# Patient Record
Sex: Female | Born: 1935 | Race: White | Hispanic: No | Smoking: Never smoker
Health system: Southern US, Community
[De-identification: ages and names within clinical notes are randomized; demographics above are authoritative.]

## PROBLEM LIST (undated history)

## (undated) DIAGNOSIS — E785 Hyperlipidemia, unspecified: Secondary | ICD-10-CM

## (undated) DIAGNOSIS — I351 Nonrheumatic aortic (valve) insufficiency: Secondary | ICD-10-CM

## (undated) DIAGNOSIS — G3183 Dementia with Lewy bodies: Secondary | ICD-10-CM

## (undated) DIAGNOSIS — C8589 Other specified types of non-Hodgkin lymphoma, extranodal and solid organ sites: Secondary | ICD-10-CM

## (undated) DIAGNOSIS — C8339 Primary central nervous system lymphoma: Secondary | ICD-10-CM

## (undated) DIAGNOSIS — Z8719 Personal history of other diseases of the digestive system: Secondary | ICD-10-CM

## (undated) DIAGNOSIS — K219 Gastro-esophageal reflux disease without esophagitis: Secondary | ICD-10-CM

## (undated) DIAGNOSIS — R05 Cough: Secondary | ICD-10-CM

## (undated) DIAGNOSIS — Z9181 History of falling: Secondary | ICD-10-CM

## (undated) DIAGNOSIS — Z8639 Personal history of other endocrine, nutritional and metabolic disease: Secondary | ICD-10-CM

## (undated) DIAGNOSIS — E46 Unspecified protein-calorie malnutrition: Secondary | ICD-10-CM

## (undated) DIAGNOSIS — F0280 Dementia in other diseases classified elsewhere without behavioral disturbance: Secondary | ICD-10-CM

## (undated) DIAGNOSIS — C719 Malignant neoplasm of brain, unspecified: Secondary | ICD-10-CM

## (undated) DIAGNOSIS — I639 Cerebral infarction, unspecified: Secondary | ICD-10-CM

## (undated) DIAGNOSIS — I428 Other cardiomyopathies: Secondary | ICD-10-CM

## (undated) DIAGNOSIS — I10 Essential (primary) hypertension: Secondary | ICD-10-CM

## (undated) DIAGNOSIS — R059 Cough, unspecified: Secondary | ICD-10-CM

## (undated) DIAGNOSIS — N289 Disorder of kidney and ureter, unspecified: Secondary | ICD-10-CM

## (undated) DIAGNOSIS — F4321 Adjustment disorder with depressed mood: Secondary | ICD-10-CM

## (undated) DIAGNOSIS — N390 Urinary tract infection, site not specified: Secondary | ICD-10-CM

## (undated) DIAGNOSIS — R5381 Other malaise: Secondary | ICD-10-CM

## (undated) DIAGNOSIS — G459 Transient cerebral ischemic attack, unspecified: Secondary | ICD-10-CM

## (undated) HISTORY — DX: Primary central nervous system lymphoma: C83.390

## (undated) HISTORY — DX: Other specified types of non-hodgkin lymphoma, extranodal and solid organ sites: C85.89

## (undated) HISTORY — PX: OTHER SURGICAL HISTORY: SHX169

## (undated) HISTORY — DX: Disorder of kidney and ureter, unspecified: N28.9

## (undated) HISTORY — DX: Cough, unspecified: R05.9

## (undated) HISTORY — DX: Unspecified protein-calorie malnutrition: E46

## (undated) HISTORY — DX: Other cardiomyopathies: I42.8

## (undated) HISTORY — DX: Other malaise: R53.81

## (undated) HISTORY — DX: Cough: R05

## (undated) HISTORY — DX: Transient cerebral ischemic attack, unspecified: G45.9

## (undated) HISTORY — DX: Adjustment disorder with depressed mood: F43.21

## (undated) HISTORY — PX: TONSILLECTOMY: SUR1361

## (undated) HISTORY — DX: Gastro-esophageal reflux disease without esophagitis: K21.9

## (undated) HISTORY — DX: Cerebral infarction, unspecified: I63.9

## (undated) HISTORY — DX: Personal history of other diseases of the digestive system: Z87.19

## (undated) HISTORY — DX: Hyperlipidemia, unspecified: E78.5

## (undated) HISTORY — DX: Malignant neoplasm of brain, unspecified: C71.9

## (undated) HISTORY — DX: Urinary tract infection, site not specified: N39.0

## (undated) HISTORY — DX: Personal history of other endocrine, nutritional and metabolic disease: Z86.39

## (undated) HISTORY — DX: Dementia in other diseases classified elsewhere, unspecified severity, without behavioral disturbance, psychotic disturbance, mood disturbance, and anxiety: F02.80

## (undated) HISTORY — DX: Nonrheumatic aortic (valve) insufficiency: I35.1

## (undated) HISTORY — DX: Essential (primary) hypertension: I10

## (undated) HISTORY — DX: Dementia with Lewy bodies: G31.83

## (undated) HISTORY — DX: History of falling: Z91.81

---

## 1997-11-08 ENCOUNTER — Emergency Department (HOSPITAL_COMMUNITY): Admission: EM | Admit: 1997-11-08 | Discharge: 1997-11-08 | Payer: Self-pay | Admitting: Emergency Medicine

## 1998-11-26 ENCOUNTER — Emergency Department (HOSPITAL_COMMUNITY): Admission: EM | Admit: 1998-11-26 | Discharge: 1998-11-26 | Payer: Self-pay | Admitting: Emergency Medicine

## 1999-01-31 ENCOUNTER — Ambulatory Visit (HOSPITAL_COMMUNITY): Admission: RE | Admit: 1999-01-31 | Discharge: 1999-01-31 | Payer: Self-pay | Admitting: Internal Medicine

## 1999-01-31 ENCOUNTER — Encounter: Payer: Self-pay | Admitting: Internal Medicine

## 2001-07-02 ENCOUNTER — Other Ambulatory Visit: Admission: RE | Admit: 2001-07-02 | Discharge: 2001-07-02 | Payer: Self-pay | Admitting: *Deleted

## 2004-04-12 ENCOUNTER — Ambulatory Visit: Payer: Self-pay | Admitting: Internal Medicine

## 2004-10-14 ENCOUNTER — Ambulatory Visit: Payer: Self-pay | Admitting: Family Medicine

## 2004-10-21 ENCOUNTER — Ambulatory Visit: Payer: Self-pay | Admitting: Family Medicine

## 2004-10-28 ENCOUNTER — Ambulatory Visit: Payer: Self-pay | Admitting: Cardiology

## 2004-11-10 ENCOUNTER — Ambulatory Visit: Payer: Self-pay

## 2005-01-23 HISTORY — PX: AORTIC VALVE REPLACEMENT: SHX41

## 2005-02-06 ENCOUNTER — Ambulatory Visit: Payer: Self-pay | Admitting: Cardiology

## 2005-02-10 ENCOUNTER — Ambulatory Visit: Payer: Self-pay | Admitting: Cardiology

## 2005-02-17 ENCOUNTER — Ambulatory Visit: Payer: Self-pay | Admitting: Cardiology

## 2005-02-21 ENCOUNTER — Ambulatory Visit: Payer: Self-pay

## 2005-03-27 ENCOUNTER — Ambulatory Visit: Payer: Self-pay | Admitting: Internal Medicine

## 2005-03-29 ENCOUNTER — Ambulatory Visit: Payer: Self-pay | Admitting: Gastroenterology

## 2005-03-30 ENCOUNTER — Ambulatory Visit: Payer: Self-pay | Admitting: Gastroenterology

## 2005-04-03 ENCOUNTER — Ambulatory Visit: Payer: Self-pay | Admitting: Gastroenterology

## 2005-04-19 ENCOUNTER — Ambulatory Visit: Payer: Self-pay | Admitting: Gastroenterology

## 2005-04-25 ENCOUNTER — Encounter: Admission: RE | Admit: 2005-04-25 | Discharge: 2005-04-25 | Payer: Self-pay | Admitting: Internal Medicine

## 2005-05-05 ENCOUNTER — Ambulatory Visit: Payer: Self-pay | Admitting: Internal Medicine

## 2005-05-10 ENCOUNTER — Ambulatory Visit: Payer: Self-pay | Admitting: *Deleted

## 2005-05-15 ENCOUNTER — Ambulatory Visit: Payer: Self-pay | Admitting: Internal Medicine

## 2005-05-20 ENCOUNTER — Encounter: Admission: RE | Admit: 2005-05-20 | Discharge: 2005-05-20 | Payer: Self-pay | Admitting: Internal Medicine

## 2005-05-22 ENCOUNTER — Ambulatory Visit: Payer: Self-pay | Admitting: Internal Medicine

## 2005-05-29 ENCOUNTER — Ambulatory Visit: Payer: Self-pay | Admitting: Internal Medicine

## 2005-06-23 ENCOUNTER — Ambulatory Visit: Payer: Self-pay | Admitting: Internal Medicine

## 2005-06-26 ENCOUNTER — Ambulatory Visit: Payer: Self-pay

## 2005-06-26 ENCOUNTER — Encounter: Payer: Self-pay | Admitting: Internal Medicine

## 2005-07-18 ENCOUNTER — Ambulatory Visit: Payer: Self-pay | Admitting: Internal Medicine

## 2005-08-02 ENCOUNTER — Ambulatory Visit: Payer: Self-pay | Admitting: *Deleted

## 2005-08-07 ENCOUNTER — Ambulatory Visit (HOSPITAL_COMMUNITY): Admission: RE | Admit: 2005-08-07 | Discharge: 2005-08-07 | Payer: Self-pay | Admitting: *Deleted

## 2005-08-07 ENCOUNTER — Ambulatory Visit: Payer: Self-pay | Admitting: Internal Medicine

## 2005-08-28 ENCOUNTER — Ambulatory Visit: Payer: Self-pay | Admitting: *Deleted

## 2005-08-29 ENCOUNTER — Ambulatory Visit: Payer: Self-pay | Admitting: Gastroenterology

## 2005-09-11 ENCOUNTER — Ambulatory Visit: Payer: Self-pay | Admitting: *Deleted

## 2005-09-18 ENCOUNTER — Ambulatory Visit: Payer: Self-pay | Admitting: Gastroenterology

## 2005-09-19 ENCOUNTER — Ambulatory Visit: Payer: Self-pay | Admitting: Gastroenterology

## 2005-09-21 ENCOUNTER — Ambulatory Visit (HOSPITAL_COMMUNITY): Admission: RE | Admit: 2005-09-21 | Discharge: 2005-09-21 | Payer: Self-pay | Admitting: Gastroenterology

## 2005-09-29 ENCOUNTER — Ambulatory Visit: Payer: Self-pay | Admitting: Gastroenterology

## 2005-10-12 ENCOUNTER — Inpatient Hospital Stay (HOSPITAL_COMMUNITY): Admission: EM | Admit: 2005-10-12 | Discharge: 2005-10-14 | Payer: Self-pay | Admitting: Emergency Medicine

## 2005-10-12 ENCOUNTER — Ambulatory Visit: Payer: Self-pay | Admitting: *Deleted

## 2005-10-13 ENCOUNTER — Encounter: Payer: Self-pay | Admitting: Cardiology

## 2005-10-14 ENCOUNTER — Ambulatory Visit: Payer: Self-pay | Admitting: Internal Medicine

## 2005-10-23 ENCOUNTER — Ambulatory Visit: Payer: Self-pay | Admitting: *Deleted

## 2005-10-23 DIAGNOSIS — I351 Nonrheumatic aortic (valve) insufficiency: Secondary | ICD-10-CM

## 2005-10-23 HISTORY — DX: Nonrheumatic aortic (valve) insufficiency: I35.1

## 2005-10-30 ENCOUNTER — Ambulatory Visit: Payer: Self-pay | Admitting: Cardiovascular Disease

## 2005-10-31 ENCOUNTER — Ambulatory Visit: Payer: Self-pay | Admitting: Internal Medicine

## 2005-11-01 ENCOUNTER — Ambulatory Visit: Payer: Self-pay | Admitting: Internal Medicine

## 2005-11-01 ENCOUNTER — Ambulatory Visit (HOSPITAL_COMMUNITY): Admission: RE | Admit: 2005-11-01 | Discharge: 2005-11-01 | Payer: Self-pay | Admitting: Internal Medicine

## 2005-11-01 ENCOUNTER — Encounter: Payer: Self-pay | Admitting: Internal Medicine

## 2005-11-02 ENCOUNTER — Ambulatory Visit (HOSPITAL_COMMUNITY): Admission: RE | Admit: 2005-11-02 | Discharge: 2005-11-02 | Payer: Self-pay | Admitting: Gastroenterology

## 2005-11-07 ENCOUNTER — Ambulatory Visit: Payer: Self-pay | Admitting: Internal Medicine

## 2005-11-07 ENCOUNTER — Inpatient Hospital Stay (HOSPITAL_COMMUNITY): Admission: AD | Admit: 2005-11-07 | Discharge: 2005-11-14 | Payer: Self-pay | Admitting: Internal Medicine

## 2005-11-07 ENCOUNTER — Inpatient Hospital Stay (HOSPITAL_BASED_OUTPATIENT_CLINIC_OR_DEPARTMENT_OTHER): Admission: RE | Admit: 2005-11-07 | Discharge: 2005-11-07 | Payer: Self-pay | Admitting: Internal Medicine

## 2005-11-08 ENCOUNTER — Encounter: Payer: Self-pay | Admitting: Cardiothoracic Surgery

## 2005-11-22 ENCOUNTER — Ambulatory Visit: Payer: Self-pay | Admitting: *Deleted

## 2005-11-29 ENCOUNTER — Ambulatory Visit: Payer: Self-pay | Admitting: *Deleted

## 2005-12-01 ENCOUNTER — Encounter: Admission: RE | Admit: 2005-12-01 | Discharge: 2005-12-01 | Payer: Self-pay | Admitting: Cardiothoracic Surgery

## 2005-12-07 ENCOUNTER — Ambulatory Visit: Payer: Self-pay

## 2005-12-07 ENCOUNTER — Ambulatory Visit: Payer: Self-pay | Admitting: *Deleted

## 2005-12-07 ENCOUNTER — Encounter: Payer: Self-pay | Admitting: Cardiology

## 2005-12-12 ENCOUNTER — Encounter (HOSPITAL_COMMUNITY): Admission: RE | Admit: 2005-12-12 | Discharge: 2006-03-12 | Payer: Self-pay | Admitting: *Deleted

## 2005-12-21 ENCOUNTER — Ambulatory Visit: Payer: Self-pay | Admitting: *Deleted

## 2005-12-21 ENCOUNTER — Encounter: Payer: Self-pay | Admitting: Cardiology

## 2005-12-21 ENCOUNTER — Ambulatory Visit: Payer: Self-pay

## 2006-01-01 ENCOUNTER — Ambulatory Visit: Payer: Self-pay

## 2006-01-01 ENCOUNTER — Encounter: Payer: Self-pay | Admitting: Cardiology

## 2006-01-01 ENCOUNTER — Ambulatory Visit: Payer: Self-pay | Admitting: Cardiology

## 2006-01-09 ENCOUNTER — Ambulatory Visit: Payer: Self-pay | Admitting: *Deleted

## 2006-01-10 ENCOUNTER — Ambulatory Visit: Payer: Self-pay | Admitting: Gastroenterology

## 2006-02-01 ENCOUNTER — Encounter: Admission: RE | Admit: 2006-02-01 | Discharge: 2006-02-01 | Payer: Self-pay | Admitting: Cardiothoracic Surgery

## 2006-02-13 ENCOUNTER — Ambulatory Visit: Payer: Self-pay | Admitting: *Deleted

## 2006-02-20 ENCOUNTER — Ambulatory Visit: Payer: Self-pay | Admitting: Gastroenterology

## 2006-03-12 ENCOUNTER — Ambulatory Visit: Payer: Self-pay | Admitting: Internal Medicine

## 2006-03-12 ENCOUNTER — Inpatient Hospital Stay (HOSPITAL_COMMUNITY): Admission: EM | Admit: 2006-03-12 | Discharge: 2006-03-17 | Payer: Self-pay | Admitting: Emergency Medicine

## 2006-03-12 ENCOUNTER — Ambulatory Visit: Payer: Self-pay | Admitting: Cardiology

## 2006-03-13 ENCOUNTER — Encounter: Payer: Self-pay | Admitting: Internal Medicine

## 2006-03-13 ENCOUNTER — Encounter: Payer: Self-pay | Admitting: Cardiology

## 2006-03-13 ENCOUNTER — Encounter (INDEPENDENT_AMBULATORY_CARE_PROVIDER_SITE_OTHER): Payer: Self-pay | Admitting: *Deleted

## 2006-03-13 ENCOUNTER — Ambulatory Visit: Payer: Self-pay | Admitting: Cardiothoracic Surgery

## 2006-03-13 LAB — CONVERTED CEMR LAB
Hemoglobin, Urine: NEGATIVE
Nitrite: NEGATIVE
Urobilinogen, UA: 0.2 (ref 0.0–1.0)

## 2006-03-14 ENCOUNTER — Encounter: Payer: Self-pay | Admitting: Internal Medicine

## 2006-03-14 ENCOUNTER — Encounter (INDEPENDENT_AMBULATORY_CARE_PROVIDER_SITE_OTHER): Payer: Self-pay | Admitting: Specialist

## 2006-03-29 ENCOUNTER — Ambulatory Visit: Payer: Self-pay

## 2006-03-29 ENCOUNTER — Ambulatory Visit: Payer: Self-pay | Admitting: *Deleted

## 2006-03-29 ENCOUNTER — Encounter: Payer: Self-pay | Admitting: Cardiology

## 2006-04-02 ENCOUNTER — Ambulatory Visit: Payer: Self-pay | Admitting: *Deleted

## 2006-04-02 ENCOUNTER — Ambulatory Visit: Payer: Self-pay | Admitting: Gastroenterology

## 2006-04-02 LAB — CONVERTED CEMR LAB
CO2: 34 meq/L — ABNORMAL HIGH (ref 19–32)
Chloride: 96 meq/L (ref 96–112)
Glucose, Bld: 169 mg/dL — ABNORMAL HIGH (ref 70–99)

## 2006-04-03 ENCOUNTER — Ambulatory Visit: Payer: Self-pay | Admitting: Internal Medicine

## 2006-04-04 ENCOUNTER — Ambulatory Visit: Payer: Self-pay | Admitting: Cardiothoracic Surgery

## 2006-04-06 ENCOUNTER — Encounter: Admission: RE | Admit: 2006-04-06 | Discharge: 2006-04-06 | Payer: Self-pay | Admitting: Cardiothoracic Surgery

## 2006-04-09 ENCOUNTER — Ambulatory Visit: Payer: Self-pay | Admitting: Cardiology

## 2006-04-09 ENCOUNTER — Inpatient Hospital Stay (HOSPITAL_COMMUNITY): Admission: RE | Admit: 2006-04-09 | Discharge: 2006-04-13 | Payer: Self-pay | Admitting: Cardiothoracic Surgery

## 2006-04-25 ENCOUNTER — Ambulatory Visit: Payer: Self-pay | Admitting: *Deleted

## 2006-04-25 LAB — CONVERTED CEMR LAB
BUN: 24 mg/dL — ABNORMAL HIGH (ref 6–23)
CO2: 31 meq/L (ref 19–32)
Calcium: 9.1 mg/dL (ref 8.4–10.5)
Chloride: 100 meq/L (ref 96–112)
Glucose, Bld: 110 mg/dL — ABNORMAL HIGH (ref 70–99)

## 2006-05-04 DIAGNOSIS — I10 Essential (primary) hypertension: Secondary | ICD-10-CM

## 2006-05-22 ENCOUNTER — Ambulatory Visit: Payer: Self-pay | Admitting: *Deleted

## 2006-05-22 ENCOUNTER — Encounter: Payer: Self-pay | Admitting: Cardiology

## 2006-05-22 ENCOUNTER — Ambulatory Visit: Payer: Self-pay

## 2006-05-24 ENCOUNTER — Ambulatory Visit: Payer: Self-pay | Admitting: *Deleted

## 2006-05-24 LAB — CONVERTED CEMR LAB
BUN: 19 mg/dL (ref 6–23)
CO2: 32 meq/L (ref 19–32)
Calcium: 9.2 mg/dL (ref 8.4–10.5)
Chloride: 101 meq/L (ref 96–112)
GFR calc Af Amer: 61 mL/min
GFR calc non Af Amer: 50 mL/min
Glucose, Bld: 83 mg/dL (ref 70–99)
Hemoglobin: 12.2 g/dL (ref 12.0–15.0)
Pro B Natriuretic peptide (BNP): 603 pg/mL — ABNORMAL HIGH (ref 0.0–100.0)

## 2006-05-31 ENCOUNTER — Ambulatory Visit: Payer: Self-pay | Admitting: Internal Medicine

## 2006-06-07 ENCOUNTER — Ambulatory Visit: Payer: Self-pay | Admitting: Cardiothoracic Surgery

## 2006-06-27 ENCOUNTER — Ambulatory Visit: Payer: Self-pay | Admitting: *Deleted

## 2006-06-27 LAB — CONVERTED CEMR LAB
BUN: 32 mg/dL — ABNORMAL HIGH (ref 6–23)
Chloride: 102 meq/L (ref 96–112)
Creatinine, Ser: 1.3 mg/dL — ABNORMAL HIGH (ref 0.4–1.2)
GFR calc non Af Amer: 41 mL/min
HCT: 34.4 % — ABNORMAL LOW (ref 36.0–46.0)
Hemoglobin: 11.8 g/dL — ABNORMAL LOW (ref 12.0–15.0)
Potassium: 4.6 meq/L (ref 3.5–5.1)
Sodium: 136 meq/L (ref 135–145)

## 2006-06-29 ENCOUNTER — Ambulatory Visit: Payer: Self-pay | Admitting: Internal Medicine

## 2006-06-29 DIAGNOSIS — D518 Other vitamin B12 deficiency anemias: Secondary | ICD-10-CM

## 2006-07-24 ENCOUNTER — Ambulatory Visit: Payer: Self-pay | Admitting: *Deleted

## 2006-07-24 LAB — CONVERTED CEMR LAB
Chloride: 110 meq/L (ref 96–112)
GFR calc Af Amer: 55 mL/min
GFR calc non Af Amer: 45 mL/min
Glucose, Bld: 98 mg/dL (ref 70–99)
Sodium: 139 meq/L (ref 135–145)

## 2006-07-25 ENCOUNTER — Encounter: Payer: Self-pay | Admitting: Cardiology

## 2006-07-25 ENCOUNTER — Ambulatory Visit: Payer: Self-pay | Admitting: Internal Medicine

## 2006-07-25 ENCOUNTER — Ambulatory Visit: Payer: Self-pay

## 2006-08-07 ENCOUNTER — Ambulatory Visit: Payer: Self-pay | Admitting: Internal Medicine

## 2006-08-30 ENCOUNTER — Ambulatory Visit: Payer: Self-pay | Admitting: Cardiothoracic Surgery

## 2006-09-07 ENCOUNTER — Ambulatory Visit: Payer: Self-pay | Admitting: Internal Medicine

## 2006-09-25 ENCOUNTER — Ambulatory Visit: Payer: Self-pay | Admitting: Internal Medicine

## 2006-09-25 DIAGNOSIS — I359 Nonrheumatic aortic valve disorder, unspecified: Secondary | ICD-10-CM | POA: Insufficient documentation

## 2006-09-25 DIAGNOSIS — K649 Unspecified hemorrhoids: Secondary | ICD-10-CM | POA: Insufficient documentation

## 2006-09-28 LAB — CONVERTED CEMR LAB
Basophils Relative: 0.7 % (ref 0.0–1.0)
Eosinophils Relative: 6.6 % — ABNORMAL HIGH (ref 0.0–5.0)
Monocytes Relative: 5.8 % (ref 3.0–11.0)
Neutro Abs: 3.9 10*3/uL (ref 1.4–7.7)
Platelets: 249 10*3/uL (ref 150–400)
RBC: 3.6 M/uL — ABNORMAL LOW (ref 3.87–5.11)
RDW: 13.4 % (ref 11.5–14.6)
Vitamin B-12: 473 pg/mL (ref 211–911)
WBC: 5.1 10*3/uL (ref 4.5–10.5)

## 2006-10-05 ENCOUNTER — Ambulatory Visit: Payer: Self-pay | Admitting: Internal Medicine

## 2006-10-22 ENCOUNTER — Telehealth: Payer: Self-pay | Admitting: Internal Medicine

## 2006-10-23 ENCOUNTER — Ambulatory Visit: Payer: Self-pay | Admitting: Internal Medicine

## 2006-10-24 ENCOUNTER — Telehealth: Payer: Self-pay | Admitting: Internal Medicine

## 2006-10-24 ENCOUNTER — Encounter: Payer: Self-pay | Admitting: Internal Medicine

## 2006-10-24 LAB — CONVERTED CEMR LAB
ALT: 15 units/L (ref 0–35)
AST: 20 units/L (ref 0–37)
Amylase: 87 units/L (ref 27–131)
Basophils Relative: 0 % (ref 0.0–1.0)
Bilirubin, Direct: 0.2 mg/dL (ref 0.0–0.3)
CO2: 33 meq/L — ABNORMAL HIGH (ref 19–32)
Calcium: 10.2 mg/dL (ref 8.4–10.5)
Eosinophils Relative: 5.4 % — ABNORMAL HIGH (ref 0.0–5.0)
GFR calc Af Amer: 61 mL/min
Glucose, Bld: 111 mg/dL — ABNORMAL HIGH (ref 70–99)
HCT: 37.9 % (ref 36.0–46.0)
Hemoglobin: 12.8 g/dL (ref 12.0–15.0)
Lymphocytes Relative: 17.2 % (ref 12.0–46.0)
MCV: 97.4 fL (ref 78.0–100.0)
Neutro Abs: 3.7 10*3/uL (ref 1.4–7.7)
Neutrophils Relative %: 65.4 % (ref 43.0–77.0)
Platelets: 225 10*3/uL (ref 150–400)
Sodium: 141 meq/L (ref 135–145)
Total Protein: 7.5 g/dL (ref 6.0–8.3)
WBC: 5.7 10*3/uL (ref 4.5–10.5)

## 2006-10-26 ENCOUNTER — Encounter: Admission: RE | Admit: 2006-10-26 | Discharge: 2006-10-26 | Payer: Self-pay | Admitting: Internal Medicine

## 2006-11-08 ENCOUNTER — Ambulatory Visit: Payer: Self-pay | Admitting: Pulmonary Disease

## 2006-11-12 ENCOUNTER — Encounter: Admission: RE | Admit: 2006-11-12 | Discharge: 2006-11-12 | Payer: Self-pay | Admitting: Internal Medicine

## 2006-11-13 ENCOUNTER — Ambulatory Visit: Payer: Self-pay | Admitting: Gastroenterology

## 2006-11-19 ENCOUNTER — Telehealth (INDEPENDENT_AMBULATORY_CARE_PROVIDER_SITE_OTHER): Payer: Self-pay | Admitting: *Deleted

## 2006-11-29 ENCOUNTER — Ambulatory Visit (HOSPITAL_COMMUNITY): Admission: RE | Admit: 2006-11-29 | Discharge: 2006-11-29 | Payer: Self-pay | Admitting: Gastroenterology

## 2006-11-29 ENCOUNTER — Encounter: Payer: Self-pay | Admitting: Gastroenterology

## 2006-11-29 ENCOUNTER — Encounter: Payer: Self-pay | Admitting: Internal Medicine

## 2006-12-04 ENCOUNTER — Ambulatory Visit: Payer: Self-pay | Admitting: Gastroenterology

## 2006-12-06 ENCOUNTER — Ambulatory Visit: Payer: Self-pay | Admitting: Gastroenterology

## 2006-12-06 LAB — CONVERTED CEMR LAB: BUN: 28 mg/dL — ABNORMAL HIGH (ref 6–23)

## 2006-12-09 ENCOUNTER — Encounter: Admission: RE | Admit: 2006-12-09 | Discharge: 2006-12-09 | Payer: Self-pay | Admitting: Gastroenterology

## 2007-01-18 ENCOUNTER — Encounter: Payer: Self-pay | Admitting: Internal Medicine

## 2007-01-21 ENCOUNTER — Encounter: Payer: Self-pay | Admitting: Internal Medicine

## 2007-01-28 ENCOUNTER — Encounter: Payer: Self-pay | Admitting: Internal Medicine

## 2007-02-27 ENCOUNTER — Ambulatory Visit: Payer: Self-pay | Admitting: Internal Medicine

## 2007-02-27 LAB — CONVERTED CEMR LAB
Chloride: 100 meq/L (ref 96–112)
Creatinine, Ser: 1.3 mg/dL — ABNORMAL HIGH (ref 0.4–1.2)
Glucose, Bld: 98 mg/dL (ref 70–99)
Potassium: 4.5 meq/L (ref 3.5–5.1)
Sodium: 138 meq/L (ref 135–145)

## 2007-03-12 ENCOUNTER — Ambulatory Visit: Payer: Self-pay | Admitting: Internal Medicine

## 2007-03-13 ENCOUNTER — Ambulatory Visit: Payer: Self-pay

## 2007-03-13 ENCOUNTER — Encounter: Payer: Self-pay | Admitting: Internal Medicine

## 2007-03-15 ENCOUNTER — Telehealth (INDEPENDENT_AMBULATORY_CARE_PROVIDER_SITE_OTHER): Payer: Self-pay | Admitting: *Deleted

## 2007-03-18 DIAGNOSIS — F411 Generalized anxiety disorder: Secondary | ICD-10-CM | POA: Insufficient documentation

## 2007-03-18 DIAGNOSIS — D510 Vitamin B12 deficiency anemia due to intrinsic factor deficiency: Secondary | ICD-10-CM

## 2007-03-18 DIAGNOSIS — K589 Irritable bowel syndrome without diarrhea: Secondary | ICD-10-CM

## 2007-03-18 DIAGNOSIS — R0602 Shortness of breath: Secondary | ICD-10-CM

## 2007-03-18 DIAGNOSIS — F329 Major depressive disorder, single episode, unspecified: Secondary | ICD-10-CM

## 2007-03-18 DIAGNOSIS — E785 Hyperlipidemia, unspecified: Secondary | ICD-10-CM

## 2007-03-28 ENCOUNTER — Encounter: Payer: Self-pay | Admitting: Internal Medicine

## 2007-04-01 ENCOUNTER — Encounter: Payer: Self-pay | Admitting: Internal Medicine

## 2007-04-09 ENCOUNTER — Encounter: Payer: Self-pay | Admitting: Internal Medicine

## 2007-04-09 ENCOUNTER — Encounter: Admission: RE | Admit: 2007-04-09 | Discharge: 2007-07-08 | Payer: Self-pay | Admitting: Internal Medicine

## 2007-04-23 ENCOUNTER — Ambulatory Visit: Payer: Self-pay | Admitting: Critical Care Medicine

## 2007-05-17 ENCOUNTER — Ambulatory Visit: Payer: Self-pay | Admitting: Internal Medicine

## 2007-05-17 ENCOUNTER — Telehealth (INDEPENDENT_AMBULATORY_CARE_PROVIDER_SITE_OTHER): Payer: Self-pay | Admitting: *Deleted

## 2007-06-14 ENCOUNTER — Ambulatory Visit: Payer: Self-pay | Admitting: Internal Medicine

## 2007-08-15 ENCOUNTER — Ambulatory Visit: Payer: Self-pay | Admitting: Internal Medicine

## 2007-09-16 ENCOUNTER — Ambulatory Visit: Payer: Self-pay | Admitting: Internal Medicine

## 2007-09-17 ENCOUNTER — Ambulatory Visit: Payer: Self-pay | Admitting: Internal Medicine

## 2007-09-17 LAB — CONVERTED CEMR LAB
Albumin: 3.6 g/dL (ref 3.5–5.2)
Alkaline Phosphatase: 73 units/L (ref 39–117)
Bilirubin, Direct: 0.1 mg/dL (ref 0.0–0.3)
GFR calc Af Amer: 61 mL/min
GFR calc non Af Amer: 50 mL/min
LDL Cholesterol: 92 mg/dL (ref 0–99)
Potassium: 4.5 meq/L (ref 3.5–5.1)
Sodium: 139 meq/L (ref 135–145)
Total Bilirubin: 1.2 mg/dL (ref 0.3–1.2)
VLDL: 24 mg/dL (ref 0–40)

## 2007-09-19 ENCOUNTER — Ambulatory Visit: Payer: Self-pay | Admitting: Internal Medicine

## 2007-10-31 ENCOUNTER — Ambulatory Visit: Payer: Self-pay | Admitting: Internal Medicine

## 2008-02-13 ENCOUNTER — Ambulatory Visit: Payer: Self-pay | Admitting: Internal Medicine

## 2008-03-06 ENCOUNTER — Ambulatory Visit: Payer: Self-pay | Admitting: Internal Medicine

## 2008-03-23 ENCOUNTER — Ambulatory Visit: Payer: Self-pay | Admitting: Internal Medicine

## 2008-03-23 LAB — CONVERTED CEMR LAB
HCT: 38.1 % (ref 36.0–46.0)
Hemoglobin: 13 g/dL (ref 12.0–15.0)
MCHC: 34.2 g/dL (ref 30.0–36.0)
MCV: 97.8 fL (ref 78.0–100.0)
Monocytes Absolute: 0.6 10*3/uL (ref 0.1–1.0)
Monocytes Relative: 10.3 % (ref 3.0–12.0)
Neutro Abs: 4.1 10*3/uL (ref 1.4–7.7)
RDW: 12.5 % (ref 11.5–14.6)
Vitamin B-12: >1500 pg/mL — ABNORMAL HIGH (ref 211–911)

## 2008-04-11 DIAGNOSIS — J310 Chronic rhinitis: Secondary | ICD-10-CM | POA: Insufficient documentation

## 2008-05-25 ENCOUNTER — Ambulatory Visit: Payer: Self-pay | Admitting: Internal Medicine

## 2008-05-26 ENCOUNTER — Ambulatory Visit: Payer: Self-pay | Admitting: Internal Medicine

## 2008-06-10 ENCOUNTER — Ambulatory Visit: Payer: Self-pay | Admitting: Internal Medicine

## 2008-06-16 ENCOUNTER — Encounter: Payer: Self-pay | Admitting: Internal Medicine

## 2008-06-24 ENCOUNTER — Telehealth: Payer: Self-pay | Admitting: Internal Medicine

## 2008-06-29 ENCOUNTER — Ambulatory Visit: Payer: Self-pay

## 2008-06-29 ENCOUNTER — Encounter: Payer: Self-pay | Admitting: Internal Medicine

## 2008-06-29 ENCOUNTER — Ambulatory Visit: Payer: Self-pay | Admitting: Internal Medicine

## 2008-06-30 ENCOUNTER — Telehealth: Payer: Self-pay | Admitting: Internal Medicine

## 2008-06-30 ENCOUNTER — Ambulatory Visit: Payer: Self-pay | Admitting: Internal Medicine

## 2008-06-30 ENCOUNTER — Ambulatory Visit: Payer: Self-pay | Admitting: Pulmonary Disease

## 2008-06-30 LAB — CONVERTED CEMR LAB
Basophils Absolute: 0 10*3/uL (ref 0.0–0.1)
CO2: 28 meq/L (ref 19–32)
Calcium: 9 mg/dL (ref 8.4–10.5)
Creatinine, Ser: 1.5 mg/dL — ABNORMAL HIGH (ref 0.4–1.2)
Eosinophils Absolute: 0.2 10*3/uL (ref 0.0–0.7)
Eosinophils Relative: 2.9 % (ref 0.0–5.0)
MCHC: 34.8 g/dL (ref 30.0–36.0)
MCV: 98.6 fL (ref 78.0–100.0)
Monocytes Absolute: 0.6 10*3/uL (ref 0.1–1.0)
Neutrophils Relative %: 71 % (ref 43.0–77.0)
Platelets: 192 10*3/uL (ref 150.0–400.0)
WBC: 5.4 10*3/uL (ref 4.5–10.5)

## 2008-07-17 ENCOUNTER — Encounter: Payer: Self-pay | Admitting: Internal Medicine

## 2008-07-20 ENCOUNTER — Ambulatory Visit: Payer: Self-pay | Admitting: Internal Medicine

## 2008-07-20 LAB — CONVERTED CEMR LAB
BUN: 34 mg/dL — ABNORMAL HIGH (ref 6–23)
Chloride: 104 meq/L (ref 96–112)
Creatinine, Ser: 1.2 mg/dL (ref 0.4–1.2)
Glucose, Bld: 97 mg/dL (ref 70–99)

## 2008-07-21 ENCOUNTER — Encounter: Payer: Self-pay | Admitting: Internal Medicine

## 2008-07-21 ENCOUNTER — Encounter: Admission: RE | Admit: 2008-07-21 | Discharge: 2008-07-21 | Payer: Self-pay | Admitting: Interventional Radiology

## 2008-08-17 ENCOUNTER — Encounter: Payer: Self-pay | Admitting: Internal Medicine

## 2008-09-09 ENCOUNTER — Encounter: Payer: Self-pay | Admitting: Internal Medicine

## 2008-09-30 ENCOUNTER — Telehealth: Payer: Self-pay | Admitting: Internal Medicine

## 2008-10-02 ENCOUNTER — Ambulatory Visit: Payer: Self-pay | Admitting: Internal Medicine

## 2008-10-07 ENCOUNTER — Telehealth: Payer: Self-pay | Admitting: Internal Medicine

## 2008-10-13 LAB — CONVERTED CEMR LAB
Alkaline Phosphatase: 67 units/L (ref 39–117)
Bilirubin, Direct: 0.1 mg/dL (ref 0.0–0.3)
Cholesterol: 140 mg/dL (ref 0–200)
Folate: >20 ng/mL
LDL Cholesterol: 85 mg/dL (ref 0–99)
Total Bilirubin: 1.3 mg/dL — ABNORMAL HIGH (ref 0.3–1.2)
Total Protein: 7.1 g/dL (ref 6.0–8.3)

## 2008-10-16 ENCOUNTER — Telehealth: Payer: Self-pay | Admitting: Internal Medicine

## 2008-10-18 ENCOUNTER — Encounter: Payer: Self-pay | Admitting: Internal Medicine

## 2008-10-19 ENCOUNTER — Encounter: Payer: Self-pay | Admitting: Internal Medicine

## 2008-10-22 ENCOUNTER — Ambulatory Visit: Payer: Self-pay | Admitting: Internal Medicine

## 2008-10-22 LAB — CONVERTED CEMR LAB
Basophils Relative: 0.4 % (ref 0.0–3.0)
CO2: 29 meq/L (ref 19–32)
Calcium: 9.2 mg/dL (ref 8.4–10.5)
Chloride: 99 meq/L (ref 96–112)
Eosinophils Absolute: 0.9 10*3/uL — ABNORMAL HIGH (ref 0.0–0.7)
Eosinophils Relative: 12.3 % — ABNORMAL HIGH (ref 0.0–5.0)
Glucose, Bld: 99 mg/dL (ref 70–99)
HCT: 40.5 % (ref 36.0–46.0)
Hemoglobin: 13.9 g/dL (ref 12.0–15.0)
Lymphs Abs: 1.2 10*3/uL (ref 0.7–4.0)
MCHC: 34.3 g/dL (ref 30.0–36.0)
MCV: 100.6 fL — ABNORMAL HIGH (ref 78.0–100.0)
Monocytes Absolute: 0.7 10*3/uL (ref 0.1–1.0)
Neutro Abs: 4.4 10*3/uL (ref 1.4–7.7)
Potassium: 4.4 meq/L (ref 3.5–5.1)
RBC: 4.03 M/uL (ref 3.87–5.11)
Sodium: 136 meq/L (ref 135–145)
WBC: 7.2 10*3/uL (ref 4.5–10.5)

## 2008-10-23 DIAGNOSIS — G459 Transient cerebral ischemic attack, unspecified: Secondary | ICD-10-CM

## 2008-10-23 HISTORY — DX: Transient cerebral ischemic attack, unspecified: G45.9

## 2008-10-30 ENCOUNTER — Ambulatory Visit: Payer: Self-pay

## 2008-10-30 ENCOUNTER — Ambulatory Visit (HOSPITAL_COMMUNITY): Admission: RE | Admit: 2008-10-30 | Discharge: 2008-10-30 | Payer: Self-pay | Admitting: Internal Medicine

## 2008-10-30 ENCOUNTER — Encounter: Payer: Self-pay | Admitting: Internal Medicine

## 2008-10-30 ENCOUNTER — Ambulatory Visit: Payer: Self-pay | Admitting: Cardiology

## 2008-10-30 ENCOUNTER — Ambulatory Visit: Payer: Self-pay | Admitting: Internal Medicine

## 2008-11-02 ENCOUNTER — Encounter: Payer: Self-pay | Admitting: Internal Medicine

## 2008-11-09 ENCOUNTER — Ambulatory Visit: Payer: Self-pay | Admitting: Internal Medicine

## 2008-11-10 ENCOUNTER — Telehealth (INDEPENDENT_AMBULATORY_CARE_PROVIDER_SITE_OTHER): Payer: Self-pay | Admitting: *Deleted

## 2008-11-10 ENCOUNTER — Telehealth: Payer: Self-pay | Admitting: Internal Medicine

## 2008-11-11 ENCOUNTER — Inpatient Hospital Stay (HOSPITAL_COMMUNITY): Admission: EM | Admit: 2008-11-11 | Discharge: 2008-11-14 | Payer: Self-pay | Admitting: Emergency Medicine

## 2008-11-12 ENCOUNTER — Encounter (INDEPENDENT_AMBULATORY_CARE_PROVIDER_SITE_OTHER): Payer: Self-pay | Admitting: Family Medicine

## 2008-12-04 ENCOUNTER — Encounter: Admission: RE | Admit: 2008-12-04 | Discharge: 2009-01-20 | Payer: Self-pay | Admitting: Family Medicine

## 2008-12-19 ENCOUNTER — Encounter: Payer: Self-pay | Admitting: Internal Medicine

## 2009-01-19 ENCOUNTER — Encounter: Payer: Self-pay | Admitting: Internal Medicine

## 2009-02-19 ENCOUNTER — Encounter: Payer: Self-pay | Admitting: Internal Medicine

## 2009-03-02 ENCOUNTER — Ambulatory Visit: Payer: Self-pay | Admitting: Internal Medicine

## 2009-03-22 ENCOUNTER — Encounter: Payer: Self-pay | Admitting: Internal Medicine

## 2009-03-23 ENCOUNTER — Encounter: Payer: Self-pay | Admitting: Internal Medicine

## 2009-03-23 LAB — CONVERTED CEMR LAB
Albumin: 4.2 g/dL (ref 3.5–5.2)
Alkaline Phosphatase: 66 units/L (ref 39–117)
Basophils Absolute: 0 10*3/uL (ref 0.0–0.1)
Basophils Relative: 0.3 % (ref 0.0–3.0)
Bilirubin, Direct: 0.2 mg/dL (ref 0.0–0.3)
CO2: 29 meq/L (ref 19–32)
Calcium: 9.6 mg/dL (ref 8.4–10.5)
Creatinine, Ser: 1.3 mg/dL — ABNORMAL HIGH (ref 0.4–1.2)
Eosinophils Absolute: 0.3 10*3/uL (ref 0.0–0.7)
Free T4: 0.6 ng/dL (ref 0.6–1.6)
GFR calc non Af Amer: 41.1 mL/min (ref >60)
Glucose, Bld: 95 mg/dL (ref 70–99)
Lymphocytes Relative: 14.1 % (ref 12.0–46.0)
MCHC: 34 g/dL (ref 30.0–36.0)
MCV: 100.6 fL — ABNORMAL HIGH (ref 78.0–100.0)
Monocytes Absolute: 0.8 10*3/uL (ref 0.1–1.0)
Neutrophils Relative %: 67.4 % (ref 43.0–77.0)
RBC: 3.92 M/uL (ref 3.87–5.11)
RDW: 11.6 % (ref 11.5–14.6)
Sodium: 139 meq/L (ref 135–145)
Total Protein: 7.6 g/dL (ref 6.0–8.3)

## 2009-04-22 ENCOUNTER — Encounter: Payer: Self-pay | Admitting: Internal Medicine

## 2009-05-23 ENCOUNTER — Encounter: Payer: Self-pay | Admitting: Internal Medicine

## 2009-06-23 ENCOUNTER — Encounter: Payer: Self-pay | Admitting: Internal Medicine

## 2009-07-24 ENCOUNTER — Encounter: Payer: Self-pay | Admitting: Internal Medicine

## 2009-08-24 ENCOUNTER — Encounter: Payer: Self-pay | Admitting: Internal Medicine

## 2009-09-01 ENCOUNTER — Telehealth: Payer: Self-pay | Admitting: Internal Medicine

## 2009-09-07 ENCOUNTER — Encounter: Payer: Self-pay | Admitting: Internal Medicine

## 2009-09-24 ENCOUNTER — Encounter: Payer: Self-pay | Admitting: Internal Medicine

## 2009-10-08 ENCOUNTER — Ambulatory Visit: Payer: Self-pay | Admitting: Internal Medicine

## 2009-10-10 LAB — CONVERTED CEMR LAB
ALT: 10 units/L (ref 0–35)
AST: 18 units/L (ref 0–37)
Alkaline Phosphatase: 68 units/L (ref 39–117)
Basophils Absolute: 0 10*3/uL (ref 0.0–0.1)
Basophils Relative: 0.3 % (ref 0.0–3.0)
Bilirubin, Direct: 0.2 mg/dL (ref 0.0–0.3)
CO2: 28 meq/L (ref 19–32)
Chloride: 101 meq/L (ref 96–112)
Cholesterol: 154 mg/dL (ref 0–200)
Eosinophils Absolute: 0.3 10*3/uL (ref 0.0–0.7)
Folate: >20 ng/mL
LDL Cholesterol: 98 mg/dL (ref 0–99)
Lymphocytes Relative: 17.3 % (ref 12.0–46.0)
MCHC: 33.7 g/dL (ref 30.0–36.0)
MCV: 101.9 fL — ABNORMAL HIGH (ref 78.0–100.0)
Monocytes Absolute: 0.7 10*3/uL (ref 0.1–1.0)
Neutrophils Relative %: 66.4 % (ref 43.0–77.0)
Platelets: 197 10*3/uL (ref 150.0–400.0)
Potassium: 4.6 meq/L (ref 3.5–5.1)
Pro B Natriuretic peptide (BNP): 261.3 pg/mL — ABNORMAL HIGH (ref 0.0–100.0)
RDW: 13 % (ref 11.5–14.6)
Total Bilirubin: 1.1 mg/dL (ref 0.3–1.2)
Total Protein: 7.4 g/dL (ref 6.0–8.3)
Vitamin B-12: >1500 pg/mL — ABNORMAL HIGH (ref 211–911)

## 2009-10-13 ENCOUNTER — Encounter: Payer: Self-pay | Admitting: Internal Medicine

## 2009-10-24 ENCOUNTER — Encounter: Payer: Self-pay | Admitting: Internal Medicine

## 2009-11-15 ENCOUNTER — Telehealth: Payer: Self-pay | Admitting: Internal Medicine

## 2009-11-22 ENCOUNTER — Telehealth: Payer: Self-pay | Admitting: Internal Medicine

## 2009-11-22 ENCOUNTER — Encounter: Payer: Self-pay | Admitting: Internal Medicine

## 2009-11-23 ENCOUNTER — Ambulatory Visit: Payer: Self-pay | Admitting: Physician Assistant

## 2009-11-23 ENCOUNTER — Ambulatory Visit: Payer: Self-pay | Admitting: Internal Medicine

## 2009-11-23 DIAGNOSIS — R269 Unspecified abnormalities of gait and mobility: Secondary | ICD-10-CM

## 2009-11-23 DIAGNOSIS — I5032 Chronic diastolic (congestive) heart failure: Secondary | ICD-10-CM

## 2009-11-24 ENCOUNTER — Encounter: Payer: Self-pay | Admitting: Internal Medicine

## 2009-12-03 ENCOUNTER — Ambulatory Visit: Payer: Self-pay | Admitting: Internal Medicine

## 2009-12-03 LAB — CONVERTED CEMR LAB
Chloride: 100 meq/L (ref 96–112)
Creatinine, Ser: 1.3 mg/dL — ABNORMAL HIGH (ref 0.4–1.2)
Sodium: 136 meq/L (ref 135–145)

## 2009-12-13 ENCOUNTER — Ambulatory Visit: Payer: Self-pay | Admitting: Internal Medicine

## 2009-12-18 ENCOUNTER — Encounter: Payer: Self-pay | Admitting: Internal Medicine

## 2009-12-22 LAB — CONVERTED CEMR LAB
Chloride: 101 meq/L (ref 96–112)
GFR calc non Af Amer: 30.31 mL/min (ref >60)
Glucose, Bld: 114 mg/dL — ABNORMAL HIGH (ref 70–99)
Potassium: 4.2 meq/L (ref 3.5–5.1)
Pro B Natriuretic peptide (BNP): 118 pg/mL — ABNORMAL HIGH (ref 0.0–100.0)
Sodium: 138 meq/L (ref 135–145)

## 2009-12-25 ENCOUNTER — Encounter: Payer: Self-pay | Admitting: Internal Medicine

## 2010-01-24 ENCOUNTER — Encounter: Payer: Self-pay | Admitting: Internal Medicine

## 2010-02-12 ENCOUNTER — Encounter: Payer: Self-pay | Admitting: Gastroenterology

## 2010-02-13 ENCOUNTER — Encounter: Payer: Self-pay | Admitting: Cardiothoracic Surgery

## 2010-02-13 ENCOUNTER — Encounter: Payer: Self-pay | Admitting: Gastroenterology

## 2010-02-17 ENCOUNTER — Telehealth: Payer: Self-pay | Admitting: Internal Medicine

## 2010-02-21 ENCOUNTER — Telehealth: Payer: Self-pay | Admitting: Internal Medicine

## 2010-02-23 ENCOUNTER — Other Ambulatory Visit: Payer: Self-pay

## 2010-02-24 ENCOUNTER — Other Ambulatory Visit: Payer: Self-pay | Admitting: Internal Medicine

## 2010-02-24 ENCOUNTER — Other Ambulatory Visit (INDEPENDENT_AMBULATORY_CARE_PROVIDER_SITE_OTHER): Payer: Medicare Other

## 2010-02-24 ENCOUNTER — Encounter: Payer: Self-pay | Admitting: Internal Medicine

## 2010-02-24 DIAGNOSIS — R5383 Other fatigue: Secondary | ICD-10-CM

## 2010-02-24 DIAGNOSIS — R5381 Other malaise: Secondary | ICD-10-CM

## 2010-02-24 LAB — CBC WITH DIFFERENTIAL/PLATELET
Eosinophils Relative: 5.1 % — ABNORMAL HIGH (ref 0.0–5.0)
HCT: 37.3 % (ref 36.0–46.0)
Hemoglobin: 12.9 g/dL (ref 12.0–15.0)
Lymphs Abs: 0.9 10*3/uL (ref 0.7–4.0)
Monocytes Relative: 12.6 % — ABNORMAL HIGH (ref 3.0–12.0)
Neutro Abs: 3.5 10*3/uL (ref 1.4–7.7)
WBC: 5.4 10*3/uL (ref 4.5–10.5)

## 2010-02-24 LAB — BASIC METABOLIC PANEL
Chloride: 97 mEq/L (ref 96–112)
GFR: 31.59 mL/min — ABNORMAL LOW (ref >60.00)
Glucose, Bld: 97 mg/dL (ref 70–99)
Potassium: 4.4 mEq/L (ref 3.5–5.1)
Sodium: 132 mEq/L — ABNORMAL LOW (ref 135–145)

## 2010-02-24 NOTE — Miscellaneous (Signed)
Summary: [facility]   [facility]   Imported By: Roderic Ovens 09/20/2009 12:47:23  _____________________________________________________________________  External Attachment:    Type:   Image     Comment:   External Document

## 2010-02-24 NOTE — Cardiovascular Report (Signed)
Summary: Health Management Program Summary Report   Health Management Program Summary Report   Imported By: Roderic Ovens 02/15/2010 15:19:40  _____________________________________________________________________  External Attachment:    Type:   Image     Comment:   External Document

## 2010-02-24 NOTE — Cardiovascular Report (Signed)
Summary: Health Management Program Status Report   Health Management Program Status Report   Imported By: Roderic Ovens 01/11/2010 13:55:14  _____________________________________________________________________  External Attachment:    Type:   Image     Comment:   External Document

## 2010-02-24 NOTE — Cardiovascular Report (Signed)
Summary: Health Management Program Summary Report   Health Management Program Summary Report   Imported By: Roderic Ovens 02/15/2010 15:16:43  _____________________________________________________________________  External Attachment:    Type:   Image     Comment:   External Document

## 2010-02-24 NOTE — Cardiovascular Report (Signed)
Summary: Health Management Program Summary Report   Health Management Program Summary Report   Imported By: Roderic Ovens 12/31/2009 13:05:43  _____________________________________________________________________  External Attachment:    Type:   Image     Comment:   External Document

## 2010-02-24 NOTE — Progress Notes (Signed)
Summary: set up lab work   Phone Note Call from Patient Call back at [employer] [phone]   Caller: Patient Reason for Call: Talk to Nurse, Lab or Test Results Details for Reason: pt would like to be set up for lab work.  Initial call taken by: Lorne Skeens,  September 01, 2009 12:08 PM  Follow-up for Phone Call        spoke w/pt sch 29m f/u for 9/16 at 9:15 she will come fasting for labs that same day Meredith Staggers, RN  September 01, 2009 4:46 PM

## 2010-02-24 NOTE — Cardiovascular Report (Signed)
Summary: Health Management Program Summary Report  Health Management Program Summary Report   Imported By: Roderic Ovens 04/22/2009 12:37:56  _____________________________________________________________________  External Attachment:    Type:   Image     Comment:   External Document

## 2010-02-24 NOTE — Assessment & Plan Note (Signed)
Summary: ROV/ GD   Primary Provider:  Drue Novel  CC:  f/u -- allergies are acting up.  History of Present Illness: [name] is a very pleasant 77 year old women with a history of severe aortic regurgitation and nonischemic cardiomyopathy with an EF of 25-30%.  She underwent bioprosthetic aortic valve replacement and replacement of her ascending aorta by Dr. Tyrone Sage October 2007, this was complicated by pericardial and pleural effusion. She had a posterior pericardial window placed by a VATS procedure. Subsequently her ejection fraction is normalized to 55%.  She has been plagued by mild chronic dyspnea. She presents today for routine f/u.  Had TIA in October with some double vision. Had echo in October 2010 (which I reviewed) EF 55-60% +diastolic dyfunction (grade 1). Mild MR. Mild PH.  Feels about the same. Allergies are acting up. Continues to have dyspnea (unchanged) on most activities including cleaning the house. Still able to go bowling in a league. Feels much better when she is bowling. Very independent does everything for herself. Continues to have problem with her balance. No falls recently. Wants to go back to cardiac rehab at [facility]. Feels tired all day. No CP or palpitations. No orthopnea or PND. Weight stable. No bloating.   Current Medications (verified): 1)  Toprol Xl 50 Mg  Tb24 (Metoprolol Succinate) .Marland Kitchen.. 1 By Mouth Once Daily 2)  Folic Acid 1 Mg Tabs (Folic Acid) .... Take 1 Tablet By Mouth Once A Day 3)  Ramipril 5 Mg  Caps (Ramipril) .Marland Kitchen.. 1 By Mouth Once Daily 4)  Furosemide 20 Mg  Tabs (Furosemide) .Marland Kitchen.. 1 By Mouth Once Daily 5)  Vitamin B-12 1000 Mcg Subl (Cyanocobalamin) .... On Hold Wants To Talk To Dr. 6)  Simvastatin 40 Mg Tabs (Simvastatin) .... Take 1 Tablet By Mouth At Bedtime 7)  Vitamin D 1000 Unit  Tabs (Cholecalciferol) .... Once Daily 8)  Aloe Vera 470 Mg Caps (Aloe Vera) .... Once Daily 9)  Cetirizine Hcl 10 Mg Tabs (Cetirizine Hcl) .... Once Daily 10)   Aspirin 81 Mg Tbec (Aspirin) .... Take One Tablet By Mouth Daily  Allergies (verified): No Known Drug Allergies  Past History:  Past Medical History: 1. Nonischemic CM          --minial non-obs CAD cath 2007          --EF previously 25%          --most recent EF 55-65% (Feb 09)  2. Severe aortic insufficiency with ascending thoracic aneurysm          -- s/p pericardial tissue AVR and Hemasheild graft in October 2007.          --c/b pericardial effusion requiring window  3. Hyperlipidemia.  4. Hypertension.  5. GERD  6. History of cholelithiasis/gallbladder dysfunction  7. History of B12 deficiency.  8. Allergic rhinitis  9. TIA in October 2010  Review of Systems       As per HPI and past medical history; otherwise all systems negative.   Vital Signs:  Patient profile:   77 year old female Height:      65 inches Weight:      137 pounds Pulse rate:   83 / minute BP sitting:   110 / 70  (left arm) Cuff size:   regular  Vitals Entered By: Hardin Negus, RMA (March 02, 2009 10:45 AM)  Physical Exam  General:  Gen: well appearing. looks younger than stated age. ambulates briskly. no resp difficulty I walked with her in  clinic very stable. good pace. sats 07% HEENT: normal Neck: supple. no JVD. Carotids 2+ bilat; no bruits. No lymphadenopathy or thryomegaly appreciated. Cor: PMI nondisplaced. Regular rate & rhythm. No rubs,murmur. +s4 Lungs: clear Abdomen: soft, nontender, nondistended. No hepatosplenomegaly. No bruits or masses. Good bowel sounds. Extremities: no cyanosis, clubbing, rash, edema Neuro: alert & orientedx3, cranial nerves grossly intact. moves all 4 extremities w/o difficulty. affect pleasant    Impression & Recommendations:  Problem # 1:  DYSPNEA (ICD-786.05) Likely multifactorial. But I suspect her dyspnea is more a factor of her high expecations than actual pathology. I reassured her on this will refer back to cardiac rehab.  Problem # 2:   FATIGUE (ICD-780.79) Chronic. Recheck CBC and thryoid panel  Problem # 3:  AORTIC REGURGITATION (ICD-424.1) Stable s/p AoV repair.  Problem # 4:  HYPERTENSION (ICD-401.9) Blood pressure well controlled. Continue current regimen.\  Other Orders: EKG w/ Interpretation (93000) TLB-CBC Platelet - w/Differential (85025-CBCD) TLB-TSH (Thyroid Stimulating Hormone) (84443-TSH) TLB-T4 (Thyrox), Free [phone]) TLB-BMP (Basic Metabolic Panel-BMET) (80048-METABOL) TLB-Hepatic/Liver Function Pnl (80076-HEPATIC)  Patient Instructions: 1)  We will send your information to cardiac rehab, they will contact you about attending maintance program. 2)  Follow up in 6 months

## 2010-02-24 NOTE — Cardiovascular Report (Signed)
Summary: Health Management Program Summary Report   Health Management Program Summary Report   Imported By: Roderic Ovens 09/21/2009 15:20:33  _____________________________________________________________________  External Attachment:    Type:   Image     Comment:   External Document

## 2010-02-24 NOTE — Progress Notes (Signed)
Summary: blood test result  Phone Note Call from Patient Call back at Home Phone [phone]   Caller: Patient Reason for Call: Talk to Nurse, Lab or Test Results Summary of Call: blood test results.  Initial call taken by: Roe Coombs,  November 15, 2009 1:32 PM  Follow-up for Phone Call        Phone Call Completed PT AWARE OF LAB RESULTS FROM MID SEPT  WILL GET REPEAT BMET ON 11/22/09 Follow-up by: Scherrie Bateman, LPN,  November 15, 2009 2:25 PM

## 2010-02-24 NOTE — Cardiovascular Report (Signed)
Summary: Summary Report  Summary Report   Imported By: Kassie Mends 02/24/2009 11:26:23  _____________________________________________________________________  External Attachment:    Type:   Image     Comment:   External Document

## 2010-02-24 NOTE — Cardiovascular Report (Signed)
Summary: Health Management Program Summary Report  Health Management Program Summary Report   Imported By: Roderic Ovens 05/25/2009 12:54:11  _____________________________________________________________________  External Attachment:    Type:   Image     Comment:   External Document

## 2010-02-24 NOTE — Letter (Signed)
Summary: Generic Letter  [facility], Main Office  [address]   [place], [place] [address]   Phone: [phone]  Fax: [phone]        March 23, 2009 MRN: [id number]    [name] [address] [place], [place]  [address]    Dear Ms. Klawitter,  Your labwork from 03/02/09 was ok.  Your blood count, kidney, thyroid, and liver were all ok.  However it did show your potassium level was slightly elevated, therefore Dr Gala Romney would like for you to come back and have it rechecked.  Please give our office a call to schedule a lab appointment.      Sincerely,  Meredith Staggers, RN Arvilla Meres, MD  This letter has been electronically signed by your physician.

## 2010-02-24 NOTE — Assessment & Plan Note (Signed)
Summary: chest pain/jml   Visit Type:  Follow-up Primary Provider:  Drue Novel   History of Present Illness: [name] is a very pleasant 77 year old woman, prior patient of Dr. Gabriel Rung, with a history of severe aortic regurgitation and nonischemic cardiomyopathy with an EF of 25-30%.  She underwent bioprosthetic aortic valve replacement and replacement of her ascending aorta by Dr. Tyrone Sage October 2007, this was complicated by pericardial and pleural effusion. She had a posterior pericardial window placed by a VATS procedure. Subsequently her ejection fraction is normalized to 55%.  She has been plagued by mild chronic dyspnea. She presents today for routine f/u.  Had TIA in October 2010 with some double vision. Had echo in October 2010 with EF 55-60% +diastolic dyfunction (grade 1). Mild MR. Mild PH.  She complained of dyspnea (unchanged) on most activities including cleaning the house at her last visit and she had an elevated BNP.  Her Lasix was increased.  But she never did this.  She notes a recent episode of chest pain.  This was left sided.  At first, she states that she had never had this before.  However, upon further interview, she notes occasional pain around her breast she thinks may be from her bra.  She did have a chest tube placement after her heart surgery on the left.  The pain is described as an ache and throbbing.  She took 2 aspirin and the pain subsided.  It started when she laid down to go to bed.  She has not had any further symptoms.  She denies exertional chest pain.  She still bowls.  She still has shortness of breath with some activities.  However, she notes that her breathing is good when she bowls.  She denies any syncope.  She denies any orthopnea or PND.  She denies any pedal edema.  She did travel to [place] recently.  She denies any hemoptysis.  Her breathing did not get any worse when she returned from her trip.  Current Medications (verified): 1)  Toprol Xl 50 Mg  Tb24  (Metoprolol Succinate) .Marland Kitchen.. 1 By Mouth Once Daily 2)  Ramipril 5 Mg  Caps (Ramipril) .Marland Kitchen.. 1 By Mouth Once Daily 3)  Furosemide 20 Mg  Tabs (Furosemide) .Marland Kitchen.. 1 By Mouth Once Daily 4)  Simvastatin 40 Mg Tabs (Simvastatin) .... Take 1 Tablet By Mouth At Bedtime 5)  Folic Acid 1 Mg Tabs (Folic Acid) .Marland Kitchen.. 1 By Mouth Daily.  Allergies (verified): No Known Drug Allergies  Past History:  Past Medical History: Last updated: 03/02/2009 1. Nonischemic CM          --minial non-obs CAD cath 2007          --EF previously 25%          --most recent EF 55-65% (Feb 09)  2. Severe aortic insufficiency with ascending thoracic aneurysm          -- s/p pericardial tissue AVR and Hemasheild graft in October 2007.          --c/b pericardial effusion requiring window  3. Hyperlipidemia.  4. Hypertension.  5. GERD  6. History of cholelithiasis/gallbladder dysfunction  7. History of B12 deficiency.  8. Allergic rhinitis  9. TIA in October 2010  Review of Systems       She notes problems with balance.  As per  the HPI.  All other systems reviewed and negative.   Vital Signs:  Patient profile:   77 year old female Height:  65 inches Weight:      136 pounds BMI:     22.71 Pulse rate:   74 / minute BP sitting:   118 / 80  (left arm)  Vitals Entered By: Laurance Flatten CMA (November 23, 2009 10:59 AM)  Physical Exam  General:  Well nourished, well developed, in no acute distress HEENT: normal Neck: no JVD or HJR Cardiac:  normal S1, S2; RRR; no murmur Lungs:  decreased breath sounds bilat; faint crackles at bases Abd: soft, nontender, no hepatomegaly Ext: no clubbing, cyanosis or edema Vascular: no carotid  bruits Skin: warm and dry    EKG  Procedure date:  11/23/2009  Findings:      Normal sinus rhythm with rate of:  74 Left axis TW inversions 1, avL and V6 PACs similar to prior tracing in 09/2009   Impression & Recommendations:  Problem # 1:  CHEST PAIN UNSPECIFIED  (ICD-786.50) Atypical. She had very minimal CAD at cath in 2007 at age 54. No exertional symptoms.  Doubt progressive CAD. ? if symptoms related to volume. She never increased her Lasix. Had recent trip but no symptoms of pul embolism and EKG does not indicate.   Her updated medication list for this problem includes:    Toprol Xl 50 Mg Tb24 (Metoprolol succinate) .Marland Kitchen... 1 by mouth once daily    Ramipril 5 Mg Caps (Ramipril) .Marland Kitchen... 1 by mouth once daily  Problem # 2:  CHRONIC DIASTOLIC HEART FAILURE (ICD-428.32) Had BNP in 400 range last visit. Never increased Lasix. Suggest she increase her lasix. Close follow up in 2 weeks.   Her updated medication list for this problem includes:    Toprol Xl 50 Mg Tb24 (Metoprolol succinate) .Marland Kitchen... 1 by mouth once daily    Ramipril 5 Mg Caps (Ramipril) .Marland Kitchen... 1 by mouth once daily    Furosemide 20 Mg Tabs (Furosemide) .Marland Kitchen... Take 2 tablets by mouth once daily  Problem # 3:  GAIT IMBALANCE (ICD-781.2) suggested she seek a free balance screen at OP rehab  Problem # 4:  AORTIC REGURGITATION (ICD-424.1) s/p tissue AVR   Her updated medication list for this problem includes:    Toprol Xl 50 Mg Tb24 (Metoprolol succinate) .Marland Kitchen... 1 by mouth once daily    Ramipril 5 Mg Caps (Ramipril) .Marland Kitchen... 1 by mouth once daily    Furosemide 20 Mg Tabs (Furosemide) .Marland Kitchen... Take 2 tablets by mouth once daily  Patient Instructions: 1)  You can call [phone] for a free balance screen. 2)  Your physician recommends that you schedule a follow-up appointment in: 2 weeks with Dr. Gala Romney on [address] when Dr. Gala Romney is in Clinic. 3)  Your physician recommends that you return for lab work in: BMET  In one week. 11/30/09 in AM. 4)  Your physician has recommended you make the following change in your medication: Take Lasix 20 mg, 2 tablet once a day. Prescriptions: FUROSEMIDE 20 MG  TABS (FUROSEMIDE) take 2 tablets by mouth once daily  #60 x 6   Entered by:   Ollen Gross, RN, BSN   Authorized by:   Tereso Newcomer PA-C   Signed by:   Ollen Gross, RN, BSN on 11/23/2009   Method used:   Electronically to        [facility]* (retail)       [address]       [place], [place]  [id number]       Ph: [phone]  Fax: [phone]   RxID:   [id number]   I have personally reviewed the prescriptions today for accuracy. Tereso Newcomer PA-C  November 23, 2009 4:51 PM

## 2010-02-24 NOTE — Assessment & Plan Note (Signed)
Summary: 2 WKS/D.MILLER   Visit Type:  Follow-up Primary Provider:  Drue Novel  CC:  cramps in legs and feet at night.  History of Present Illness: [name] is a very pleasant 77 year old woman, prior patient of Dr. Gabriel Rung, with a history of severe aortic regurgitation and nonischemic cardiomyopathy with an EF of 25-30%.  She underwent bioprosthetic aortic valve replacement and replacement of her ascending aorta by Dr. Tyrone Sage October 2007, this was complicated by pericardial and pleural effusion. She had a posterior pericardial window placed by a VATS procedure. Subsequently her ejection fraction is normalized to 55%.  She has been plagued by mild chronic dyspnea. She presents today for routine f/u.  Had TIA in October 2010 with some double vision. Had echo in October 2010 with EF 55-60% +diastolic dyfunction (grade 1). Mild MR. Mild PH.  In September we saw her and she was c/o dyspnea (unchanged) on most activities including cleaning the house. Had an elevated BNP at > 400.  We told her to increase lasix from 20 to 40.  But she never did this  Saw [facility] 2 weeks for atypical CP. He increased her lasix. She returns today for f/u. No more CP. Still dyspneic without change. Having leg cramps. No edema, orthopnea or PND.    Current Medications (verified): 1)  Toprol Xl 50 Mg  Tb24 (Metoprolol Succinate) .Marland Kitchen.. 1 By Mouth Once Daily 2)  Ramipril 5 Mg  Caps (Ramipril) .Marland Kitchen.. 1 By Mouth Once Daily 3)  Furosemide 20 Mg  Tabs (Furosemide) .... Take 2 Tablets By Mouth Once Daily 4)  Simvastatin 40 Mg Tabs (Simvastatin) .... Take 1 Tablet By Mouth At Bedtime 5)  Folic Acid 1 Mg Tabs (Folic Acid) .Marland Kitchen.. 1 By Mouth Daily. 6)  Vein Ease (Horse Chesnutt Seed / Ginkgo Biloba) .... Once Daily  Allergies (verified): No Known Drug Allergies  Past History:  Past Medical History: Last updated: 03/02/2009 1. Nonischemic CM          --minial non-obs CAD cath 2007          --EF previously 25%          --most  recent EF 55-65% (Feb 09)  2. Severe aortic insufficiency with ascending thoracic aneurysm          -- s/p pericardial tissue AVR and Hemasheild graft in October 2007.          --c/b pericardial effusion requiring window  3. Hyperlipidemia.  4. Hypertension.  5. GERD  6. History of cholelithiasis/gallbladder dysfunction  7. History of B12 deficiency.  8. Allergic rhinitis  9. TIA in October 2010  Review of Systems       As per HPI and past medical history; otherwise all systems negative.   Vital Signs:  Patient profile:   77 year old female Height:      65 inches Weight:      134 pounds BMI:     22.38 Pulse rate:   80 / minute BP sitting:   128 / 70  (left arm) Cuff size:   regular  Vitals Entered By: Hardin Negus, RMA (December 13, 2009 3:31 PM)  Physical Exam  General:  Well nourished, well developed, in no acute distress HEENT: normal Neck: no JVD or HJR Cardiac:  normal S1, S2; RRR; no murmur Lungs:  decreased breath sounds bilat; faint crackles at bases Abd: soft, nontender, no hepatomegaly Ext: no clubbing, cyanosis or edema Vascular: no carotid  bruits Skin: warm and dry    Impression &  Recommendations:  Problem # 1:  CHEST PAIN UNSPECIFIED (ICD-786.50) Resolved. Doubt ischemic. Continue current regimen.   Problem # 2:  DYSPNEA (ICD-786.05) Stable. Given cramping will recheck BMET, mag and BNP.   Problem # 3:  AORTIC REGURGITATION (ICD-424.1) Sta ble s/p tissue AVR.   Other Orders: TLB-BMP (Basic Metabolic Panel-BMET) (80048-METABOL) TLB-BNP (B-Natriuretic Peptide) (83880-BNPR) TLB-Magnesium (Mg) (83735-MG)  Patient Instructions: 1)  Labs today 2)  Your physician wants you to follow-up in:  6 months.  You will receive a reminder letter in the mail two months in advance. If you don't receive a letter, please call our office to schedule the follow-up appointment.

## 2010-02-24 NOTE — Cardiovascular Report (Signed)
Summary: Health Management Program Summary Report   Health Management Program Summary Report   Imported By: Roderic Ovens 12/31/2009 13:05:02  _____________________________________________________________________  External Attachment:    Type:   Image     Comment:   External Document

## 2010-02-24 NOTE — Cardiovascular Report (Signed)
Summary: Health Management Program Summary Report   Health Management Program Summary Report   Imported By: Roderic Ovens 09/13/2009 10:04:09  _____________________________________________________________________  External Attachment:    Type:   Image     Comment:   External Document

## 2010-02-24 NOTE — Progress Notes (Signed)
Summary: pain in heart  Phone Note Call from Patient   Caller: Patient [phone] Reason for Call: Talk to Nurse Summary of Call: pt calling re pain in heart last few days- Initial call taken by: Glynda Jaeger,  November 22, 2009 8:27 AM  Follow-up for Phone Call        [name] calls today b/c she has had a pain in her heart "a few times".  She is not having any pain at this time. She said it was like a toothache pain and it was moderate on a scale of 1-10.  Last pm she had this pain and had trouble sleeping.   She took 2 baby aspirin and went to bed.  She says the pain is in the left chest where her bra rubs and is non radiating.  At times the pain was less when she readjusted her bra. She understands to call 911 if the pain is unrelieved.  She has a 10:30 appt 11/1 with PA Mylo Red RN

## 2010-02-24 NOTE — Cardiovascular Report (Signed)
Summary: Health Management Program Summary Report  Health Management Program Summary Report   Imported By: Roderic Ovens 04/22/2009 12:39:03  _____________________________________________________________________  External Attachment:    Type:   Image     Comment:   External Document

## 2010-02-24 NOTE — Cardiovascular Report (Signed)
Summary: Health Management Program Summary Report   Health Management Program Summary Report   Imported By: Roderic Ovens 11/03/2009 11:46:05  _____________________________________________________________________  External Attachment:    Type:   Image     Comment:   External Document

## 2010-02-24 NOTE — Miscellaneous (Signed)
Summary: [facility] Progress [facility]    [facility] Progress Report   Imported By: Roderic Ovens 12/06/2009 14:42:23  _____________________________________________________________________  External Attachment:    Type:   Image     Comment:   External Document

## 2010-02-24 NOTE — Letter (Signed)
Summary: [facility]  [facility]   Imported By: Marylou Mccoy 04/05/2009 11:44:42  _____________________________________________________________________  External Attachment:    Type:   Image     Comment:   External Document

## 2010-02-24 NOTE — Progress Notes (Signed)
Summary: Pt getting sick from medication Ramipril  Phone Note Call from Patient Call back at Home Phone [phone]   Caller: Patient Summary of Call: Pt taking Ramipril and it's making her sick stomach with vomiting for the last two nights Initial call taken by: Judie Grieve,  February 17, 2010 11:07 AM  Follow-up for Phone Call        02/17/10--12noon--pt calling stating has taken ramipril (new med) 3 days and has been nauseated and vomiting the past 2 days--also c/o increase in salivation last 2 days--would like to know what to take in it's place--advised i would pass information on to dr Porschia Willbanks and either he or heather, his nurse, will call you Follow-up by: Ledon Snare, RN,  February 17, 2010 12:06 PM

## 2010-02-24 NOTE — Cardiovascular Report (Signed)
Summary: Health Management Program Summary Report   Health Management Program Summary Report   Imported By: Roderic Ovens 07/16/2009 11:15:17  _____________________________________________________________________  External Attachment:    Type:   Image     Comment:   External Document

## 2010-02-24 NOTE — Miscellaneous (Signed)
Summary: Physician Order/Treatment Plan   Physician Order/Treatment Plan   Imported By: Roderic Ovens 10/21/2009 14:57:55  _____________________________________________________________________  External Attachment:    Type:   Image     Comment:   External Document

## 2010-02-24 NOTE — Assessment & Plan Note (Signed)
Summary:  f29m   Visit Type:  Follow-up Primary Provider:  Drue Novel   History of Present Illness: [name] is a very pleasant 77 year old women with a history of severe aortic regurgitation and nonischemic cardiomyopathy with an EF of 25-30%.  She underwent bioprosthetic aortic valve replacement and replacement of her ascending aorta by Dr. Tyrone Sage October 2007, this was complicated by pericardial and pleural effusion. She had a posterior pericardial window placed by a VATS procedure. Subsequently her ejection fraction is normalized to 55%.  She has been plagued by mild chronic dyspnea. She presents today for routine f/u.  Had TIA in October with some double vision. Had echo in October 2010 (which I reviewed) EF 55-60% +diastolic dyfunction (grade 1). Mild MR. Mild PH.  Feels about the same.  Continues to have dyspnea (unchanged) on most activities including cleaning the house. She has had an extensive work-up without any clear etiology.  Still able to go bowling in a league and recently bowled a 171.  Feels much better when she is bowling. Very independent does everything for herself. Continues to have problem with her balance. No falls recently. Feels tired all day. No CP or palpitations. No orthopnea or PND. Weight stable. Wants to go back to cardiac rehab at [facility]. Taking care of a 53-month old puppy at home.  Current Medications (verified): 1)  Toprol Xl 50 Mg  Tb24 (Metoprolol Succinate) .Marland Kitchen.. 1 By Mouth Once Daily 2)  Ramipril 5 Mg  Caps (Ramipril) .Marland Kitchen.. 1 By Mouth Once Daily 3)  Furosemide 20 Mg  Tabs (Furosemide) .Marland Kitchen.. 1 By Mouth Once Daily 4)  Simvastatin 40 Mg Tabs (Simvastatin) .... Take 1 Tablet By Mouth At Bedtime 5)  Aloe Vera 470 Mg Caps (Aloe Vera) .... Once Daily  Allergies (verified): No Known Drug Allergies  Past History:  Past Medical History: Last updated: 03/02/2009 1. Nonischemic CM          --minial non-obs CAD cath 2007          --EF previously 25%          --most  recent EF 55-65% (Feb 09)  2. Severe aortic insufficiency with ascending thoracic aneurysm          -- s/p pericardial tissue AVR and Hemasheild graft in October 2007.          --c/b pericardial effusion requiring window  3. Hyperlipidemia.  4. Hypertension.  5. GERD  6. History of cholelithiasis/gallbladder dysfunction  7. History of B12 deficiency.  8. Allergic rhinitis  9. TIA in October 2010  Vital Signs:  Patient profile:   77 year old female Height:      65 inches Weight:      131 pounds BMI:     21.88 O2 Sat:      95 % Pulse rate:   76 / minute BP sitting:   142 / 80  (left arm)  Vitals Entered By: Laurance Flatten CMA (October 08, 2009 9:41 AM)  Physical Exam  General:  Gen: well appearing. looks younger than stated age. ambulates briskly. no resp difficulty HEENT: normal Neck: supple. no JVD. Carotids 2+ bilat; no bruits. No lymphadenopathy or thryomegaly appreciated. Cor: PMI nondisplaced. Regular rate & rhythm. No rubs,murmur. +s4 Lungs: clear Abdomen: soft, nontender, nondistended. No hepatosplenomegaly. No bruits or masses. Good bowel sounds. Extremities: no cyanosis, clubbing, rash, edema. + varicose veins Neuro: alert & orientedx3, cranial nerves grossly intact. moves all 4 extremities w/o difficulty. affect pleasant    Impression & Recommendations:  Problem # 1:  DYSPNEA (ICD-786.05) Chronic without signicant change or clearly identifiable cause.At last visit I walked her around the office and she walked very briskly without any desaturation. Likely has a component of diastolic dysfunction. Also I fell her expectations are probably quite high for her age. Refer to cardiac rehab maintenance program. Will check labs today and f/u with her in 6 months.   Orders: Cardiac Rehabilitation (Cardiac Rehab) TLB-BMP (Basic Metabolic Panel-BMET) (80048-METABOL) TLB-Hepatic/Liver Function Pnl (80076-HEPATIC) TLB-CBC Platelet - w/Differential (85025-CBCD) TLB-BNP  (B-Natriuretic Peptide) (83880-BNPR) TLB-Lipid Panel (80061-LIPID) TLB-TSH (Thyroid Stimulating Hormone) (84443-TSH) TLB-B12 + Folate Pnl ([id number]-U98/JXB)  Other Orders: EKG w/ Interpretation (93000)  Patient Instructions: 1)  Your physician recommends that you have lab work today: bmet/liver/cbc/bnp/lpid/tsh/B12 (786.05;424.1) 2)  Your physician wants you to follow-up in: 6 months.  You will receive a reminder letter in the mail two months in advance. If you don't receive a letter, please call our office to schedule the follow-up appointment. 3)  You will be referred to Cardiac Rehab Maintenance Program. 4)  Your physician recommends that you continue on your current medications as directed. Please refer to the Current Medication list given to you today.

## 2010-02-24 NOTE — Cardiovascular Report (Signed)
Summary: Health Management Program Summary Report  Health Management Program Summary Report   Imported By: Roderic Ovens 08/10/2009 13:16:09  _____________________________________________________________________  External Attachment:    Type:   Image     Comment:   External Document

## 2010-03-02 NOTE — Progress Notes (Signed)
Summary: MEDICATION QUESTION  Phone Note Call from Patient Call back at Home Phone [phone]   Reason for Call: Talk to Nurse, Talk to Doctor Summary of Call: PT WAS ASKED TO STOP TAKING RAMAPRIL AND SHE WAS SUPPOSE TO GET A CALL BACK ABOUT ANOTHER MEDICATION AND SHE STILL HAS NOT HEARD ANYTHING Initial call taken by: Omer Jack,  February 21, 2010 12:08 PM  Follow-up for Phone Call        She has not taken her Ramipril since last Thursday due to n/v. She is feeling okay now but is wondering which other medication she needs to take. Will forward to Dr. Gala Romney and his RN.  She has also been somewhat SOB ( but this has been going on for awhile)  & lethargic with no edema. She is taking her Lasix as prescribed.  Whitney Maeola Sarah RN  February 21, 2010 12:21 PM  Follow-up by: Whitney Maeola Sarah RN,  February 21, 2010 12:32 PM  Additional Follow-up for Phone Call Additional follow up Details #1::        she has been on ramipril for a while. suspect n/v not related to her meds. please have her come in for labs bmet and CBC tomorrow. if n/v persists would have her f/u with PCP. Dolores Patty, MD, [facility]  February 21, 2010 6:11 PM  pt aware will come tom. for labs Meredith Staggers, RN  February 22, 2010 5:51 PM

## 2010-04-01 ENCOUNTER — Encounter: Payer: Self-pay | Admitting: Internal Medicine

## 2010-04-01 ENCOUNTER — Other Ambulatory Visit: Payer: Medicare Other

## 2010-04-01 ENCOUNTER — Other Ambulatory Visit: Payer: Self-pay | Admitting: Internal Medicine

## 2010-04-01 ENCOUNTER — Ambulatory Visit (INDEPENDENT_AMBULATORY_CARE_PROVIDER_SITE_OTHER): Payer: Medicare Other | Admitting: Internal Medicine

## 2010-04-01 ENCOUNTER — Encounter (INDEPENDENT_AMBULATORY_CARE_PROVIDER_SITE_OTHER): Payer: Self-pay | Admitting: *Deleted

## 2010-04-01 DIAGNOSIS — R5381 Other malaise: Secondary | ICD-10-CM

## 2010-04-01 DIAGNOSIS — I739 Peripheral vascular disease, unspecified: Secondary | ICD-10-CM

## 2010-04-01 DIAGNOSIS — D510 Vitamin B12 deficiency anemia due to intrinsic factor deficiency: Secondary | ICD-10-CM

## 2010-04-01 DIAGNOSIS — I509 Heart failure, unspecified: Secondary | ICD-10-CM

## 2010-04-01 DIAGNOSIS — J310 Chronic rhinitis: Secondary | ICD-10-CM

## 2010-04-01 DIAGNOSIS — I5032 Chronic diastolic (congestive) heart failure: Secondary | ICD-10-CM

## 2010-04-01 DIAGNOSIS — E785 Hyperlipidemia, unspecified: Secondary | ICD-10-CM

## 2010-04-01 DIAGNOSIS — R5383 Other fatigue: Secondary | ICD-10-CM

## 2010-04-01 DIAGNOSIS — G44209 Tension-type headache, unspecified, not intractable: Secondary | ICD-10-CM

## 2010-04-01 DIAGNOSIS — R209 Unspecified disturbances of skin sensation: Secondary | ICD-10-CM

## 2010-04-01 LAB — LIPID PANEL
HDL: 33.6 mg/dL — ABNORMAL LOW (ref >39.00)
Total CHOL/HDL Ratio: 6
Triglycerides: 176 mg/dL — ABNORMAL HIGH (ref 0.0–149.0)
VLDL: 35.2 mg/dL (ref 0.0–40.0)

## 2010-04-01 LAB — CBC WITH DIFFERENTIAL/PLATELET
Basophils Absolute: 0 10*3/uL (ref 0.0–0.1)
Eosinophils Relative: 6.4 % — ABNORMAL HIGH (ref 0.0–5.0)
HCT: 38.6 % (ref 36.0–46.0)
Hemoglobin: 13.3 g/dL (ref 12.0–15.0)
Lymphs Abs: 1 10*3/uL (ref 0.7–4.0)
Monocytes Relative: 12.3 % — ABNORMAL HIGH (ref 3.0–12.0)
Neutro Abs: 3.7 10*3/uL (ref 1.4–7.7)
RDW: 12.9 % (ref 11.5–14.6)

## 2010-04-01 LAB — BASIC METABOLIC PANEL
CO2: 31 mEq/L (ref 19–32)
GFR: 37.03 mL/min — ABNORMAL LOW (ref >60.00)
Glucose, Bld: 94 mg/dL (ref 70–99)
Potassium: 5 mEq/L (ref 3.5–5.1)
Sodium: 137 mEq/L (ref 135–145)

## 2010-04-01 LAB — HEPATIC FUNCTION PANEL: Total Bilirubin: 0.8 mg/dL (ref 0.3–1.2)

## 2010-04-03 DIAGNOSIS — R209 Unspecified disturbances of skin sensation: Secondary | ICD-10-CM | POA: Insufficient documentation

## 2010-04-03 DIAGNOSIS — I739 Peripheral vascular disease, unspecified: Secondary | ICD-10-CM | POA: Insufficient documentation

## 2010-04-03 DIAGNOSIS — G44209 Tension-type headache, unspecified, not intractable: Secondary | ICD-10-CM | POA: Insufficient documentation

## 2010-04-08 ENCOUNTER — Other Ambulatory Visit (HOSPITAL_COMMUNITY): Payer: Medicare Other

## 2010-04-19 ENCOUNTER — Other Ambulatory Visit (HOSPITAL_COMMUNITY): Payer: Self-pay | Admitting: Internal Medicine

## 2010-04-19 DIAGNOSIS — I5032 Chronic diastolic (congestive) heart failure: Secondary | ICD-10-CM

## 2010-04-20 ENCOUNTER — Ambulatory Visit (HOSPITAL_COMMUNITY): Payer: Medicare Other | Attending: Internal Medicine | Admitting: Radiology

## 2010-04-20 ENCOUNTER — Other Ambulatory Visit: Payer: Self-pay | Admitting: Internal Medicine

## 2010-04-20 DIAGNOSIS — I421 Obstructive hypertrophic cardiomyopathy: Secondary | ICD-10-CM

## 2010-04-20 DIAGNOSIS — I428 Other cardiomyopathies: Secondary | ICD-10-CM | POA: Insufficient documentation

## 2010-04-20 DIAGNOSIS — I359 Nonrheumatic aortic valve disorder, unspecified: Secondary | ICD-10-CM

## 2010-04-20 DIAGNOSIS — Z952 Presence of prosthetic heart valve: Secondary | ICD-10-CM | POA: Insufficient documentation

## 2010-04-20 DIAGNOSIS — Z09 Encounter for follow-up examination after completed treatment for conditions other than malignant neoplasm: Secondary | ICD-10-CM | POA: Insufficient documentation

## 2010-04-20 DIAGNOSIS — E785 Hyperlipidemia, unspecified: Secondary | ICD-10-CM | POA: Insufficient documentation

## 2010-04-20 DIAGNOSIS — Z8673 Personal history of transient ischemic attack (TIA), and cerebral infarction without residual deficits: Secondary | ICD-10-CM | POA: Insufficient documentation

## 2010-04-20 DIAGNOSIS — I5032 Chronic diastolic (congestive) heart failure: Secondary | ICD-10-CM

## 2010-04-20 DIAGNOSIS — I77810 Thoracic aortic ectasia: Secondary | ICD-10-CM | POA: Insufficient documentation

## 2010-04-20 DIAGNOSIS — I10 Essential (primary) hypertension: Secondary | ICD-10-CM | POA: Insufficient documentation

## 2010-04-21 NOTE — Assessment & Plan Note (Signed)
Summary: NEW / [employer] MEDICARE/ [facility] Natale Milch  #   Vital Signs:  Patient profile:   77 year old female Height:      65 inches Weight:      137 pounds BMI:     22.88 O2 Sat:      96 % on Room air Temp:     97.4 degrees F oral Pulse rate:   77 / minute BP sitting:   124 / 86  (left arm) Cuff size:   regular  Vitals Entered By: Bill Salinas CMA (April 01, 2010 10:11 AM)  O2 Flow:  Room air CC: New pt here to est care with primary [name]/ ab  Vision Screening:      Vision Comments: Pt had cateract surg about 2 years ago and is due for a follow up eye exam   Primary Care Provider:  Nolon Rod. Paz [name]  CC:  New pt here to est care with primary [name]/ ab.  History of Present Illness: [name] presents to establish for on-going continuity care. She is transferring from [facility]  CC: she has problem breathing - DOE. Chronc problem but she feels this is worse. Her last 2 D echo was oct '10 with EF 55-60% Last BNP was 118  She c/o purple feet when dependent.  Numbness and tingling in hands. When holding phone, when she awakens. Seems to be very positional  Headaches - frontal and vertex scalp; also across forehead. Several times a week. Aspirin helps. More of a crushing nature than exploding. This has been going on for several years.   Preventive Screening-Counseling & Management  Alcohol-Tobacco     Alcohol drinks/day: 0     Smoking Status: never  Caffeine-Diet-Exercise     Caffeine use/day: no     Diet Comments: regular diet     Does Patient Exercise: no  Hep-HIV-STD-Contraception     Hepatitis Risk: no risk noted     HIV Risk: no risk noted     STD Risk: no risk noted     Dental Visit-last 6 months yes     SBE monthly: no     Sun Exposure-Excessive: no  Safety-Violence-Falls     Seat Belt Use: yes     Helmet Use: n/a     Firearms in the Home: no firearms in the home     Smoke Detectors: yes     Violence in the Home: no risk noted     Sexual Abuse: no     Fall Risk: slight fall  risk      Drug Use:  never.        Blood Transfusions:  no.    Current Medications (verified): 1)  Toprol Xl 50 Mg  Tb24 (Metoprolol Succinate) .[name].. 1 By Mouth Once Daily 2)  Ramipril 5 Mg  Caps (Ramipril) .[name].. 1 By Mouth Once Daily 3)  Furosemide 20 Mg  Tabs (Furosemide) .... Take 2 Tablets By Mouth Once Daily 4)  Simvastatin 40 Mg Tabs (Simvastatin) .... Take 1 Tablet By Mouth At Bedtime 5)  Folic Acid 1 Mg Tabs (Folic Acid) .[name].. 1 By Mouth Daily. 6)  Vein Ease (Horse Chesnutt Seed / Ginkgo Biloba) .... Once Daily  Allergies (verified): No Known Drug Allergies  Past History:  Past Medical History: Last updated: 03/02/2009 1. Nonischemic CM          --minial non-obs CAD cath 2007          --EF previously 25%          --  most recent EF 55-65% (Feb 09)  2. Severe aortic insufficiency with ascending thoracic aneurysm          -- s/p pericardial tissue AVR and Hemasheild graft in October 2007.          --c/b pericardial effusion requiring window  3. Hyperlipidemia.  4. Hypertension.  5. GERD  6. History of cholelithiasis/gallbladder dysfunction  7. History of B12 deficiency.  8. Allergic rhinitis  9. TIA in October 2010  Past Surgical History: Last updated: 03/23/2008 Tonsillectomy aortic valve replacement- 06-21-05 bilateral vein stripping  Family History: Family History of Arthritis Family History of CAD Female 1st degree relative <50 Family History Diabetes 1st degree relative Her father died at age 60 after a stroke, but she  states that he was getting dressed and fell back suddenly in the bed and  died there.  Her mother died at age 8 of old age with no further  details available.   One brother died in approximately Jun 22, 1983 during heart  surgery for bypass; whether he was having a valvular issue or coronary  artery disease is unclear.   She states a son died with a cerebral  aneurysm in his 53's.  Social History: [employer] Married @ 1942 - 55 yrs/widowed ('Jun 17, 2022) 3 sons -  22-Jun-2042, 06-21-44 (died of cerebral aneurysn,'55; 1 dtr  06/21/2045; 10 grands; 11 greats - no children live in [place] She lives alone in [place].   She was a housewife.  No  history of alcohol, tobacco or drug abuse. No h/o abuse End- of- life: No CPR; DNI; no prolonged tube feeding. Provided signed "Out of Facility Order" and completed and signed MOST form. These should accompany her to hospital, etc. She should shre her wishes with her family (04/01/10)   Caffeine use/day:  no Does Patient Exercise:  no 2022/06/22 Exposure-Excessive:  no Seat Belt Use:  yes Fall Risk:  slight fall risk Hepatitis Risk:  no risk noted HIV Risk:  no risk noted STD Risk:  no risk noted Dental Care w/in 6 mos.:  yes Blood Transfusions:  no Drug Use:  never  Review of Systems       The patient complains of headaches.  The patient denies anorexia, fever, weight loss, weight gain, vision loss, chest pain, syncope, dyspnea on exertion, prolonged cough, abdominal pain, severe indigestion/heartburn, muscle weakness, transient blindness, difficulty walking, depression, abnormal bleeding, and enlarged lymph nodes.    Physical Exam  General:  Vivacious elderly white woman in no disress Head:  normocephalic and atraumatic.   Eyes:  vision grossly intact, pupils equal, and pupils round.  C&S clear Ears:  External ear exam shows no significant lesions or deformities.  Otoscopic examination reveals clear canals, tympanic membranes are intact bilaterally without bulging, retraction, inflammation or discharge. Hearing is grossly normal bilaterally. Neck:  supple.   Lungs:  normal respiratory effort and normal breath sounds.   Heart:  normal rate and regular rhythm.   Abdomen:  soft and normal bowel sounds.   Msk:  no joint tenderness, no joint swelling, no joint warmth, and no joint deformities.   Pulses:  2+ radial pulses, 1+ dorsalis pedis pulse Extremities:  no pitting edema in lower extremity Neurologic:  alert & oriented X3, cranial  nerves II-XII intact, and gait normal.  Negative Tinel's and PHalen's signs Skin:  turgor normal, color normal, and no suspicious lesions.   Psych:  Oriented X3, memory intact for recent and remote, normally interactive, good eye contact, and not anxious appearing.  Impression & Recommendations:  Problem # 1:  CHRONIC DIASTOLIC HEART FAILURE (ICD-428.32) Patient with a complaint of DOE. A chronic problem that has been addressed by Dr. Gala Romney at her regular visits to him over the past 18 months. Her last echo was '10 with EF 55-60% having recovered after valve replacement from en EF 25-30%. . Her exam today  is not revealng.  Plan - follow-up 2 D echo to assess for progressive diastolic failure  Her updated medication list for this problem includes:    Toprol Xl 50 Mg Tb24 (Metoprolol succinate) .[name]... 1 by mouth once daily    Ramipril 5 Mg Caps (Ramipril) .[name]... 1 by mouth once daily    Furosemide 20 Mg Tabs (Furosemide) .[name]... Take 2 tablets by mouth once daily  Orders: Cardiology Referral (Cardiology)  Problem # 2:  RHINITIS (ICD-472.0) Persistent chronic problem  Plan  - loratadine 10mg  once a day            mucinex two times a day  Problem # 3:  PARESTHESIA, HANDS (ICD-782.0) No evidence of carpal tunnel syndrome. Suspect cervical pathology but her symptoms are minor and further evaluation is not needed at this time.  Problem # 4:  HEADACHE, TENSION (ICD-307.81) Her description and lack of findings is c/w  muscle tension headache.  Plan  - OK to use aleve two times a day for headache.            for failure to respond will send to Dr. Clarisse Gouge  Her updated medication list for this problem includes:    Toprol Xl 50 Mg Tb24 (Metoprolol succinate) .[name]... 1 by mouth once daily  Problem # 5:  UNSPECIFIED PERIPHERAL VASCULAR DISEASE (ICD-443.9) Feet turn purple when dependent. She has dimnished pulses and suspect there is poor venous return which leads to blood pooling and  discoloration of her feet when they're dependent.  Problem # 6:  ANEMIA, PERNICIOUS (ICD-281.0) History of B12 deficiency with anemia.  Plan - lab today with recommendations to follow.  Her updated medication list for this problem includes:    Folic Acid 1 Mg Tabs (Folic acid) .[name]... 1 by mouth daily.  Orders: TLB-B12 + Folate Pnl ([id number]-B12/FOL)  Addendum - B12 supr-normal; no need for exogenous B12 at this time  Problem # 7:  HYPERLIPIDEMIA (ICD-272.4) Patient reports that she has not been taking simvastatin. Her record is reviewed and there is no documentatin of significant CAD. The benefit of statin therapy in an 77 year old is most likely measured in days, not years, of extended survival.  Plan - lipid panel today.  Her updated medication list for this problem includes:    Simvastatin 40 Mg Tabs (Simvastatin) .[name]... Take 1 tablet by mouth at bedtime  Orders: TLB-Lipid Panel (80061-LIPID) TLB-Hepatic/Liver Function Pnl (80076-HEPATIC)  Addendum - LDL 130 is at normal range for moderate risk patient without evidence of CAD  Will not require her to restart "statin" therapy  Problem # 8:  HYPERTENSION (ICD-401.9)  Her updated medication list for this problem includes:    Toprol Xl 50 Mg Tb24 (Metoprolol succinate) .[name]... 1 by mouth once daily    Ramipril 5 Mg Caps (Ramipril) .[name]... 1 by mouth once daily    Furosemide 20 Mg Tabs (Furosemide) .[name]... Take 2 tablets by mouth once daily  BP today: 124/86 Prior BP: 128/70 (12/13/2009)  Labs Reviewed: K+: 4.4 (02/24/2010) Creat: : 1.6 (02/24/2010)     Excellent control. Will continue present medications.  Complete Medication List: 1)  Toprol Xl 50 Mg Tb24 (Metoprolol succinate) .[name].. 1 by mouth once daily 2)  Ramipril 5 Mg Caps (Ramipril) .[name].. 1 by mouth once daily 3)  Furosemide 20 Mg Tabs (Furosemide) .... Take 2 tablets by mouth once daily 4)  Simvastatin 40 Mg Tabs (Simvastatin) .... Take 1 tablet by mouth at  bedtime 5)  Folic Acid 1 Mg Tabs (Folic acid) .[name].. 1 by mouth daily. 6)  Vein Ease (horse Chesnutt Seed / Ginkgo Biloba)  .... Once daily  Patient: [name] Note: All result statuses are Final unless otherwise noted.  Tests: (1) B12 + Folate Panel (B12/FOL)   Vitamin B12          [H]  >1500 pg/mL                 211-911   Folate                    >24.8 ng/mL                 >5.9  Tests: (2) Lipid Panel (LIPID)   Cholesterol          [H]  205 mg/dL                   6-962     ATP III Classification            Desirable:  < 200 mg/dL                    Borderline High:  200 - 239 mg/dL               High:  > = 240 mg/dL   Triglycerides        [H]  176.0 mg/dL                 9.5-284.1     Normal:  <150 mg/dL     Borderline High:  324 - 199 mg/dL   HDL                  [L]  40.10 mg/dL                 >27.25   VLDL Cholesterol          35.2 mg/dL                  3.6-64.4  CHO/HDL Ratio:  CHD Risk                             6                    Men          Women     1/2 Average Risk     3.4          3.3     Average Risk          5.0          4.4     2X Average Risk          9.6          7.1     3X Average Risk          15.0          11.0  Tests: (3) Hepatic/Liver Function Panel (HEPATIC)   Total Bilirubin           0.8 mg/dL                   1.3-0.8   Direct Bilirubin          0.1 mg/dL                   6.5-7.8   Alkaline Phosphatase      74 U/L                      39-117   AST                       15 U/L                      0-37   ALT                       10 U/L                      0-35   Total Protein             6.9 g/dL                    4.6-9.6   Albumin                   4.3 g/dL                    2.9-5.2  Tests: (4) BMP (METABOL)   Sodium                    137 mEq/L                   135-145   Potassium                 5.0 mEq/L                   3.5-5.1   Chloride                  99 mEq/L                    96-112   Carbon  Dioxide            31 mEq/L                    19-32   Glucose                   94 mg/dL                    84-13   BUN                  [H]  40 mg/dL                    2-44   Creatinine           [H]  1.4 mg/dL                   0.1-0.2   Calcium                   9.6  mg/dL                   1.6-10.9   GFR                  [L]  37.03 mL/min                >60.00  Tests: (5) CBC Platelet w/Diff (CBCD)   White Cell Count          5.9 K/uL                    4.5-10.5   Red Cell Count       [L]  3.81 Mil/uL                 3.87-5.11   Hemoglobin                13.3 g/dL                   60.4-54.0   Hematocrit                38.6 %                      36.0-46.0   MCV                  [H]  101.3 fl                    78.0-100.0   MCHC                      34.5 g/dL                   98.1-19.1   RDW                       12.9 %                      11.5-14.6   Platelet Count            192.0 K/uL                  150.0-400.0   Neutrophil %              63.3 %                      43.0-77.0   Lymphocyte %              17.3 %                      12.0-46.0   Monocyte %           [H]  12.3 %                      3.0-12.0   Eosinophils%         [H]  6.4 %                       0.0-5.0   Basophils %               0.7 %                       0.0-3.0   Neutrophill Absolute  3.7 K/uL                    1.4-7.7   Lymphocyte Absolute       1.0 K/uL                    0.7-4.0   Monocyte Absolute         0.7 K/uL                    0.1-1.0  Eosinophils, Absolute                             0.4 K/uL                    0.0-0.7   Basophils Absolute        0.0 K/uL                    0.0-0.1  Tests: (6) Cholesterol LDL - Direct (DIRLDL)  Cholesterol LDL - Direct                             130.1 mg/dL     Optimal:  <161 mg/dL     Near or Above Optimal:  100-129 mg/dL     Borderline High:  096-045 mg/dL     High:  409-811 mg/dL     Very High:  >914 mg/dL  Patient Instructions: 1)  Shortness of  breath: a chronic problem. Plan - will recheck a 2D echo to see if there is any change from oct '10 2)  Headache - sounds like a muscle tension headache. Plan - when the headache starts take an aleve. May take aleve twice a day if needed. If this doesn't help and the headache persist on a frequent basis will refer to Dr. Clarisse Gouge, a neurologist who specializes in Headache 3)  Mucus drainage - no sign of infection. Continue mucinex two tabs AM and PM which helps the flow of mucus. Take loratadine 46m g once a day - an antihistamine to slow down the post-nasal drainage. 4)  Numbness in hands - no carpal tunnel syndrome. Suspect cervical origin from arthritis or disk disease. 5)  End of life care - keep the documents given in a handy grab bag. Talk with your children about this so that they know your wishes.    Orders Added: 1)  Cardiology Referral [Cardiology] 2)  Est. Patient Level V [99215] 3)  TLB-B12 + Folate Pnl [[id number]-B12/FOL] 4)  TLB-Lipid Panel [80061-LIPID] 5)  TLB-Hepatic/Liver Function Pnl [80076-HEPATIC] 6)  Cardiology Referral [Cardiology]

## 2010-04-26 ENCOUNTER — Telehealth: Payer: Self-pay | Admitting: Internal Medicine

## 2010-04-26 NOTE — Telephone Encounter (Signed)
She has had chronic dyspnea for a very long time despite recovery of her LV function. She has had an extensive work-up for this without any clear etiology and actually seems to remain quite functional despite her subjective complaints. She does not have a low output state.

## 2010-04-26 NOTE — Telephone Encounter (Signed)
Called patient with results of the 2 d echo. She has many questions about why she gets short of breath with walking or bowling. I tried but it is very hard to explain her complicated cardiomyopathy and low output state. She is asked to schedule an appointment when she gets back from [place] for the purpose of check pulse ox with walking to see if she will benefit from O2. I advised her to be careful with flying - to ask for oxygen if she gets short of breath at altitude. She is to see Dr. Gala Romney in May or June.

## 2010-04-28 LAB — POCT CARDIAC MARKERS: Myoglobin, poc: 86.4 ng/mL (ref 12–200)

## 2010-04-28 LAB — DIFFERENTIAL
Basophils Absolute: 0 10*3/uL (ref 0.0–0.1)
Basophils Relative: 0 % (ref 0–1)
Eosinophils Absolute: 0.4 10*3/uL (ref 0.0–0.7)
Eosinophils Relative: 8 % — ABNORMAL HIGH (ref 0–5)
Monocytes Absolute: 0.7 10*3/uL (ref 0.1–1.0)

## 2010-04-28 LAB — POCT I-STAT, CHEM 8
BUN: 30 mg/dL — ABNORMAL HIGH (ref 6–23)
Creatinine, Ser: 1.3 mg/dL — ABNORMAL HIGH (ref 0.4–1.2)
Sodium: 138 mEq/L (ref 135–145)
TCO2: 24 mmol/L (ref 0–100)

## 2010-04-28 LAB — URINE MICROSCOPIC-ADD ON

## 2010-04-28 LAB — URINE CULTURE: Colony Count: >=100000

## 2010-04-28 LAB — URINALYSIS, ROUTINE W REFLEX MICROSCOPIC
Glucose, UA: NEGATIVE mg/dL
Protein, ur: NEGATIVE mg/dL
Urobilinogen, UA: 0.2 mg/dL (ref 0.0–1.0)

## 2010-04-28 LAB — CBC
HCT: 37.3 % (ref 36.0–46.0)
Hemoglobin: 12.9 g/dL (ref 12.0–15.0)
MCHC: 34.7 g/dL (ref 30.0–36.0)
RDW: 13.4 % (ref 11.5–15.5)

## 2010-04-28 LAB — COMPREHENSIVE METABOLIC PANEL
ALT: 14 U/L (ref 0–35)
Albumin: 4.1 g/dL (ref 3.5–5.2)
Alkaline Phosphatase: 72 U/L (ref 39–117)
GFR calc Af Amer: 49 mL/min — ABNORMAL LOW (ref >60)
Potassium: 4.3 mEq/L (ref 3.5–5.1)
Sodium: 140 mEq/L (ref 135–145)
Total Protein: 7.3 g/dL (ref 6.0–8.3)

## 2010-04-28 LAB — CK TOTAL AND CKMB (NOT AT ARMC)
CK, MB: 2.6 ng/mL (ref 0.3–4.0)
Total CK: 71 U/L (ref 7–177)

## 2010-04-28 LAB — LIPID PANEL
HDL: 33 mg/dL — ABNORMAL LOW (ref >39)
LDL Cholesterol: 55 mg/dL (ref 0–99)
Total CHOL/HDL Ratio: 3.2 RATIO
Triglycerides: 93 mg/dL (ref <150)
VLDL: 19 mg/dL (ref 0–40)

## 2010-04-28 LAB — RAPID URINE DRUG SCREEN, HOSP PERFORMED: Barbiturates: NOT DETECTED

## 2010-04-28 LAB — TSH: TSH: 2.686 u[IU]/mL (ref 0.350–4.500)

## 2010-04-28 LAB — TROPONIN I: Troponin I: 0.03 ng/mL (ref 0.00–0.06)

## 2010-05-08 ENCOUNTER — Other Ambulatory Visit: Payer: Self-pay | Admitting: Internal Medicine

## 2010-06-07 NOTE — Assessment & Plan Note (Signed)
[facility]                         GASTROENTEROLOGY OFFICE NOTE   NAME:FITZNERKrysti, Hickling                    MRN:          [id number]  DATE:11/13/2006                            DOB:          29-Oct-1919    Mrs. Goodlow is a very complicated 77 year old white female who has had  mostly cardiac problems over the last several years revolving around  aortic valve regurgitation requiring aortic valve replacement, and  replacement of a dilated ascending aorta by Dr. Ofilia Neas in October  2007.  She developed several episodes of pericardial and pleural  effusions requiring several admissions over the last several months, the  last admission in March of 2008.  She is currently stable from a  cardiovascular standpoint, and is really doing well in that regard.   She is referred today because of recurrent anorexia, nausea and  vomiting, which is of a periodic nature, and an abnormal MRI scan that  was done earlier this week by Dr. Drue Novel.  This showed dilated main  pancreatic duct with ductal beading consistent with chronic  pancreatitis.  This has been noted previously a year ago when she was  under the care of Dr. Corinda Gubler, and he obtained a hepatobiliary scan,  which was markedly abnormal, but I cannot see where surgery was  recommended.  There is no mention in her chart of previous pancreatitis,  hepatitis, and she denies alcohol abuse.   Over the last couple of years, she has had rather progressive weight  loss, but it has been felt that a lot of this was from her  cardiovascular issues.  However, she relates now that she does have  periodic intermittent very sudden nausea, vomiting, which will last only  a few hours in duration.  Dr. Corinda Gubler suggested upper GI/barium swallow  in January of 2008 that was not performed.  Her last endoscopy was in  March of 2007, at which time she had diffuse gastritis with a negative  RUT test.  She was placed on Carafate  and she has been on chronic B12  replacement therapy.  She denies NSAID abuse.  She denies lower GI or  specific hepatobiliary complaints.  Recent liver function tests per Dr.  Drue Novel were unremarkable, except for a slightly elevated bilirubin of 1.4  mg percent.  The patient additionally has been seen by Dr. Corinda Gubler for  many years, and I cannot find a record of any colonoscopy report.  She  does carry a diagnosis of irritable bowel syndrome, constipation  predominant.   PAST MEDICAL HISTORY:  The patient has a history of chronic anxiety and  depression, pernicious anemia, and chronic obstructive lung disease,  hyperlipidemia, and allergic rhinitis.   MEDICATIONS:  1. Lasix 20 mg a day.  2. Sunchalor 1000 mg 2 a day.  3. Toprol XL 50 mg a day.  4. Monthly B12 injection.  5. Altace 5 mg a day.   She denies drug allergies.   FAMILY HISTORY:  Noncontributory.   SOCIAL HISTORY:  The patient is widowed and lives alone.  She does not  use alcohol or cigarettes.   REVIEW OF  SYSTEMS:  Otherwise, noncontributory, except for recent  allergic rhinitis requiring evaluation by Dr. Fannie Knee on October 05, 2006.   EXAMINATION:  She is awake and alert, in no acute distress.  I cannot  definitely appreciate stigmata of chronic liver disease.  She weighs 125 pounds and blood pressure is 110/80, and pulse was 62 and  regular.  I cannot appreciate hepatosplenomegaly, abdominal masses, or tenderness.  Bowel sounds are normal.  CHEST:  Generally clear and I cannot appreciate any definite murmurs,  gallops, or rubs.  She appears to be in a regular rhythm.  There is no peripheral edema, phlebitis, swollen joints.  Her mental status is clear.   ASSESSMENT:  Mrs. Vanderbeck most likely has chronic idiopathic  pancreatitis versus pancreatic carcinoma.  She, in the past, has had  evidence of chronic gallbladder dysfunction with a very abnormal CCK -  HIDA scan, and this may be contributing to some  of her intermittent  nausea and vomiting.  It certainly does not sound like she has ischemic  bowel syndrome.   RECOMMENDATIONS:  1. Check CA 19-9 pancreatic tumor marker.  2. Endoscopic ultrasound exam of the pancreas and biliary tract.  3. Would suggest this patient be on daily PPI therapy, which she has      refused.  4. Consider further evaluation depending on her clinical course and      workup as mentioned above.  5. Continue other medications per her multiple physicians.     Vania Rea. Jarold Motto, MD, Caleen Essex, FAGA  Electronically Signed    DRP/MedQ  DD: 11/14/2006  DT: 11/15/2006  Job #: [id number]   cc:   Willow Ora, MD  Bevelyn Buckles. Bensimhon, MD  Rachael Fee, MD  Clinton D. Maple Hudson, MD, FCCP, FACP

## 2010-06-07 NOTE — Assessment & Plan Note (Signed)
OFFICE VISIT   [name], [name]  DOB:  1919/03/25                                        August 30, 2006  CHART #:  [id number]   [name] returns to the office after being out of town visiting in  [place] for the last month. She appears to be increasing in physical  activity appropriately though she does complain of dyspnea though her  room air saturations are 98% at rest. She denies any pedal edema, denies  orthopnea, denies PND. She notes that she does have fatigue and is  concerned she will not be able to play golf this fall. She saw Dr.  Gala Romney approximately 6 weeks ago and a repeat echocardiogram was  performed that showed mild diastolic dysfunction, functioning aortic  valve, and improved LV function. Prior to her surgery, her ejection  fraction was markedly decreased. She was scheduled for pulmonary  exercise testing to help sort out her dyspnea; however, she did not keep  her appointment. In addition she comes in today with bottles of  simvastatin from more than a year ago that she notes that has not been  taking it and quit taking her aspirin.   PHYSICAL EXAMINATION:  The patient appears younger than her state age of  18 years.  VITAL SIGNS:  Blood pressure is 117/66, pulse is 59 and regular,  respiratory rate 18, O2 saturations 98%. She is ambulating in the office  without difficulty without obvious dyspnea.  LUNGS:  Clear bilaterally.  She has no murmur of aortic insufficiency. She has varicose veins  present in both lower extremities but without significant edema.   FOLLOWUP:  Chest x-ray done a the [place] office shows no pericardial  effusions. She does have an enlarged heart.   As noted, results from echo on July 2nd were reviewed.   I encouraged the patient to take her home medications as prescribed  including aspirin 81 mg a day. I also encouraged her to recontact the  [place] office for her followup appointment.  Pulmonary  exercise testing was to be arranged. Following this test, I  will discuss the case with Dr. Gala Romney who is now following her.   Sheliah Plane, MD  Electronically Signed   EG/MEDQ  D:  08/30/2006  T:  08/30/2006  Job:  [id number]   cc:   Bevelyn Buckles. Bensimhon, MD

## 2010-06-07 NOTE — Assessment & Plan Note (Signed)
[facility]                         GASTROENTEROLOGY OFFICE NOTE   NAME:[name], [name]                    MRN:          [id number]  DATE:11/13/2006                            DOB:          08/11/1919    The patient is an 77 year old white female who has known aortic  regurgitation and chronic left ventricular dysfunction and chronic  congestive heart failure, who was followed primarily by E. Graceann Congress, MD, [facility].  She additionally carried diagnosis of hypertensive  cardiovascular disease, nonischemic cardiomyopathy, irritable bowel  syndrome, hyperlipidemia, pernicious anemia, and COPD.  She is sent to  my office today by Dr. Drue Novel for follow-up of an MRI which he ordered and  was completed yesterday.  I have never seen this patient previously or  been involved in her care.  She has seen by Ulyess Mort, M.D. on  multiple occasions over the last several years because of dyspepsia,  recurrent nausea and vomiting, and has a nonspecific gastritis per  endoscopy in March of 2007.  She continues to have intermittent nausea  and vomiting and had evaluation by Dr. Corinda Gubler a year ago which included  an abnormal abdominal ultrasound showing cystic lesions of the pancreas  which led to a Hida scan which was markedly abnormal, but I cannot see  where the patient ever had surgical consultation or exactly what  transpired in terms of her care.  Last note in his office was that she  was going to undergo an upper GI series in December which was not  completed.  Apparently since that time she has been hospitalized, has  had what sounds like pericardiocentesis and has had aortic valve  replacement therapy.  Last cardiac note in her chart is from July of  2008 when she saw Bevelyn Buckles. Bensimhon, M.D. who was unsure about what  was causing her chronic shortness of breath.  She was referred to  [facility] D. Young, MD, FCCP, FACP who felt that most of her problems  were  related to chronic rhinitis.   Apparently since that time, she has had recurrent nausea and vomiting  and she apparently saw Dr. Drue Novel, although I cannot find that note in her  chart.  From what I can ascertain, she was sent for another ultrasound  of her abdomen on October 26, 2006, which led to an MRI on November 12, 2006, which showed probable chronic pancreatitis with multiple small  pseudocysts and pancreatic ductal beading.  There were no associated  lymphadenopathy or biliary ductal dilatation or liver lesions.  They  recommended follow-up examination in six months' time.   LABORATORY DATA:  Normal CBC and metabolic function test, liver function  test, amylase and lipase.   The patient has intermittent nausea and vomiting of bilious material,  but has not been sick in two weeks.  She denies reflux symptoms,  dysphagia, or any specific hepatobiliary complaints.  She also denies  change in bowel habits, melena, or hematochezia.  She has lost 30 pounds  of weight over the past few years.  She has carried a diagnosis  previously from Dr. Doreatha Martin  Lind of IBS and chronic cholecystitis.  She  denies any history of whatsoever of chronic alcohol abuse or known  hepatitis or pancreatitis.  Her appetite is poor, but she has been able  to maintain her weight over the last several months from reviewing her  chart.  Despite her cardiac surgery and recurrent pericardial effusions  and thoracenteses, she has been felt by her pulmonary and cardiac  doctors to be stable.   MEDICATIONS:  1. Lasix 20 mg a day.  2. Sunchal oral 1000 mg two a day.  3. Toprol XL 50 mg a day.  4. Altace 5 mg a day.  5. B12 injections monthly.   ALLERGIES:  No known drug allergies.   PHYSICAL EXAMINATION:  GENERAL:  She is an elderly appearing white  female in no acute distress, appearing her stated age.  I cannot  appreciate stigmata of chronic liver disease.  CHEST:  Today it is remarkably clear.  HEART:   She appears to be in a regular rhythm without significant  murmurs, rubs, or gallops.  ABDOMEN:  No organomegaly, masses, or tenderness.  Bowel sounds were  present.   ASSESSMENT:  Review of the MRI and ultrasound results, it certainly  sounds like this patient has chronic idiopathic pancreatitis with  dilatation and beading of her main pancreatic duct.  Of course with her  weight loss and age, pancreatic carcinoma is certainly a consideration  that needs to be excluded.  On reviewing her chart, it seems like this  problem has been smoldering for at least the last year and seems to be  advancing in nature.  She needs endoscopic ultrasound, but also repeat  endoscopic examination of the upper GI tract.   RECOMMENDATIONS:  1. Check CA19-9 pancreatic tumor marker.  2. Endoscopic ultrasound of the pancreas and biliary system per Dr.      Christella Hartigan.  3. Empirically place her on Nexium 40 mg a day until her workup can be      completed.  4. Continue other multiple medications per cardiologist.     Norma Rea. Jarold Motto, MD, Caleen Essex, FAGA  Electronically Signed    DRP/MedQ  DD: 11/13/2006  DT: 11/14/2006  Job #: [id number]   cc:   Willow Ora, MD  Rennis Chris. Maple Hudson, MD, FCCP, FACP  Arturo Morton. Riley Kill, MD, [facility]  Rachael Fee, MD

## 2010-06-07 NOTE — Assessment & Plan Note (Signed)
[facility]                             PULMONARY OFFICE NOTE   NAME:FITZNERSamhita, Kretsch                    MRN:          [id number]  DATE:10/05/2006                            DOB:          06-12-1919    PROBLEM:  An 77 year old woman previously followed at my old office for  allergic rhinitis and asthma, coming now to establish here.   HISTORY:  She has a longstanding history of allergic rhinitis with  lesser problems with asthma.  She was skin-test positive in the past  with a definite atopic component and she had RAST testing positive in  2005 especially for dust mite and Alternaria.  Pulmonary functions as  recently as 2002 had been normal.  She comes now complaining that in the  past year she has felt off-balance if she turns her head quickly.  Before that she could golf and bowl regularly and says that everything  changed after her bypass surgery.  She also complains of persistent  watery rhinorrhea and thinks they may be related.  She does not notice  pressure discomfort in the ears.   MEDICATIONS:  1. Lasix 20 mg.  2. Synchlor 1000 mg x2.  3. Toprol XL 50 mg.  4. B12 shots monthly.  5. Altace 5 mg.  6. Advil p.r.n.   ALLERGIES:  No known drug allergies.   REVIEW OF SYSTEMS:  Exertional dyspnea, swelling of hands and feet,  little cough or wheeze, nothing purulent or bloody, no fever or sweat.   PAST MEDICAL HISTORY:  Allergic rhinitis, aortic valve replacement,  nonischemic cardiomyopathy with ejection fraction of 25-30% in 2007.  Valve replacement also included replacement of descending aorta, no  coronary disease.  Postoperative course was difficult with recurrent  effusions and a pericardial window.  Subsequent ejection fraction 55%.   ALLERGIES:  Intolerant of LATEX.  She is not aware of problems with  contrast dye or aspirin.   SOCIAL HISTORY:  Never smoked.  She is widowed, living alone, recently  traveled to [place].   FAMILY HISTORY:  Allergies, heart disease, and cancer.   OBJECTIVE:  VITAL SIGNS:  Weight 127 pounds, blood pressure 122/72,  pulse 60, room air saturation 99%.  GENERAL:  Bright and alert.  HEENT:  Cerumen in both external canals obscures the drums.  No  nystagmus.  I hear no cervical bruits.  Nasal airway is wet but no  obstructed.  CHEST:  Clear.  HEART:  Rhythm is regular with a few extra beats.  Trace systolic murmur  at the aortic space.  No peripheral edema.   IMPRESSION:  1. Rhinitis.  2. Cerumen impaction.  3. Vertigo or unsteadiness, nonspecific.   PLAN:  1. She asked permission to get her monthly B12 shot here because it is      physically much easier to get here than it is to go routinely to      Dr. Drue Novel office.  She does not feel confident with driving on      [address].  We could provide her B12 injections here if that is okay  with Dr. Drue Novel, but she will continue following with him for her      primary care.  2. Patanase sample one spray each nostril b.i.d.  3. Cerumen lavage by nurse practitioner, Rubye Oaks, NP  4. Schedule return in 12 months, earlier p.r.n.     Clinton D. Maple Hudson, MD, Tonny Bollman, FACP  Electronically Signed    CDY/MedQ  DD: 10/07/2006  DT: 10/08/2006  Job #: [id number]   cc:   Willow Ora, MD

## 2010-06-07 NOTE — Assessment & Plan Note (Signed)
[facility]                            CARDIOLOGY OFFICE NOTE   NAME:[name], Norma GUSMAN                    MRN:          [id number]  DATE:03/06/2008                            DOB:          21-Apr-1919    INTERVAL HISTORY:  [name] is a very pleasant 77 year old women with a  history of severe aortic regurgitation and nonischemic cardiomyopathy  with an EF of 25-30%.  She underwent bioprosthetic aortic valve  replacement and replacement of her ascending aorta by Dr. Tyrone Sage  October 2007, this was complicated by pericardial and pleural effusion.  She had a posterior pericardial window placed by a VATS procedure.  Subsequently her ejection fraction is normalized to 55%.  She has been  plagued by mild chronic dyspnea.   She returns today for followup.  Overall, she says she is doing fairly  well.  She does note that whenever she feels sick, her legs just get  totally weak, but other times she does just fine.  She is enough energy  to go bowling and do other things she likes to do.  She continues to  complain of chronic dyspnea, which is just intermittent.  No orthopnea.  No PND.  No lower extremity edema.   CURRENT MEDICATIONS:  1. Ramipril 5 mg a day.  2. Folic acid 1 mg a day.  3. Lasix 20 a day.  4. Toprol-XL 50 a day.  5. Simvastatin 40 a day.  6. Vitamin B12.  7. Aspirin 162 a day.   PHYSICAL EXAMINATION:  GENERAL:  She looks younger than her stated age,  ambulatory around the clinic without any respiratory distress.  Blood  pressure is 122/70, heart rate 77, and weight is 130.  HEENT:  Normal.  NECK:  Supple.  JVP is about 6-7 cm of water.  Carotids are 2+  bilaterally with bilateral radiated bruits.  There is no lymphadenopathy  or thyromegaly.  CARDIAC:  PMI is not palpable.  She has a regular rate and rhythm with  2/6 systolic ejection murmur at the left sternal border.  S2 is crisp.  No diastolic murmur.  LUNGS:  Clear.  ABDOMEN:   Soft, nontender, and nondistended.  No hepatosplenomegaly, no  bruits, and no masses.  Good bowel sounds.  EXTREMITIES:  Warm with no  cyanosis, clubbing, or edema.  There is significant varicosities.  No  rash.  NEUROLOGIC:  Alert and oriented x3.  Cranial nerves II through XII are  intact.  Moves all 4 extremities without difficulty.  Affect is  pleasant.   EKG shows sinus rhythm with nonspecific T-wave inversions in V6 and lead  I.  These are chronic.   ASSESSMENT AND PLAN:  1. Dyspnea.  This is chronic for her.  She actually looks great.  I      would continue current therapy.  I  did suggest she re-enroll in an      exercise program.  2. Aortic valve replacement.  She is doing well.  The valve sounds      good.  She will get an echocardiogram at her next visit.  3. Hyperlipidemia.  She is followed by her primary care physician.      Goal LDL is less than 100.   DISPOSITION:  Overall I think, [name] is doing wonderfully.  We will  see her back in 9 months for routine followup.     Bevelyn Buckles. Bensimhon, MD  Electronically Signed    DRB/MedQ  DD: 03/06/2008  DT: 03/07/2008  Job #: [id number]

## 2010-06-07 NOTE — Assessment & Plan Note (Signed)
[facility]                            CARDIOLOGY OFFICE NOTE   NAME:[name], [name]                    MRN:          [id number]  DATE:06/27/2006                            DOB:          09/12/1919    ADDENDUM:  Her blood work came back today and shows that her creatinine  has gone from 1.1 to 1.3, BUN from 24 to 32, hemoglobin 11.8, hematocrit  34.   I have reviewed the patient with Dr. Gala Romney.  The elevation of neck  veins is somewhat unusual because I do not think she is significantly  volume overloaded.   He will see her back and consider pericardial constriction.   Yesterday, we had increased her Lasix to 40 a day.  We will decrease her  back to alternating 40 and 20 as of July 01, 2006, repeat the BNP in 2  weeks.     Cecil Cranker, MD, [facility]  Electronically Signed    EJL/MedQ  DD: 06/28/2006  DT: 06/28/2006  Job #: [id number]

## 2010-06-07 NOTE — Assessment & Plan Note (Signed)
[facility]                            CARDIOLOGY OFFICE NOTE   NAME:FITZNEREvelean, Bigler                      MRN:          [id number]  DATE:06/27/2006                            DOB:          September 21, 1919    Ms. Saddler is a very pleasant 77 year old white female, 7 months postop  aortic valve replacement and replacement of ascending aorta by Dr. Ofilia Neas because of severe aortic regurgitation with congestive heart  failure and dilated ascending aorta.  Has had slow recovery and  pericardial effusion, bilateral pleural effusions not responding to  diuretics.  She subsequently had pericardial tamponade and had a  pericardial window but developed recurrent pericardial effusions and  pleural effusions, and subsequently had a posterior pericardial window  by a left VATS.   Patient seen most recently on May 22, 2006.  She states she noticed  some improvement after the VATS, however has again become dyspneic with  exertion.  The echocardiogram on April 29 revealed EF of 55% which  improved from 35%.  There was small pericardial effusion posterior to  the heart, peak gradient across the valve was 14-mm, there was mild  tricuspid regurgitation.   BNP at that time was 543, BUN 24, and creatinine 1.1.   Patient has no orthopnea or PND.   She has no history of pulmonary disease, was never a smoker.   MEDICATIONS:  1. Lasix alternating 20 to 40 every other day.  2. Aspirin 81.  3. Folic acid.  4. Kay-Ciel 20.   Blood pressure 101/60, pulse 82, normal sinus rhythm.  GENERAL APPEARANCE:  Normal, JVPs are elevated, in the right position.  LUNGS:  Clear.  CARDIO:  Exam reveals normal bowel sounds with no significant murmur.  ABDOMINAL:  Liver, spleen, and kidney not palpable; there is no HJ  reflux.  EXTREMITIES:  Reveal no edema.   EKG reveals normal sinus rhythm, left axis deviation, nonspecific ST-T  changes.   IMPRESSION:  Diagnoses as above.   Seven months post aortic valve  replacement and replacement of ascending aorta.   The patient remains dyspneic but otherwise stable.   Reviewed the patient with Dr. Gala Romney, we plan to increase the Lasix  to 40 daily.  I will have her see Dr. Gala Romney back in 3-4 weeks.   She is to have a BNP and H&H today.  A chest x-ray reveals persistent  cardiomegaly however effusions have resolved.   I am not certain of the etiology of the dyspnea, possibly could be a  pulmonary component.  Also concerned that her jugular venous pressures  remain elevated despite other evidence of volume overload.   ADDENDUM:  Her blood work came back today and shows that her creatinine  has gone from 1.1 to 1.3, BUN from 24 to 32, hemoglobin 11.8, hematocrit  34.   I have reviewed the patient with Dr. Gala Romney.  The elevation of neck  veins is somewhat unusual because I do not think she is significantly  volume overloaded.   He will see her back and consider pericardial constriction.   Yesterday, we had increased  her Lasix to 40 a day.  We will decrease her  back to alternating 40 and 20 as of July 01, 2006, repeat the BMP in 2  weeks.     Cecil Cranker, MD, [facility]  Electronically Signed    EJL/MedQ  DD: 06/27/2006  DT: 06/27/2006  Job #: [id number]

## 2010-06-07 NOTE — Assessment & Plan Note (Signed)
[facility]                            CARDIOLOGY OFFICE NOTE   NAME:[name], [name]                    MRN:          [id number]  DATE:07/25/2006                            DOB:          1919-05-21    INTERVAL HISTORY:  [name] is a delightful 77 year old woman  previously followed by Dr. Glennon Hamilton who presents today for routine  follow up. She is here with her granddaughter.   She has a history of severe aortic valve regurgitation with an  associated non-ischemic cardiomyopathy with an EF of approximately 25%  or 30% in October 2007. She underwent bioprosthetic aortic valve  replacement with a replacement of her descending aorta. There was no  significant coronary artery disease on her preoperative catheterization.   She had quite a complicated postoperative course with recurrent  pericardial effusions requiring thoracentesis. She also had a large  pericardial effusion which required a pericardial window, but this  recurred, and she subsequently had a posterior pericardial window placed  by a left VATS procedure. Her most recent ejection fraction has  normalized at 55%. She has had persistent mild elevations in her BNP.   She says that she continues to suffer from dyspnea. Her granddaughter  says that over the weekend she saw her just walk a few steps and she was  very short of breath and modeled. She did not have any chest pain. She  denies any orthopnea or PND. She has absolutely no symptoms at rest. She  has not had any palpitations. She did have recent pulmonary function  tests which were normal. There was no DLCO measured. She does have some  mild lower extremity edema, but this is mostly dependent edema and  resolves with her feet up.   CURRENT MEDICATIONS:  1. Toprol 50 mg daily.  2. B12 monthly.  3. Aspirin 81 mg.  4. Folic acid.  5. Altace 5 mg daily.  6. Potassium 20 mEq daily.  7. Lasix 20 mg daily alternating with 40 mg  daily.   PHYSICAL EXAMINATION:  GENERAL:  She is actually quite vigorous  appearing. She looks younger than her stated age. She ambulated around  the clinic for about 50-100 yards and her oxygen saturation stayed at  97%.  VITAL SIGNS:  Blood pressure 130/78, heart rate 68, weight 122.  HEENT:  Normal.  NECK:  Supple. There is no JVD. There is no Kussmaul's sign. Carotids  are 2+ bilaterally without bruits. There is no lymphadenopathy or  thyromegaly.  CARDIAC:  She had a regular rate and rhythm with a soft systolic  ejection murmur over the right sternal border. There is no diastolic  murmur. No rub or gallop.  LUNGS:  Clear with decreased breath sounds throughout. There are no  wheezes or rales.  ABDOMEN:  Soft and nontender. There is no hepatosplenomegaly. No bruits,  no masses.  EXTREMITIES:  Warm with no significant cyanosis, clubbing, or edema.  NEUROLOGIC:  Awake, alert, and oriented x3. Cranial nerves II-XII are  intact. She can use all 4 extremities without difficulty. Affect is  pleasant.   ASSESSMENT AND PLAN:  Persistent dyspnea. I had a long talk with Ms.  [name] and her granddaughter that the fact that I am not really sure  what is causing her symptoms. We went through the various possibilities  including coronary artery disease, diastolic heart failure, constrictive  pericarditis, recurrent effusions, and pulmonary embolus, etc. At this  point, we will proceed with a chest x-ray, repeat 2D echocardiogram, and  a cardiopulmonary exercise test. If we are unable to find the etiology  for symptoms, I also told her that we may consider just sending her down  to Dr. Paul Half group at [facility] for a second opinion. We could also  consider a right heart catheterization.   Disposition will be based upon the findings of our testing.   I spent well over 45 minutes with the patient reviewing the chart and  discussing the diagnostic possibilities.     Bevelyn Buckles. Bensimhon,  MD  Electronically Signed    DRB/MedQ  DD: 07/25/2006  DT: 07/26/2006  Job #: [id number]

## 2010-06-07 NOTE — Assessment & Plan Note (Signed)
[facility]                            CARDIOLOGY OFFICE NOTE   NAME:[name], [name]                    MRN:          [id number]  DATE:02/27/2007                            DOB:          1919/03/31    INTERVAL HISTORY:  Ms. Wojdyla is a delightful 77 year old woman with a  history of severe aortic regurgitation, aortic valvular regurgitation  and nonischemic cardiomyopathy with EF of 25-30% in October 2007.  Last  year, she underwent bio-prosthetic aortic valve replacement and  replacement of her descending aorta.  There is no significant coronary  artery disease on her preoperative catheterization.  She had a quite  complicated postoperative course with a large pericardial effusion  requiring pericardial window which recurred and subsequently had a  posterior pericardial window placed by left VATS.  Ejection fraction has  normalized to 55%.  Postoperatively she also had problems with severe  chronic dyspnea.   She returns today for followup.  She has been having some problems with  recurrent nausea.  She had extensive workup with GI which has really  revealed no significant pathology.  Fortunately this has gotten better.  Her dyspnea is also improved markedly.  She is sent back to doing most  of her previous activities.  She is even able to go bowling. She had one  episode of chest pain, but this is resolved.  Has not had anything for  months.   CURRENT MEDICATIONS:  1. __________  620 a day.  2. Sunchallor.  3. Toprol XL 50.  4. B12 injections.  5. Altace 5 mg a day.  6. Folic acid.  7. Lasix 40 a day.  8  Simvastatin.   PHYSICAL EXAM:  She is well-appearing no acute distress.  Appears  younger than her stated age. She ambulates around the clinic without any  respiratory difficulty.  Blood pressure is 126/78, heart rate is 70.  HEENT is normal.  NECK: Is supple.  There is no JVD.  Carotid 2+ bilaterally without  bruits.  There is no  lymphadenopathy or thyromegaly.  CARDIAC:  PMI is nondisplaced.  She has regular rate and rhythm with 2/6  systolic ejection murmur at the left sternal border.  There is no  diastolic murmur.  LUNGS:  Clear.  ABDOMEN:  Soft, nontender, nondistended. There is no hepatosplenomegaly,  no bruits, no mass.  EXTREMITIES:  Warm with no cyanosis, clubbing or  edema.  No rash.  NEURO:  Alert and oriented x3.  Cranial nerves II-XII are intact.  Moves  all four extremities without difficulty.  Affect is very pleasant.   EKG shows normal sinus rhythm with sinus arrhythmia, nonspecific T-wave  changes laterally.  These are chronic.   ASSESSMENT/PLAN:  1. Aortic valvular regurgitation.  She is status post valve      replacement.  She looks great.  I do not hear any aortic      regurgitation on exam.  She is due for a followup echo.  2. Dyspnea.  This is much improved.  I emphasized the need for her to      be as active  as possible.   DISPOSITION:  I will see her back in clinic in 9 months for routine  follow-up.  We will check BMET as she said she had been having problems  swallowing her potassium pills just to make sure her potassium is  stable.  If it is low, she may need liquid potassium.     Bevelyn Buckles. Bensimhon, MD  Electronically Signed    DRB/MedQ  DD: 02/27/2007  DT: 02/28/2007  Job #: [id number]

## 2010-06-07 NOTE — Assessment & Plan Note (Signed)
[facility]                         GASTROENTEROLOGY OFFICE NOTE   NAME:FITZNERGracilyn, [name]                    MRN:          [id number]  DATE:12/06/2006                            DOB:          05/05/1919    [name] is an elderly white female who recently referred to Dr.  Christella Hartigan for endoscopic ultrasound because of her current history of  idiopathic nausea and vomiting which is episodic in nature.  She had an  MRI scan which suggested cystic lesions of her pancreas.  Endoscopic  ultrasound on November 29, 2006, was unremarkable except for some benign  serous cysts.   [name] really is doing extremely well and will very occasionally may  have an episode where she has nausea and emesis of bilious material  which may last for 24 hours and then leave for several weeks at a time.  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  From what I can ascertain, she has never seen a urologist.   [name] denies abdominal pain, but does have some symptoms of early  satiety, however, at the time of endoscopic ultrasound was normal.  Her  last noted symptoms suggestive of chronic bowel obstruction, is having  normal bowel movements and denies clay-colored stools, dark urine,  icterus, fever or chills.   As part of her workup she had a C19-9 that was entirely normal and I  cannot see where she has ever had abnormal liver function tests.   She is on a variety of medications listed and reviewed in her chart as  per my previous notes.   PHYSICAL EXAMINATION:  GENERAL:  She is an attractive, healthy-  appearing, white female appearing younger than her stated age.  She is  very vivacious, alert, no acute distress.  VITAL SIGNS:  She weighs 124 pounds which is her same weight from  previous visit and blood  pressure 132/80.  Pulse was 64 and regular.  HEENT:  I could not appreciate icterus or any other stigmata of chronic  liver disease.  CHEST:  Entirely clear.  HEART:  She was in a regular rhythm without murmurs, gallops, or rubs.  ABDOMEN:  Showed no organomegaly, masses, tenderness, or distention.  Bowel sounds were normal and I could not appreciate a succussion splash.  EXTREMITIES:  There is no peripheral edema or phlebitis.  MENTAL STATUS:  Entirely clear.  There was no asterixis.  She had a  normal gait.  There was no evidence of cerebellar dysfunction on  testing.  I could appreciate no gross focal neurological deficits.   ASSESSMENT:  I am concerned that [name] has a central nervous  system source for her nausea and vomiting.  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  Depending on the results of this, we  will probably refer her for neurologic evaluation.  Her symptoms  are so  intermittent benign in nature with an otherwise negative  gastrointestinal workup and I am not sure we need to pursue any  further gastrointestinal issues at this time.  She has had a previous  abnormal CCK-HIDA scan and her symptoms to me are not consistent with  biliary tract disease.     Vania Rea. Jarold Motto, MD, [facility], FAGA  Electronically Signed    DRP/MedQ  DD: 12/06/2006  DT: 12/06/2006  Job #: [id number]   cc:   Lelon Perla, DO  Clinton D. Maple Hudson, MD, FCCP, FACP  Cecil Cranker, MD, [facility]

## 2010-06-07 NOTE — Assessment & Plan Note (Signed)
[facility]                            CARDIOLOGY OFFICE NOTE   NAME:[name], [name]                    MRN:          [id number]  DATE:09/16/2007                            DOB:          [birth year]    INTERVAL HISTORY:  Ms. Demelo is a delightful 77 year old woman with a  history of severe aortic regurgitation and nonischemic cardiomyopathy  with an EF of 25%-30%.  She underwent bioprosthetic aortic valve  replacement and replacement of her ascending aorta by Dr. Sheliah Plane in October 2007.  This was complicated by pericardial and  pleural effusions.  She had a posterior pericardial window placed by  left VATS.  Her cardiac catheterization showed no significant coronary  artery disease.  Fortunately, her ejection fraction is normalized at  55%, but she still has had some problems with mild chronic dyspnea.   She returns today for a followup.  Overall, she is doing fairly well.  Once again, she does complain of some dyspnea and fatigue.  She says she  just does not have the energy she used to.  She says her legs feel weak  and tired.  No claudication.  She has not had any orthopnea.  No PND.  No lower extremity edema.  No chest pain.   Current medications are Lasix 20 a day, Toprol-XL 50 a day, B12, Altace  5 a day, and simvastatin.   PHYSICAL EXAMINATION:  GENERAL:  She is well appearing in no acute  distress, appears younger than her stated age.  She is ambulatory in the  clinic without respiratory difficulty.  VITAL SIGNS:  Blood pressure is 112/70, heart rate 76, and weight is 128  which is stable.  HEENT:  Normal.  NECK:  Supple.  No JVD.  Carotids are 2+ bilaterally without bruits.  There is no lymphadenopathy or thyromegaly.  CARDIAC:  PMI is nondisplaced.  She has a regular rate and rhythm with  2/6 systolic ejection murmur at the left sternal border.  No diastolic  murmur.  LUNGS:  Clear.  ABDOMEN:  Soft, nontender, and  nondistended.  No hepatosplenomegaly.  No  bruits.  No masses.  EXTREMITIES:  Warm with no cyanosis, clubbing, or edema.  DP pulses are  2+ bilaterally.  She does have significant varicosities.  No rash.  NEUROLOGIC:  Alert and oriented x3.  Cranial nerves II through XII are  intact.  Moves all 4 extremities without difficulty.  Affect is very  pleasant.   EKG shows a sinus rhythm with lateral ST-T-wave changes, likely due to  repolarization.  No change from previous.   ASSESSMENT AND PLAN:  1. Dyspnea and fatigue.  I suspect this is mostly deconditioning.  I      have helped her make arrangements to attend the [facility] Exercise      Program at [facility].  2. Severe aortic regurgitation status post aortic valve replacement.      This is stable.  Last echocardiogram looked good.  3. Congestive heart failure.  Ejection fraction is now normalized.      Volume status looks good.   DISPOSITION:  We will see her back in clinic in 6 months for routine  followup.     Bevelyn Buckles. Bensimhon, MD  Electronically Signed    DRB/MedQ  DD: 09/16/2007  DT: 09/17/2007  Job #: [id number]

## 2010-06-07 NOTE — Assessment & Plan Note (Signed)
[facility]                            CARDIOLOGY OFFICE NOTE   NAME:[name], KAIRAH LEONI                    MRN:          [id number]  DATE:05/22/2006                            DOB:          [birth year]    The patient is a very pleasant 77 year old white female, six months post  aortic valve replacement and replacement of the ascending aorta by Dr.  Ofilia Neas, because of severe aortic regurgitation with congestive  heart failure and dilated ascending aorta.   The patient had a slow recovery and evidence of pericardial effusion and  bilateral pleural effusions, not responding to diuretics.   She subsequently had  tamponade and had a pericardial window, but  developed recurrent pulmonary pericardial effusions and subsequently had  posterior pericardial window by a left VATS.   The patient is somewhat improved, but continues to have dyspnea with  minimal exercise.   Repeat echo today which reveals EF of 55%,  vs. 35% in March. There is  only a small area of pericardial effusion. There is mild tricuspid  regurgitation. No pulmonary hypertension.   The patient is concerned that she still notes her neck veins are  distended.   MEDICATIONS:  1. Toprol XL 50.  2. B12.  3. Altace 5.  4. Lasix 20.  5. KCL 20.  6. Folic acid.  7. Aspirin 81.   PHYSICAL EXAMINATION:  Blood pressure 120/80, pulse 70.  GENERAL APPEARANCE: Normal. JVP is elevated to the angle of the jaw in  the erect position.  LUNGS:  Clear.  CARDIAC: Reveals no significant murmur.  ABDOMEN: Liver, spleen, kidneys not palpable.  EXTREMITIES: Reveal no edema.   IMPRESSION:  The patient is six months post aortic valve replacement  complicated by pericardial effusions post pericardial window and then  VATS procedure with improvement.   The patient still has elevated JVPs, but overall her volume does not  seem to be increased.   I have suggested increasing the Lasix to alternate 20  mg with 40 mg  daily.   We will plan to see her back in 3 to 4 weeks. She should have a repeat  chest x-ray at that time and also will plan to get a BMP, BNP and H&H  this week. I will have Dr.  Gala Romney review her chart as well.     Cecil Cranker, MD, [facility]  Electronically Signed    EJL/MedQ  DD: 05/22/2006  DT: 05/22/2006  Job #: [id number]

## 2010-06-10 NOTE — Op Note (Signed)
[name], SULTON NO.:  [id number]   MEDICAL RECORD NO.:  [id number]          PATIENT TYPE:  INP   LOCATION:  2041                         FACILITY:  [facility]   PHYSICIAN:  Sheliah Plane, MD    DATE OF BIRTH:  April 15, 1919   DATE OF PROCEDURE:  04/09/2006  DATE OF DISCHARGE:  04/13/2006                               OPERATIVE REPORT   PREOP DIAGNOSIS:  Recurrent pericardial effusion.   POSTOP DIAGNOSIS:  Recurrent pericardial effusion.   SURGICAL PROCEDURE:  Left video-assisted thoracoscopy with posterior  pericardial window via left VATS.   SURGEON:  Sheliah Plane, MD   FIRST ASSISTANT:  Jewel Baize, PA.   BRIEF HISTORY:  Patient is an 77 year old female who approximately 1  month previously had undergone a subxyphoid pericardial wound who  approximately 6 months after replacement of the aorta and aortic valve  replacement. The patient had presented approximately 1 month earlier  with tamponade from nonbloody pericardial effusion. A subxyphoid window  was created and patient initially did well but now returns without  evidence of tamponade but with posterior recollection of pericardial  fluid. To avoid possible repeat episodes of tamponade, I have  recommended to the patient to proceed with left video-assisted  thoracoscopy and pericardial drainage by left VATS. Patient agreed and  signed informed consent.   DESCRIPTION OF PROCEDURE:  With central line and arterial line monitor  in place, the patient underwent general endotracheal anesthesia with a  double-lumen endotracheal tube. She tolerated single lung ventilation  without difficulty on the left side. Three port incisions were made over  the left chest, one in the anterior axillary line, one posterior  axillary line and one mid axillary line. Using the port and the video  scope the lung was easily identified. Some left pleural effusion,  approximately 300-400 mL was removed. Pericardium was  identified. The  phrenic nerve was easily identified. Posterior to the phrenic nerve  through one port site, a skin hook was used to stabilize the pericardium  and Bovie scissors were used to make an opening in the posterior  pericardium. A portion of this was submitted for pathology using an endo-  GIA stapler. The pericardium was opened __________ inferiorly and  superiorly and stapled. The edges were hemostatic. Another approximately  300 mL of pericardial fluid, yellow in nature, was removed. Two chest  tubes, one low angled tube and one straight tube were placed through 2  of 3 port holes. The third port hole was closed with interrupted 0  Vicryl subcuticular stitch. Patient's lung was reinflated. She  tolerated the procedure without problem and was awoken and extubated in  the operating room and transferred to the recovery room for  postoperative care. Blood loss was minimal. TEE was done in conjunction  with the procedure and showed removal and resolution of the pericardial  effusion.      Sheliah Plane, MD  Electronically Signed     EG/MEDQ  D:  04/15/2006  T:  04/15/2006  Job:  [id number]   cc:   Bevelyn Buckles. Bensimhon, MD

## 2010-06-10 NOTE — Discharge Summary (Signed)
[name], KARGE NO.:  [id number]   MEDICAL RECORD NO.:  [id number]          PATIENT TYPE:  INP   LOCATION:  2041                         FACILITY:  [facility]   PHYSICIAN:  Theda Belfast, PA DATE OF BIRTH:  1919/02/04   DATE OF ADMISSION:  04/09/2006  DATE OF DISCHARGE:                               DISCHARGE SUMMARY   PRIMARY DIAGNOSIS:  Recurrent pericardial/pleural effusions.   SECONDARY DIAGNOSES:  1. Status post aortic valve, replacement the ascending aorta in      October 2007 due to aortic regurgitation, congestive heart failure.  2. The nonischemic cardiomyopathy with an EF of approximately 40 to      50%.  3. Hyperlipidemia.  4. Hypertension.  5. Gastroesophageal reflux disease.  6. History of cholelithiasis/gallbladder dysfunction.  7. History of B12 deficiency.  8. Status post bilateral vein stripping.  9. Status post tonsillectomy.   OPERATIONS AND PROCEDURES:  1. Left video-assisted thoracoscopic surgery and drainage of pleural      and pericardial effusion with pericardial window.   HISTORY AND PHYSICAL AND HOSPITAL COURSE:  [name] underwent aortic  valve replacement, replacement the ascending aorta in October 2007  because of aortic regurgitation and congestive heart failure.  She  presented late February in tamponade and a large serious pericardial  effusion which was causing her increased symptoms of heart failure.  At  the time CT scan was performed with fluid surrounding the aorta.  Two CT  scans and MRI were performed that did not demonstrate any leak at that  time a subxiphoid pericardial drainage of the effusion.  The patient was  discharged to home March 16, 2006 following pericardial window.  Since discharge the patient has done reasonably well.  However, follow-  up echocardiogram showed resolution of fluid around the right and  anteriorly but recently some posterior pericardial fluid without echo  graphic evidence  of tamponade.  The patient was seen and evaluated by  Dr. Tyrone Sage.  Dr. Tyrone Sage discussed with the patient undergoing a left  video-assisted thoracoscopic surgery with opening in the pericardium  posteriorly.  He discussed risks and benefits of procedure.  The patient  acknowledged her understanding, agreed to proceed.  For details of the  patient's past medical history and physical exam please see dictated  history and physical.   The patient was taken to the operating room April 09, 2006 where she  underwent left video-assisted thoracoscopic surgery with drainage of  pleural and pericardial effusion, posterior pericardial window.  The  patient tolerated this procedure well and transferred to intensive care  unit in stable condition.  The patient's postoperative course was pretty  much unremarkable.  Postop day #1 chest x-ray was stable and posterior  tube was DC'd.  She had minimal chest tube drainage.  The patient was  noted to be hemodynamically stable with hematocrit 30.8%.  She was out  of bed ambulating well postoperatively.  By postop day 2 minimal  drainage was noted from the remaining chest tube.  Remaining chest tube  was DC'd.  Follow-up chest x-ray showed no pneumothorax noted.  The  patient's pulmonary status remained stable and she was able to be weaned  off oxygen sating greater than 90%.  She was transferred up to 2000  postop day #2.  The patient's vital signs were followed closely.  They  seemed to be stable.  The patient was afebrile.  She remained in normal  sinus rhythm.  Pulmonary status remained stable.  Incisions clean, dry  and intact and healing well.   The patient is tentatively ready for discharge home in the a.m. postop  day #4, April 13, 2006.   FOLLOW-UP APPOINTMENTS:  A follow-up appointment will be arranged with  Dr. Tyrone Sage for 1 week.  Our office will contact the patient with this  information.  She will need to obtain PMI chest x-ray 1 hour prior  to  this appointment.  The patient need to follow up with Dr.  Bensimhon/McDowell in 2-4 weeks.  She will need to contact our office to  make these arrangements.   ACTIVITY:  Patient instructed no driving until released to do so, no  heavy lifting over 10 pounds.  She is told to ambulate 3 to 4 times per  day, progress as tolerated.  Continue her breathing exercises.   Incisional care.  The patient is told to shower washing her incisions  using soap and water.  She is to contact the office if she develops any  drainage or opening from any of her incision sites.   Diet.  The patient is educated on diet to be low-fat, low-salt.   DISCHARGE MEDICATIONS:  1. Aspirin 81 mg daily.  2. Toprol XL 50 mg daily.  3. Altace 5 mg daily.  4. Folic acid 1 mg daily.  5. Potassium chloride 20 mEq daily.  6. Lasix 20 mg daily.  7. Percocet 5/325 1-2 tablets q. 4-6 hours p.r.n. pain.      Theda Belfast, PA     KMD/MEDQ  D:  04/12/2006  T:  04/12/2006  Job:  [id number]

## 2010-06-10 NOTE — Procedures (Signed)
[facility]                            ABDOMINAL ULTRASOUND REPORT   NAME:[name], [name]                    MRN:          [id number]  DATE:09/19/2005                            DOB:          18-Dec-1919    ACCESSION NUMBER:  [id number]   ORDERING PHYSICIAN:  Ulyess Mort, MD   READING PHYSICIAN:  Ulyess Mort, MD   INDICATIONS:  Nausea, vomiting, and weight loss.   PROCEDURE:  Multiplanar abdominal ultrasound imaging was performed in the  upright, supine, right and left lateral decubitus positions.   FINDINGS:  Aorta within normal limits, measuring 2.6 mm.  The inferior vena  cava is patent.   The pancreas did reveal an 8.6 x 11.5 mm cyst in the neck, otherwise  appeared normal.   The gallbladder has mildly thickened walls measuring 4.6 mm and otherwise  appeared normal.  The common bile duct was upper limits of normal at 9.7.   The liver was normal.   The kidneys are normal.   The spleen was normal.   IMPRESSION:  A moderate cyst in the head of the pancreas, questionable  etiology.  A somewhat abnormal gallbladder with thickened walls measuring  4.6 mm and with a common bile duct measuring 9.7 mm.  Otherwise, this was a  normal abdominal ultrasound.  I think that a CT of the pancreas, and HIDA  scan of gallbladder.  Her symptoms certainly could be from the gallbladder  being chronically inflamed.  Rule out chronic cholecystitis.                                   Ulyess Mort, MD   SML/MedQ  DD:  09/19/2005  DT:  09/20/2005  Job #:  [id number]

## 2010-06-10 NOTE — Cardiovascular Report (Signed)
NAME:  [name], [name] NO.:  [id number]   MEDICAL RECORD NO.:  [id number]          PATIENT TYPE:  INP   LOCATION:  3743                         FACILITY:  [facility]   PHYSICIAN:  Bevelyn Buckles. Bensimhon, MDDATE OF BIRTH:  21-May-1919   DATE OF PROCEDURE:  11/07/2005  DATE OF DISCHARGE:                              CARDIAC CATHETERIZATION   PRIMARY CARE PHYSICIAN:  Dr. Willow Ora.   CARDIOLOGIST:  Dr. Glennon Hamilton.   PATIENT INDICATION:  Ms. Whitaker is an 77 year old woman who has a history  of aortic insufficiency.  She was recently admitted with acute heart  failure.  Echocardiogram showed depressed LV function, EF about 30%.  She  underwent that TEE, which showed severe aortic insufficiency and EF about  35%.  She was brought today for cardiac catheterization in the outpatient  lab to evaluate her coronary arteries and the degree of aortic insufficiency  prior to possible aortic valve replacement.   PROCEDURES PERFORMED:  1. Right heart catheterization.  2. Left heart catheterization.  3. Selective coronary angiography.  4. Left ventriculogram.  5. Aortic root shot.   DESCRIPTION OF THE PROCEDURE:  The risks and benefits were explained;  consent was signed and placed on the chart.  A 5-French sheath was placed in  the right femoral artery.  However, given the tortuosity of the iliac  system, we switched out for a 5-French long sheath, which was placed over a  wire without complications.  We used to JF6, JR4 and an angled pigtail.  All  catheter exchanges were made over a wire.  There were no apparent  complications.  A 7-French venous sheath was placed in the right femoral  vein and standard right heart catheterization was performed with a Swan-Ganz  catheter.   FINDINGS:   HEMODYNAMICS:  Right atrial pressure mean of 3, RV 28/1, PA 17/6 with mean  of 11.  Pulmonary capillary wedge pressure mean of 4.  Fick cardiac output  2.2 L per minute.  Cardiac index 1.5 L  per minute per meter squared.  Central aortic pressure is 167/42 with a mean of 89.  LV pressure 182/4 an  EDP of 15.  There was no significant aortic stenosis.   Left main was angiographically normal.   LAD was a long vessel.  It gave off 2 moderate-sized diagonals in the  midsection and a very large third diagonal which wrapped the lateral wall  and apex.  The distal LAD was small with 2 small diagonals.  There was  approximately a 20% to 30% tubular lesion in the proximal to mid section of  the LAD.   Left circumflex was a small system and gave off a small ramus branch and a  moderate-sized OM-1.  There was a 30% lesion in the midsection of the  circumflex.   Right coronary artery was an enormous dominant vessel.  It gave off a very  large branching PDA and extensive branching, posterolateral.  There was a  minor irregularity in the proximal portion of the right coronary.   LEFT VENTRICULOGRAM:  Done the RAO position showed an EF of  approximately  35% with significant LV dyssynchrony. In the LAO position EF appeared to be  slightly better, perhaps 40%.   Aortic root shot showed markedly dilated aortic root and proximal ascending  aorta.  There was 4+ central aortic insufficiency.   ASSESSMENT:  1. Minimal nonobstructive coronary artery disease as described above.  2. Moderate left ventricular dysfunction.  3. Severe aortic insufficiency with dilated aortic root and proximal      aorta.  4. Decreased cardiac output.   PLAN/DISCUSSION:  We will admit her for AVR and possible aortic root  replacement with Dr. Jaynie Collins tomorrow.  Given her age 77 and decreased cardiac  output in the face of severe aortic regurgitation, she would be seem to be  at higher risk for perioperative complications.  She will be managed  closely.      Bevelyn Buckles. Bensimhon, MD  Electronically Signed     DRB/MEDQ  D:  11/07/2005  T:  11/08/2005  Job:  [id number]

## 2010-06-10 NOTE — Assessment & Plan Note (Signed)
[facility]                                 ON-CALL NOTE   NAME:FITZNERGloris, Shiroma                      MRN:          [id number]  DATE:03/11/2006                            DOB:          03/21/1919    PRIMARY CARE PHYSICIAN:  Ulyess Mort, M.D.   [name] is an 77 year old white female, patient of Dr. Corinda Gubler  whose son called tonight regarding [name]'s abdominal pain which  she has had now continuously for the past two days.  The patient pain is  localized to the lower abdomen and somewhat left lower quadrant.  She  had normal bowel movements today. She denies any rectal bleeding or  fever.  She has apparently undergone some GI evaluation by Dr. Corinda Gubler  recently for the same abdominal pain, but no definite diagnosis has been  made.  There has been a suspicion for possible gallbladder problems. She  was treated for an infection which sounds like H pylori.  She has  recently recovered from heart valve surgery.  She is Dr. Gabriel Rung Esperanza's  patient and is doing well from that respect.  She vomited one time today  and she tolerated a cup of chicken noodle soup.  I have talked to the  patient personally and she does not sound in any distress.  I have  suggested that we call her Phenergan 12.5 mg to the pharmacy and Pepcid  40 mg for indigestion.  Her son gave me telephone number for two  different pharmacies but both of them are closed.  I called him back and  he is to find out which pharmacy he prefers me to call the medications  in.  When he lets me know, I will arrange for her to pick up her  medications tonight.  If she gets worse tonight, for instance if she  develops fever or more severe pain, I will advise her to go to the  emergency room.     Hedwig Morton. Juanda Chance, MD  Electronically Signed    DMB/MedQ  DD: 03/11/2006  DT: 03/12/2006  Job #: [id number]

## 2010-06-10 NOTE — H&P (Signed)
[facility] ADMISSION   NAME:[name], [name]                    MRN:          [id number]  DATE:10/12/2005                            DOB:          24-Apr-1919    HISTORY OF PRESENT ILLNESS:  [name] is a very pleasant 77 year old  white female with known aortic regurgitation probably related to bicuspid  aortic valve associated with mild LV dysfunction with an ejection fraction  of 45-55% and a negative Cardiolite with an EF of 48%.  The patient was  initially seen by Dr. Andee Lineman.  I have seen her over the past few months.  She has been seen most recently on 09/13/05.  She was stable except for some  mild orthostatic symptoms.  Blood pressure was 127/46 lying and 103/48  standing with pulse going from 70 to 87.   Over the past several days, she has been feeling quite weak.  She has  noticed some increased dyspnea, and last evening her symptoms were rather  severe with orthopnea and possible nocturnal dyspnea.  She states that she  did not sleep at all.  I should note that recently she has had a URI with  some cough productive of dark mucus.  She was seen by Dr. [place] Callas.  The  patient has had no chest pain or palpitations.  I should note that she was  seen by Dr. Victorino Dike on 8/27 with recurrent vomiting of questionable  etiology.  She had a history of cholelithiasis and chronic COPD.  Additional  history of hyperlipidemia and irritable bowel syndrome.  She also has a  history of pernicious anemia, anxiety and depression.   REVIEW OF SYSTEMS:  HEENT:  She does have a mild headache.  She has had the  cough as noted.  CARDIORESPIRATORY:  Aortic regurgitation, moderate degree.  I should also note that she did have some signs of volume overload in  January 2007 at which time her BNP was up to 700.  It subsequently returned  to 200.  GI:  History as per Dr. Victorino Dike with a history of  cholelithiasis and  vomiting.  GU:  Negative.   FAMILY HISTORY:  Father died in 102 of myocardial infarction.  Father died  at 75 of old age.  Family history of diabetes.   SOCIAL HISTORY:  No history of smoking or alcohol.  The patient has been  married for 64 years.   ALLERGIES:  None to drugs.  She does have SEASONAL ALLERGIES for which she  is seen by Dr. Maple Hudson.   STUDIES:  I should note that the most recent echocardiogram revealed LV end-  diastolic dimension of 58.3 and end-systolic of 39.3.  Mild mitral  regurgitation and moderate aortic regurgitation.   She had CPX per Dr. Carollee Herter and was felt to have only mild aortic  regurgitation.   X-ray reveals significant cardiomegaly with probably some increased  pulmonary markings.   EKG reveals normal sinus rhythm, left atrial abnormality, left ventricular  hypertrophy with ST-T abnormality that seems somewhat more significant since  the most recent EKG of August 02, 2005.   MEDICATIONS:  1.  Simvastatin 20 mg.  2.  Furosemide 20 mg q.a.m.  3.  Hydroxyzine.  4.  Diazepam 5 mg b.i.d.  5.  Toprol-XL 50 mg.  6.  Aspirin 81.  7.  AlleRx-DF.   PHYSICAL EXAMINATION:  VITAL SIGNS:  Blood pressure 130/66, pulse 80.  GENERAL APPEARANCE:  The patient is very weak and fatigued with some  dyspnea.  NECK:  JVP is elevated.  In the upright position, there is mild  hepatojugular reflux.  Carotid pulses are palpable, equal and quite brisk.  LUNGS:  Clear.  CARDIAC:  Exam reveals a 1/6 short systolic ejection murmur in the aortic  area, a 2-3/6 diastolic below the left and right sternal border.  There is a  1/6 pansystolic murmur at the left sternal border radiating toward the apex.  ABDOMEN:  The liver is down 2-3 finger breadths, slightly tender.  There is  a slight RV impulse.  EXTREMITIES:  Trace edema.   IMPRESSION:  1.  Aortic regurgitation, moderate to severe, related to a probable bicuspid      valve, now presenting with symptoms of rather  sudden onset of congestive      heart failure.  2.  Mild dilatation of the ascending aorta.  3.  Mild mitral regurgitation and tricuspid regurgitation with a left      ventricular ejection fraction of 45-55%.  4.  Irritable bowel syndrome.  5.  Hyperlipidemia.   PLAN:  Because of the abrupt onset of symptoms of congestive heart failure  and the fact that she, on exam, appears to be in some heart failure, I have  suggested admission.  She should be diuresed and have a followup 2D  echocardiogram and probably a transesophageal echocardiogram as well.  She  may be a candidate for aortic valve replacement and a mitral valve.                                   Cecil Cranker, MD, [facility]   EJL/MedQ  DD:  10/12/2005  DT:  10/12/2005  Job #:  [id number]

## 2010-06-10 NOTE — Consult Note (Signed)
[name], [name]             ACCOUNT NO.:  [id number]   MEDICAL RECORD NO.:  [id number]          PATIENT TYPE:  INP   LOCATION:  3707                         FACILITY:  [facility]   PHYSICIAN:  Mike Gip, PA-C    DATE OF BIRTH:  [birth year]   DATE OF CONSULTATION:  03/13/2006  DATE OF DISCHARGE:                                 CONSULTATION   REASON FOR CONSULTATION:  Shortness of breath and pericardial effusion.   HISTORY OF PRESENT ILLNESS:  The patient is an 77 year old female who  underwent replacement of aortic valve and ascending aorta with a supra-  coronary II graft in October 2007. She had been doing reasonably well  postoperatively. She did have a known small to moderate posterior  pericardial effusion noted on echocardiogram's in November and December  but was relatively asymptomatic from this. Over the past 3 days prior to  admission, she became unable to walk more than a few feet without  shortness of breath and began having some abdominal pain. Because of  these increasing symptoms, she presented to Dr. Diona Browner in the office  and was admitted. Because of the shortness of breath, a CT scan of the  chest was ordered to rule out pulmonary embolism. There was no pulmonary  embolism appreciated, though the patient did have a large collection of  fluid around the ascending aorta and a large pericardial effusion. The  question of possible peri-aortic leak was considered but could not  absolutely be ruled out. A MRI scan was performed, which did not  demonstrate a definite leak but was sub-optimal. A CTA of the ascending  aorta with contrast injected during the arterial phase, was carried out  this evening and again, confirms a moderate to large pericardial  effusion and fluid around the ascending aorta but without any obvious  false aneurysm. The patient has had no fever, chills, or other  constitutional symptoms that makes this suspicious for infection. She  denies any  significant pedal edema.   PAST MEDICAL HISTORY:  1. Non-ischemic cardiomyopathy with approximately 40% ejection      fraction at the time of cardiac catheterization, prior to her      aortic replacement.  2. Chronic systolic congestive heart failure with superimposed acute      heart failure, possibly secondary to tamponade physiology.  3. History of hyperlipidemia.  4. History of gastroesophageal reflux.  5. History of cholelithiasis.  6. History of abdominal pain for at least a year.  7. History of B12 deficiency.  8. Now incidentally, the patient is found to MRI scan to have a      colloid cyst involving the third ventricle and neurosurgery has      been consulted.   ALLERGIES:  She thinks she has a rash to some pain medicines but does  not remember.   CURRENT MEDICATIONS:  1. Aspirin 81 mg daily.  2. Toprol XL 50 mg daily.  3. Xopenex p.r.n.  4. B12 shots.  5. Phenergan.  6. Pepcid 40 mg daily.   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  Husband died  approximately 1 year ago. She denies history of alcohol,  tobacco, or drug use.   FAMILY HISTORY:  The patient's father died at age 61 after a stroke.  Mother died at age 1 but with details unknown. One brother died in 69  after bypass surgery. There was family history of cerebral aneurysm.   REVIEW OF SYSTEMS:  The patient denies fever, chills, night sweats. She  has had chronic abdominal pain and intermittent vomiting but none for  the past 2 days. She denies hematemesis or melena. She has had increase  in frequency of headaches, which is what lead to the MRI of the head to  be performed. She had shortness of breath with exertion, which as noted  above, has gotten worse with dry cough and some wheezing. Denies  psychiatric history. Other review of systems negative.   PHYSICAL EXAMINATION:  VITAL SIGNS:  Blood pressure is 130/80, pulse  105, respiratory rate 20.  GENERAL:  She is on room  air and breathing relatively comfortably. She  is able to talk and relate her history without difficulty.  NECK:  She has no lymphadenopathy. She does have jugular venous  distention on the right side of her neck, much less pronounced on the  left.  CARDIOVASCULAR:  Soft, early systolic murmur 2/6. No murmur of aortic  insufficiency.  ABDOMEN:  Slightly tender in the right upper quadrant without palpable  masses. The liver does not feel palpably enlarged.   LABORATORY DATA:  Findings are reviewed. Creatinine is 1.0, BUN 28.  Hematocrit is 35.5. Sodium 132, glucose 125. CK MB's and troponin's are  not elevated. Amylase is normal. Liver enzymes are normal. BNP is 457.   IMPRESSION/PLAN:  Patient with significant pericardial effusion, 6  months after aortic valve and ascending aortic replacement with  significant amount of fluid around the aortic graft, but without obvious  leak on contrasted CTA of the ascending aorta or MRI. Though, it is  still suspicious for possible periaortic leak. The patient now is  symptomatic from the pericardial effusions and they need to be drained.  I have reviewed in detail with the patient and her son's, the diagnosis  and the cause of her symptoms and I have recommended drainage. They are  aware that drainage of this fluid could involve subxyphoid window, only  to the total replacement of her ascending aorta, depending on what is  found. We will perform the procedure with pump standby and will perform  a TEE in the OR, which may help guide with extent of both fluid and  possibility of anastomotic leak. The risks of death, infection, stroke,  myocardial infarction, bleeding, and blood transfusion have all been  discussed in detail with the patient and her family. Will plan to  proceed tomorrow, March 14, 2006.      Mike Gip, PA-C    AE/MEDQ  D:  03/13/2006  T:  03/13/2006  Job:  [id number]   cc:   Cecil Cranker, MD, [facility]  Jonelle Sidle, MD

## 2010-06-10 NOTE — H&P (Signed)
NAME:  [name], CROLL NO.:  [id number]   MEDICAL RECORD NO.:  [id number]          PATIENT TYPE:  INP   LOCATION:  3707                         FACILITY:  [facility]   PHYSICIAN:  Jonelle Sidle, MD DATE OF BIRTH:  1919-03-30   DATE OF ADMISSION:  03/12/2006  DATE OF DISCHARGE:                              HISTORY & PHYSICAL   CHIEF COMPLAINTS:  Shortness of breath, intermittent abdominal pain,  head aches.   HISTORY OF PRESENT ILLNESS:  [name] is an 77 year old female with a  history of pericardial aortic valve replacement and Hemashield aortic  graft in October 2007.  She has been having problems with nausea and  abdominal pain and occasional vomiting that has been farily chronic in  nature.  Last night she called the GI physician on call and received  prescriptions for Phenergan and famotidine, which she started.  Today  she reports that over the last 3 days she has been unable to walk more  than 10 feet without stopping and sitting down to rest.  When she saw  Dr. Glennon Hamilton on February 13, 2006, her symptoms were not that severe,  although she has been slow to recover from surgery.  She states that she  has gotten gradually worse, being much worse over the last week.  She  denies any chest pain.  She has not had increasing lower extremity  edema.  She is not currently on Lasix, although she was for a while  after her surgery.  Currently, she is lying quietly in bed and states  that she is not short of breath at rest.  She also reports intermittent  head aches.   PAST MEDICAL HISTORY:  1. Status post cardiac catheterization, November 07, 2005, with no      significant disease in the left main or diagonals, LAD 20% to 30%      in the midportion, circumflex 30% and no significant disease in OM      or RCA territory.  2. Nonischemic cardiomyopathy with an EF of approximately 40-50%.  3. History of 4+ central aortic insufficiency as well as ascending  thoracic aneurysm, status post placement of pericardial tissue      valve and Hemasheild graft in October 2007.  4. Hyperlipidemia.  5. Hypertension.  6. History of gastroesophageal reflux disease symptoms.  7. History of apparent cholelithiasis/gallbladder dysfunction      diagnosed within the last year.  8. History of B12 deficiency.   SURGICAL HISTORY:  She is status post open heart surgery as outlined  above as well as bilateral vein stripping and tonsillectomy in the  remote past.   ALLERGIES:  She reports a rash and thinks it was too SOME PAIN-CONTROL  MEDICATION, but does remember which one.  She also states that she has  had ECG PADS give her a rash in the past.   CURRENT MEDICATIONS:  1. Aspirin 81 mg a day.  2. Toprol-XL 50 mg a day.  3. Xopenex MDI p.r.n.  4. B12 shots every month.  5. Phenergan 12.5 mg q.6 h. p.r.n.  6. Famotidine 40 mg  a day.   SOCIAL HISTORY:  She lives alone in [place].  She is a widow.  She  was a housewife.  Her husband died approximately a year ago.  She has no  history of alcohol, tobacco or drug abuse.   FAMILY HISTORY:  Her father died at age 25 after a stroke, but she  states that he was getting dressed and fell back suddenly in the bed and  died there.  Her mother died at age 36 of old age with no further  details available.  One brother died in approximately 1983-06-08 during heart  surgery for bypass; whether he was having a valvular issue or coronary  artery disease is unclear.  She states a son died with a cerebral  aneurysm in his 65's.   REVIEW OF SYSTEMS:  The abdominal pain is not associated with meals.  She has had nausea for an extended period of time and occasional  vomiting.  Her appetite is decreased because of the nausea and p.o.  intake is significantly decreased.  She has not had any vomiting in the  last 48 hours, but did vomit once approximately 2 days ago.  She has  occasional reflux symptoms.  She denies hematemesis or  melena.  She has  had been having headaches recently, but they are not consistent as far  as date, time or location.  She takes aspirin for them and they do  improve with this.  She has had some shortness of breath at rest, but  generally she has had dyspnea on exertion.  She denies PND, edema or  palpitations.  There is no syncope or presyncope.  She has a little bit  of a dry cough, but no wheezing.  She has not used her inhaler recently.  Review of systems is otherwise negative.   PHYSICAL EXAMINATION:  VITAL SIGNS:  Temperature is 96.8, blood pressure  134/88, pulse 105 and respiratory rate 20. O2 saturation is 96% on room  air.  GENERAL:  She is an elderly white female in no acute distress.  HEENT:  Her head is normocephalic and atraumatic with extraocular  movements intact.  Sclerae clear.  Nares without discharge.  NECK:  There is no lymphadenopathy, thyromegaly or bruit noted.  She has  some JVD but no clear Kussmauls sign.  CV:  Her heart is regular in rate and rhythm with an S1 and S2 and a  soft systolic murmur is noted at the base.  No pericardial rub.  LUNGS:  She has some crackles in the bases with decreased left basilar  breath sounds.  SKIN:  No rashes or lesions are noted.  ABDOMEN:  Soft and nontender and she has slightly decreased bowel  sounds.  EXTREMITIES:  There is no cyanosis, clubbing or edema noted.  MUSCULOSKELETAL:  There is no joint deformity or effusions and no spine  or CVA tenderness is noted.  NEUROLOGIC:  She is alert and oriented.  Cranial nerves II-XII are  grossly intact.  No focal weakness   Chest x-ray shows cardiac enlargement and a relatively small left  pleural effusion.   ECG is pending at the time of dictation.   Laboratory values are pending at the time of dictation.   IMPRESSION:  1. Shortness of breath:  She will be admitted.  Cardiac enzymes will     be cycled, but with her history of nonobstructive coronary artery      disease  by catheterization in October 2007, it is unlikely  that      symptoms are due to an ACS.  A 2-D echocardiogram will be ordered      to re-evaluate previously noted pericardial effusion and assess      left ventricular function.  She has a history of pleural and      pericardial effusions postoperatively, but did not require      mechanical intervention.  She is no longer on a diuretic.  A chest      CT will also be ordered.  2. Abdominal pain:  We will check an amylase and lipase with other      baseline labs.  In in the morning, Gastroenterology will be      contacted to come and evaluate Ms. Rahrig to see if any further      inpatient workup is required.  She has been followed as an      outpatient.  3. Recurrent headaches:  Tomorrow we will check an MRI and MRA, as her      son died of an aneurysm at age 50 and she is very concerned about      this.  Treatment will be as-needed for symptoms.  4. Ms. Brier is otherwise stable and will be continued on her home      medications with a slight change from famotidine to Protonix for      her reflux symptoms.   This Theodore Demark, [facility] dictating for Dr. Simona Huh, who saw the  patient and determined the plan of care.  See also his chart note for  discussion of issues.      Theodore Demark, PA-C      Jonelle Sidle, MD  Electronically Signed    RB/MEDQ  D:  03/12/2006  T:  03/13/2006  Job:  [id number]

## 2010-06-10 NOTE — Letter (Signed)
December 07, 2005     RE:  TUYET, BADER  MRN:  [id number]  /  DOB:  [birth year]   ADDENDUM:  I have reviewed the echo with Dr. Eden Emms.  He does not feel there  are signs of tamponade.  He suggested repeating the 2D echo in two weeks.    Sincerely,      E. Graceann Congress, MD, [facility]  Electronically Signed    EJL/MedQ  DD: 12/07/2005  DT: 12/07/2005  Job #: [id number]

## 2010-06-16 ENCOUNTER — Encounter: Payer: Self-pay | Admitting: *Deleted

## 2010-06-22 ENCOUNTER — Ambulatory Visit (INDEPENDENT_AMBULATORY_CARE_PROVIDER_SITE_OTHER): Payer: Medicare Other | Admitting: Internal Medicine

## 2010-06-22 ENCOUNTER — Encounter: Payer: Self-pay | Admitting: Internal Medicine

## 2010-06-22 VITALS — BP 92/61 | HR 94 | Resp 18 | Ht 64.0 in | Wt 132.1 lb

## 2010-06-22 DIAGNOSIS — R269 Unspecified abnormalities of gait and mobility: Secondary | ICD-10-CM

## 2010-06-22 DIAGNOSIS — R270 Ataxia, unspecified: Secondary | ICD-10-CM

## 2010-06-22 DIAGNOSIS — R2681 Unsteadiness on feet: Secondary | ICD-10-CM

## 2010-06-22 DIAGNOSIS — R42 Dizziness and giddiness: Secondary | ICD-10-CM

## 2010-06-22 DIAGNOSIS — R279 Unspecified lack of coordination: Secondary | ICD-10-CM

## 2010-06-22 DIAGNOSIS — G459 Transient cerebral ischemic attack, unspecified: Secondary | ICD-10-CM

## 2010-06-22 DIAGNOSIS — R0602 Shortness of breath: Secondary | ICD-10-CM

## 2010-06-22 LAB — BASIC METABOLIC PANEL
CO2: 28 mEq/L (ref 19–32)
Chloride: 100 mEq/L (ref 96–112)
Creatinine, Ser: 1.5 mg/dL — ABNORMAL HIGH (ref 0.4–1.2)
Potassium: 4.6 mEq/L (ref 3.5–5.1)

## 2010-06-22 MED ORDER — FUROSEMIDE 20 MG PO TABS: 20.0000 mg | ORAL_TABLET | Freq: Every day | ORAL | Status: DC

## 2010-06-22 MED ORDER — RAMIPRIL 2.5 MG PO CAPS: 2.5000 mg | ORAL_CAPSULE | Freq: Every day | ORAL | Status: DC

## 2010-06-22 NOTE — Assessment & Plan Note (Signed)
She is orthostatic on exam today and has gait instability with walking. We will decrease lasix to once a day and also cut ramipril to 2.5 qd. Check MRI/MRA brain (d/w Dr. Debby Bud who agrees). Will also refer to physical therapy to assess.

## 2010-06-22 NOTE — Assessment & Plan Note (Signed)
Echo and volume status look good. Do not think this is primarily cardiac. Work-up to proceed as outlined in gait instability section.

## 2010-06-22 NOTE — Patient Instructions (Signed)
MRI/MRA of Brain with and without contrast for gait imbalance ? TIA You have been referred to Physical Therapy for gait imbalance Your physician recommends that you schedule a follow-up appointment in: 3 months.

## 2010-06-22 NOTE — Progress Notes (Signed)
Addended by: Noralee Space on: 06/22/2010 02:31 PM   Modules accepted: Orders

## 2010-06-22 NOTE — Progress Notes (Signed)
Addended by: Noralee Space on: 06/22/2010 01:44 PM   Modules accepted: Orders

## 2010-06-22 NOTE — Progress Notes (Signed)
HPI:  [name] is a very pleasant 77 year old woman, prior patient of Dr. Gabriel Rung, with a history of severe aortic regurgitation and nonischemic cardiomyopathy with an EF of 25-30%.  She underwent bioprosthetic aortic valve replacement and replacement of her ascending aorta by Dr. Tyrone Sage October 2007, this was complicated by pericardial and pleural effusion. She had a posterior pericardial window placed by a VATS procedure. Subsequently her ejection fraction is normalized to 55%.  She has been plagued by mild chronic dyspnea.She presents today for routine f/u.  Saw Dr. Debby Bud in March c/o worsening dyspnea. Repeat echo 3/12: EF 55-60%. AVR stable. No PAH. Blood work at that time with normal Hgb.   Returns for routine f/u. Says she continues to feel worse. Says she often dizzy when she gets up. Very fatigued. Feels her SOB is getting worse and she is getting weaker. And gait more unsteady. However, she is still bowling. Takes a couple of naps during the day. Denies snoring. No edema, CP, orthopnea or PND.   ROS: All systems negative except as listed in HPI, PMH and Problem List.  Past Medical History  Diagnosis Date  . Nonischemic cardiomyopathy 2007 / Feb 2009    Minial non-obs CAD cath (2007); EF previously 25%; most recent EF 55-65% (Feb 2009)  . Aortic insufficiency October 2007    Severe with ascending thoracic aneurysm; s/p pericardial tissue AVR and Heamsheild graft in October 2007; c/b pericardial effusion requiring window  . Hyperlipidemia   . Hypertension   . GERD (gastroesophageal reflux disease)   . History of cholelithiasis     Gallbladder dysfunction  . History of non anemic vitamin B12 deficiency   . Allergic rhinitis   . TIA (transient ischemic attack) October 2010    Current Outpatient Prescriptions  Medication Sig Dispense Refill  . folic acid (FOLVITE) 1 MG tablet TAKE 1 TABLET ONCE DAILY.  30 tablet  3  . furosemide (LASIX) 20 MG tablet Take 20 mg by mouth 2 (two) times  daily.        . metoprolol (TOPROL-XL) 50 MG 24 hr tablet Take 50 mg by mouth daily.        . ramipril (ALTACE) 5 MG capsule Take 5 mg by mouth daily.        . NON FORMULARY Vein Ease (Horse Chesnutt Seed/ Ginkgo Biloba), once daily       . DISCONTD: simvastatin (ZOCOR) 40 MG tablet Take 40 mg by mouth at bedtime.           PHYSICAL EXAM: Filed Vitals:   06/22/10 1153  BP: 113/72  Pulse: 85  Resp: 18   General:  Gen: well appearing. looks younger than stated age. I walked her around hall and she had notable gait instability often leaning to L and then overcompensating to R. Sats 96%. HEENT: normal Neck: supple. no JVD. Carotids 2+ bilat; no bruits. No lymphadenopathy or thryomegaly appreciated. Cor: PMI nondisplaced. Regular rate & rhythm. No rubs,murmur. +s4 Lungs: clear Abdomen: soft, nontender, nondistended. No hepatosplenomegaly. No bruits or masses. Good bowel sounds. Extremities: no cyanosis, clubbing, rash, edema. + varicose veins Neuro: alert & orientedx3, cranial nerves grossly intact. moves all 4 extremities w/o difficulty. affect pleasant   ECG: NSR with occ PACs 78. Lateral TWI (no change)   ASSESSMENT & PLAN:

## 2010-06-23 ENCOUNTER — Ambulatory Visit
Admission: RE | Admit: 2010-06-23 | Discharge: 2010-06-23 | Disposition: A | Discharge status: Home / Self Care | Payer: Medicare Other | Source: Ambulatory Visit | Attending: Internal Medicine | Admitting: Internal Medicine

## 2010-06-23 ENCOUNTER — Other Ambulatory Visit: Payer: Self-pay | Admitting: Internal Medicine

## 2010-06-23 ENCOUNTER — Telehealth: Payer: Self-pay | Admitting: *Deleted

## 2010-06-23 DIAGNOSIS — G459 Transient cerebral ischemic attack, unspecified: Secondary | ICD-10-CM

## 2010-06-23 DIAGNOSIS — R270 Ataxia, unspecified: Secondary | ICD-10-CM

## 2010-06-23 DIAGNOSIS — R42 Dizziness and giddiness: Secondary | ICD-10-CM

## 2010-06-23 DIAGNOSIS — R2681 Unsteadiness on feet: Secondary | ICD-10-CM

## 2010-06-23 DIAGNOSIS — R634 Abnormal weight loss: Secondary | ICD-10-CM

## 2010-06-23 DIAGNOSIS — G9389 Other specified disorders of brain: Secondary | ICD-10-CM

## 2010-06-23 DIAGNOSIS — C799 Secondary malignant neoplasm of unspecified site: Secondary | ICD-10-CM

## 2010-06-23 DIAGNOSIS — R5383 Other fatigue: Secondary | ICD-10-CM

## 2010-06-24 ENCOUNTER — Ambulatory Visit (INDEPENDENT_AMBULATORY_CARE_PROVIDER_SITE_OTHER)
Admission: RE | Admit: 2010-06-24 | Discharge: 2010-06-24 | Disposition: A | Discharge status: Home / Self Care | Payer: Medicare Other | Source: Ambulatory Visit | Attending: Internal Medicine | Admitting: Internal Medicine

## 2010-06-24 DIAGNOSIS — R634 Abnormal weight loss: Secondary | ICD-10-CM

## 2010-06-24 DIAGNOSIS — G9389 Other specified disorders of brain: Secondary | ICD-10-CM

## 2010-06-24 DIAGNOSIS — G939 Disorder of brain, unspecified: Secondary | ICD-10-CM

## 2010-06-24 DIAGNOSIS — C799 Secondary malignant neoplasm of unspecified site: Secondary | ICD-10-CM

## 2010-06-24 DIAGNOSIS — C801 Malignant (primary) neoplasm, unspecified: Secondary | ICD-10-CM

## 2010-06-24 MED ORDER — IOHEXOL 300 MG/ML  SOLN
80.0000 mL | Freq: Once | INTRAMUSCULAR | Status: AC | PRN
  Administered 2010-06-24: 80 mL via INTRAVENOUS

## 2010-06-24 NOTE — Telephone Encounter (Signed)
Pt was called last pm w/results from MRI/MRA she is sch to see Dr Franky Macho at [facility] 6/1 at 8:45 and sch for CT at our office at 10:30, she will come by our office at 8:30 to get her contrast which she will drink at 8:30 and 9:30

## 2010-06-28 ENCOUNTER — Telehealth: Payer: Self-pay | Admitting: Internal Medicine

## 2010-06-28 NOTE — Telephone Encounter (Signed)
I talked with Steward Drone at the [facility]. I do not see notes documenting a referral to the [facility] from Dr Gala Romney. Steward Drone will contact Dr Debby Bud office for referral

## 2010-06-28 NOTE — Telephone Encounter (Signed)
LMTCB

## 2010-06-28 NOTE — Telephone Encounter (Signed)
Pt at a consult now for her hearing and told them dr bensimhon referred her, she states she called several weeks ago and talk to a nurse and was told she would fax it, I do not see any documentation on this, she is there now and brenda wants to know if she can get a refferal faxed just saying she needs a Hearing consult-no cover sheet needed

## 2010-07-01 ENCOUNTER — Telehealth: Payer: Self-pay | Admitting: Internal Medicine

## 2010-07-01 NOTE — Telephone Encounter (Signed)
Pt calling wanting to discuss her diagnosis--advised i would let dr hochrein know she would like to speak to him--nt

## 2010-07-01 NOTE — Telephone Encounter (Signed)
Discuss recent diagnosis

## 2010-07-04 ENCOUNTER — Telehealth: Payer: Self-pay | Admitting: Internal Medicine

## 2010-07-04 NOTE — Telephone Encounter (Signed)
Pt had MRI done and it shown a tumor on the brain and the family was wondering has she been referred to an oncolgilst and they are very concerned because pt is not sure what is going on

## 2010-07-04 NOTE — Telephone Encounter (Signed)
Per Dr Gala Romney need to speak w/Dr Franky Macho about his plans, will call them

## 2010-07-04 NOTE — Telephone Encounter (Signed)
[name] sent this to me by mistake?

## 2010-07-04 NOTE — Telephone Encounter (Signed)
We need to call Dr. Mikal Plane to see what his plans are.

## 2010-07-05 NOTE — Telephone Encounter (Signed)
This note came to me by mistake.

## 2010-07-06 NOTE — Telephone Encounter (Signed)
See phone note dated 6/11

## 2010-07-08 NOTE — Telephone Encounter (Addendum)
Dr Gala Romney has spoken w/Dr Franky Macho and they have referred pt to [facility], she is going there on 6/21, Dr Gala Romney states he has spoken w/pt's son

## 2010-08-01 ENCOUNTER — Ambulatory Visit: Payer: Medicare Other | Admitting: Physical Therapy

## 2010-08-03 ENCOUNTER — Ambulatory Visit: Payer: Medicare Other | Attending: General Practice | Admitting: Physical Therapy

## 2010-08-03 DIAGNOSIS — R269 Unspecified abnormalities of gait and mobility: Secondary | ICD-10-CM | POA: Insufficient documentation

## 2010-08-03 DIAGNOSIS — M6281 Muscle weakness (generalized): Secondary | ICD-10-CM | POA: Insufficient documentation

## 2010-08-03 DIAGNOSIS — R5381 Other malaise: Secondary | ICD-10-CM | POA: Insufficient documentation

## 2010-08-03 DIAGNOSIS — IMO0001 Reserved for inherently not codable concepts without codable children: Secondary | ICD-10-CM | POA: Insufficient documentation

## 2010-08-09 ENCOUNTER — Ambulatory Visit: Payer: Medicare Other | Admitting: Physical Therapy

## 2010-08-12 ENCOUNTER — Ambulatory Visit: Payer: Medicare Other | Admitting: Physical Therapy

## 2010-08-16 ENCOUNTER — Ambulatory Visit: Payer: Medicare Other | Admitting: Rehabilitative and Restorative Service Providers"

## 2010-08-19 ENCOUNTER — Ambulatory Visit: Payer: Medicare Other | Admitting: Physical Therapy

## 2010-08-23 ENCOUNTER — Ambulatory Visit: Payer: Medicare Other | Admitting: Rehabilitative and Restorative Service Providers"

## 2010-08-25 ENCOUNTER — Ambulatory Visit: Payer: Medicare Other | Admitting: Rehabilitative and Restorative Service Providers"

## 2010-08-29 ENCOUNTER — Encounter (HOSPITAL_BASED_OUTPATIENT_CLINIC_OR_DEPARTMENT_OTHER): Payer: Medicare Other | Admitting: Oncology

## 2010-08-29 DIAGNOSIS — C8589 Other specified types of non-Hodgkin lymphoma, extranodal and solid organ sites: Secondary | ICD-10-CM

## 2010-08-30 ENCOUNTER — Ambulatory Visit: Payer: Medicare Other | Admitting: Rehabilitative and Restorative Service Providers"

## 2010-08-30 ENCOUNTER — Other Ambulatory Visit: Payer: Self-pay | Admitting: Oncology

## 2010-08-30 ENCOUNTER — Encounter (HOSPITAL_BASED_OUTPATIENT_CLINIC_OR_DEPARTMENT_OTHER): Payer: Medicare Other | Admitting: Oncology

## 2010-08-30 DIAGNOSIS — C8589 Other specified types of non-Hodgkin lymphoma, extranodal and solid organ sites: Secondary | ICD-10-CM

## 2010-08-31 ENCOUNTER — Encounter (HOSPITAL_BASED_OUTPATIENT_CLINIC_OR_DEPARTMENT_OTHER): Payer: Medicare Other | Admitting: Oncology

## 2010-08-31 DIAGNOSIS — Z5112 Encounter for antineoplastic immunotherapy: Secondary | ICD-10-CM

## 2010-08-31 DIAGNOSIS — C8589 Other specified types of non-Hodgkin lymphoma, extranodal and solid organ sites: Secondary | ICD-10-CM

## 2010-08-31 LAB — HEPATITIS B SURFACE ANTIBODY,QUALITATIVE: Hep B S Ab: NEGATIVE

## 2010-08-31 LAB — HEPATITIS B CORE ANTIBODY, TOTAL: Hep B Core Total Ab: NEGATIVE

## 2010-09-01 ENCOUNTER — Ambulatory Visit: Payer: Medicare Other | Admitting: Rehabilitative and Restorative Service Providers"

## 2010-09-07 ENCOUNTER — Encounter (HOSPITAL_BASED_OUTPATIENT_CLINIC_OR_DEPARTMENT_OTHER): Payer: Medicare Other | Admitting: Oncology

## 2010-09-07 DIAGNOSIS — N189 Chronic kidney disease, unspecified: Secondary | ICD-10-CM

## 2010-09-07 DIAGNOSIS — I129 Hypertensive chronic kidney disease with stage 1 through stage 4 chronic kidney disease, or unspecified chronic kidney disease: Secondary | ICD-10-CM

## 2010-09-07 DIAGNOSIS — C8589 Other specified types of non-Hodgkin lymphoma, extranodal and solid organ sites: Secondary | ICD-10-CM

## 2010-09-07 DIAGNOSIS — Z5112 Encounter for antineoplastic immunotherapy: Secondary | ICD-10-CM

## 2010-09-16 ENCOUNTER — Other Ambulatory Visit: Payer: Self-pay | Admitting: Oncology

## 2010-09-16 ENCOUNTER — Encounter (HOSPITAL_BASED_OUTPATIENT_CLINIC_OR_DEPARTMENT_OTHER): Payer: Medicare Other | Admitting: Oncology

## 2010-09-16 DIAGNOSIS — C8589 Other specified types of non-Hodgkin lymphoma, extranodal and solid organ sites: Secondary | ICD-10-CM

## 2010-09-16 DIAGNOSIS — Z5112 Encounter for antineoplastic immunotherapy: Secondary | ICD-10-CM

## 2010-09-16 LAB — CBC WITH DIFFERENTIAL/PLATELET
Basophils Absolute: 0.1 10*3/uL (ref 0.0–0.1)
Eosinophils Absolute: 0.3 10*3/uL (ref 0.0–0.5)
HCT: 37.2 % (ref 34.8–46.6)
HGB: 13 g/dL (ref 11.6–15.9)
MCV: 100.4 fL (ref 79.5–101.0)
NEUT#: 3 10*3/uL (ref 1.5–6.5)
NEUT%: 62 % (ref 38.4–76.8)
RDW: 12.6 % (ref 11.2–14.5)
lymph#: 0.8 10*3/uL — ABNORMAL LOW (ref 0.9–3.3)

## 2010-09-16 LAB — COMPREHENSIVE METABOLIC PANEL
Albumin: 3.7 g/dL (ref 3.5–5.2)
BUN: 31 mg/dL — ABNORMAL HIGH (ref 6–23)
Calcium: 9.3 mg/dL (ref 8.4–10.5)
Chloride: 100 mEq/L (ref 96–112)
Glucose, Bld: 122 mg/dL — ABNORMAL HIGH (ref 70–99)
Potassium: 4.3 mEq/L (ref 3.5–5.3)

## 2010-09-23 ENCOUNTER — Other Ambulatory Visit: Payer: Self-pay | Admitting: Oncology

## 2010-09-23 ENCOUNTER — Ambulatory Visit: Payer: Medicare Other | Admitting: Internal Medicine

## 2010-09-23 ENCOUNTER — Encounter (HOSPITAL_BASED_OUTPATIENT_CLINIC_OR_DEPARTMENT_OTHER): Payer: Medicare Other | Admitting: Oncology

## 2010-09-23 DIAGNOSIS — C8589 Other specified types of non-Hodgkin lymphoma, extranodal and solid organ sites: Secondary | ICD-10-CM

## 2010-09-23 DIAGNOSIS — Z5112 Encounter for antineoplastic immunotherapy: Secondary | ICD-10-CM

## 2010-09-23 LAB — BASIC METABOLIC PANEL
Chloride: 99 mEq/L (ref 96–112)
Potassium: 4.2 mEq/L (ref 3.5–5.3)
Sodium: 136 mEq/L (ref 135–145)

## 2010-09-29 ENCOUNTER — Other Ambulatory Visit: Payer: Self-pay | Admitting: Oncology

## 2010-09-29 ENCOUNTER — Encounter (HOSPITAL_BASED_OUTPATIENT_CLINIC_OR_DEPARTMENT_OTHER): Payer: Medicare Other | Admitting: Oncology

## 2010-09-29 DIAGNOSIS — C8589 Other specified types of non-Hodgkin lymphoma, extranodal and solid organ sites: Secondary | ICD-10-CM

## 2010-09-29 DIAGNOSIS — C8590 Non-Hodgkin lymphoma, unspecified, unspecified site: Secondary | ICD-10-CM

## 2010-10-07 ENCOUNTER — Ambulatory Visit (HOSPITAL_COMMUNITY)
Admission: RE | Admit: 2010-10-07 | Discharge: 2010-10-07 | Disposition: A | Discharge status: Home / Self Care | Payer: Medicare Other | Source: Ambulatory Visit | Attending: Oncology | Admitting: Oncology

## 2010-10-07 DIAGNOSIS — G930 Cerebral cysts: Secondary | ICD-10-CM | POA: Insufficient documentation

## 2010-10-07 DIAGNOSIS — G319 Degenerative disease of nervous system, unspecified: Secondary | ICD-10-CM | POA: Insufficient documentation

## 2010-10-07 DIAGNOSIS — C8590 Non-Hodgkin lymphoma, unspecified, unspecified site: Secondary | ICD-10-CM

## 2010-10-07 DIAGNOSIS — G9389 Other specified disorders of brain: Secondary | ICD-10-CM | POA: Insufficient documentation

## 2010-10-07 MED ORDER — GADOBENATE DIMEGLUMINE 529 MG/ML IV SOLN
6.0000 mL | Freq: Once | INTRAVENOUS | Status: AC
  Administered 2010-10-07: 6 mL via INTRAVENOUS

## 2010-10-12 ENCOUNTER — Encounter (HOSPITAL_BASED_OUTPATIENT_CLINIC_OR_DEPARTMENT_OTHER): Payer: Medicare Other | Admitting: Oncology

## 2010-10-12 DIAGNOSIS — R413 Other amnesia: Secondary | ICD-10-CM

## 2010-10-12 DIAGNOSIS — R269 Unspecified abnormalities of gait and mobility: Secondary | ICD-10-CM

## 2010-10-12 DIAGNOSIS — C8581 Other specified types of non-Hodgkin lymphoma, lymph nodes of head, face, and neck: Secondary | ICD-10-CM

## 2010-10-12 DIAGNOSIS — N189 Chronic kidney disease, unspecified: Secondary | ICD-10-CM

## 2010-11-16 ENCOUNTER — Other Ambulatory Visit: Payer: Self-pay | Admitting: Oncology

## 2010-11-16 ENCOUNTER — Encounter (HOSPITAL_BASED_OUTPATIENT_CLINIC_OR_DEPARTMENT_OTHER): Payer: Medicare Other | Admitting: Oncology

## 2010-11-16 DIAGNOSIS — C8589 Other specified types of non-Hodgkin lymphoma, extranodal and solid organ sites: Secondary | ICD-10-CM

## 2010-11-16 DIAGNOSIS — N189 Chronic kidney disease, unspecified: Secondary | ICD-10-CM

## 2010-11-16 DIAGNOSIS — N289 Disorder of kidney and ureter, unspecified: Secondary | ICD-10-CM

## 2010-11-16 DIAGNOSIS — I129 Hypertensive chronic kidney disease with stage 1 through stage 4 chronic kidney disease, or unspecified chronic kidney disease: Secondary | ICD-10-CM

## 2010-11-16 DIAGNOSIS — R0601 Orthopnea: Secondary | ICD-10-CM

## 2010-11-16 DIAGNOSIS — Z5112 Encounter for antineoplastic immunotherapy: Secondary | ICD-10-CM

## 2010-11-16 LAB — CBC WITH DIFFERENTIAL/PLATELET
Basophils Absolute: 0 10*3/uL (ref 0.0–0.1)
Eosinophils Absolute: 0.4 10*3/uL (ref 0.0–0.5)
HCT: 38.8 % (ref 34.8–46.6)
HGB: 13.5 g/dL (ref 11.6–15.9)
MCV: 97 fL (ref 79.5–101.0)
MONO%: 15.8 % — ABNORMAL HIGH (ref 0.0–14.0)
NEUT#: 2.9 10*3/uL (ref 1.5–6.5)
Platelets: 203 10*3/uL (ref 145–400)
RDW: 12.5 % (ref 11.2–14.5)

## 2010-11-17 ENCOUNTER — Ambulatory Visit (INDEPENDENT_AMBULATORY_CARE_PROVIDER_SITE_OTHER): Payer: Medicare Other | Admitting: Cardiology

## 2010-11-17 ENCOUNTER — Institutional Professional Consult (permissible substitution): Payer: Medicare Other | Admitting: Cardiology

## 2010-11-17 ENCOUNTER — Encounter: Payer: Self-pay | Admitting: Cardiology

## 2010-11-17 VITALS — BP 132/96 | HR 75 | Ht 64.0 in | Wt 134.0 lb

## 2010-11-17 DIAGNOSIS — R0602 Shortness of breath: Secondary | ICD-10-CM

## 2010-11-17 DIAGNOSIS — I10 Essential (primary) hypertension: Secondary | ICD-10-CM

## 2010-11-17 DIAGNOSIS — Z954 Presence of other heart-valve replacement: Secondary | ICD-10-CM

## 2010-11-17 DIAGNOSIS — Z952 Presence of prosthetic heart valve: Secondary | ICD-10-CM

## 2010-11-17 NOTE — Assessment & Plan Note (Signed)
I am not clear of the etiology of this. I do not strongly suspect significant volume overload. She's had a preserved ejection fraction recently. I will check a BNP level. I've asked her to take her Lasix at least for the next few days to see if this improves her symptoms. Further management will be based on her lab results and symptoms.

## 2010-11-17 NOTE — Patient Instructions (Signed)
Please have blood work today  Take Lasix for 2 days The current medical regimen is effective;  continue present plan and medications.  Follow up in 2 months with Dr Antoine Poche.

## 2010-11-17 NOTE — Progress Notes (Signed)
HPI The patient presents for evaluation of shortness of breath. She has a history of an ASD repair in 2007.  She had a cardiomyopathy at that time with an EF of 25%. However, followup echoes including one done in March of this year demonstrated a well preserved ejection fraction. She has a stable bioprosthetic valve replacement. 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 She actually did quite well with this apparently and did have chemotherapy. She was apparently seen her oncologist recently and described some shortness of breath. They thought she was perhaps volume overloaded. She is supposed to be taking 20 mg of Lasix daily but she doesn't like to do this and so she hasn't been on any. She does describe some dyspnea with exertion. She does sleep on 2 pillows which is fairly new. However, she is not describing any classic PND. She's not having any chest pressure, neck or arm discomfort. She has stable weights. She does have some mild lower extremity swelling. She's not been having any cough heaters or chills. She has a chronically unsteady gait. Today she had some left hand and arm numbness while driving over here though she denies any visual disturbances loss of motor or speech. I did review an MRI done recently which demonstrated no evidence of recurrent lymphoma or other abnormalities.  No Known Allergies  Current Outpatient Prescriptions  Medication Sig Dispense Refill  . furosemide (LASIX) 20 MG tablet Take 1 tablet (20 mg total) by mouth daily.      . metoprolol (TOPROL-XL) 50 MG 24 hr tablet Take 50 mg by mouth daily.        . folic acid (FOLVITE) 1 MG tablet TAKE 1 TABLET ONCE DAILY.  30 tablet  3  . NON FORMULARY Vein Ease (Horse Chesnutt Seed/ Ginkgo Biloba), once daily       . ramipril (ALTACE) 2.5 MG capsule Take 1 capsule (2.5 mg total) by mouth daily.  30 capsule  6    Past Medical History  Diagnosis Date  . Nonischemic  cardiomyopathy 2007 / Feb 2009    Minial non-obs CAD cath (2007); EF previously 25%; most recent EF 55-65% (Feb 2009)  . Aortic insufficiency October 2007    Severe with ascending thoracic aneurysm; s/p pericardial tissue AVR and Heamsheild graft in October 2007; c/b pericardial effusion requiring window  . Hyperlipidemia   . Hypertension   . GERD (gastroesophageal reflux disease)   . History of cholelithiasis     Gallbladder dysfunction  . History of non anemic vitamin B12 deficiency   . Allergic rhinitis   . TIA (transient ischemic attack) October 2010    Past Surgical History  Procedure Date  . Tonsillectomy   . Aortic valve replacement 2007  . Bilateral vein stripping     ROS:  Headache.  Otherwise as stated in the HPI and negative for all other systems.   PHYSICAL EXAM BP 132/96  Pulse 75  Ht 5\' 4"  (1.626 m)  Wt 134 lb (60.782 kg)  BMI 23.00 kg/m2 GENERAL:  Well appearing HEENT:  Pupils equal round and reactive, fundi not visualized, oral mucosa unremarkable NECK:  No jugular venous distention, waveform within normal limits, carotid upstroke brisk and symmetric, no bruits, no thyromegaly LYMPHATICS:  No cervical, inguinal adenopathy LUNGS:  Clear to auscultation bilaterally BACK:  No CVA tenderness CHEST:  Unremarkable HEART:  PMI not displaced or sustained,S1 and S2 within normal limits, no S3, no  S4, no clicks, no rubs, no murmurs ABD:  Flat, positive bowel sounds normal in frequency in pitch, no bruits, no rebound, no guarding, no midline pulsatile mass, no hepatomegaly, no splenomegaly EXT:  2 plus pulses throughout, no edema, no cyanosis no clubbing SKIN:  No rashes no nodules NEURO:  Cranial nerves II through XII grossly intact, motor grossly intact throughout PSYCH:  Cognitively intact, oriented to person place and time  EKG:  Sinus rhythm, rate 75, axis within normal limits, intervals within normal limits, lateral T-wave inversions unchanged from  previous  ASSESSMENT AND PLAN

## 2010-11-17 NOTE — Assessment & Plan Note (Signed)
I did review her recent echo as above and she had a stable valve repair. Her exam is unchanged. At this point I am not planning repeat echocardiography pending the above results.

## 2010-11-17 NOTE — Assessment & Plan Note (Signed)
Her blood pressure is controlled. She will continue meds as listed.

## 2010-11-18 LAB — BRAIN NATRIURETIC PEPTIDE: Pro B Natriuretic peptide (BNP): 246 pg/mL — ABNORMAL HIGH (ref 0.0–100.0)

## 2010-11-24 ENCOUNTER — Other Ambulatory Visit: Payer: Self-pay | Admitting: *Deleted

## 2010-11-24 DIAGNOSIS — I5022 Chronic systolic (congestive) heart failure: Secondary | ICD-10-CM

## 2010-11-24 DIAGNOSIS — Z79899 Other long term (current) drug therapy: Secondary | ICD-10-CM

## 2010-11-24 DIAGNOSIS — R0602 Shortness of breath: Secondary | ICD-10-CM

## 2010-11-30 ENCOUNTER — Telehealth: Payer: Self-pay | Admitting: Cardiology

## 2010-11-30 NOTE — Telephone Encounter (Signed)
Patient would like to know what to take for  Headache. She has a history of headaches since having surgery of brain CA. Patient can take 2 tylenol by mouth one dose today. Patient  needs to call her PCP for  Recommendations for medication for headache. Patient verbalized understanding.

## 2010-11-30 NOTE — Telephone Encounter (Signed)
Pt calling wanting to know what OTC medications pt can take to relieve head ache. Please return pt call to discuss further.

## 2010-12-01 ENCOUNTER — Other Ambulatory Visit: Payer: Medicare Other | Admitting: *Deleted

## 2010-12-14 ENCOUNTER — Other Ambulatory Visit: Payer: Medicare Other | Admitting: Lab

## 2010-12-14 ENCOUNTER — Ambulatory Visit: Payer: Medicare Other | Admitting: Oncology

## 2010-12-14 ENCOUNTER — Telehealth: Payer: Self-pay | Admitting: *Deleted

## 2010-12-14 NOTE — Telephone Encounter (Signed)
LEFT PATIENT VOICE MESSAGE INFORMING THE PATIENT OF THE NEW DATE AND TIME.

## 2010-12-15 ENCOUNTER — Other Ambulatory Visit: Payer: Self-pay | Admitting: Oncology

## 2010-12-16 ENCOUNTER — Other Ambulatory Visit: Payer: Self-pay | Admitting: *Deleted

## 2010-12-16 ENCOUNTER — Other Ambulatory Visit: Payer: Self-pay | Admitting: Internal Medicine

## 2010-12-16 ENCOUNTER — Other Ambulatory Visit: Payer: Self-pay | Admitting: Physician Assistant

## 2010-12-17 ENCOUNTER — Telehealth: Payer: Self-pay | Admitting: Oncology

## 2010-12-17 NOTE — Telephone Encounter (Signed)
S/w the pt's dtr and she is aware of the appts on 01/11/2011@10 :30am

## 2010-12-19 ENCOUNTER — Telehealth: Payer: Self-pay | Admitting: *Deleted

## 2010-12-19 NOTE — Telephone Encounter (Signed)
Son left VM asking nurse to call about December appointment. He has questions.

## 2010-12-19 NOTE — Telephone Encounter (Signed)
Left message on voice mail with date/time of appointment of 12/19. Call scheduling dept. If he has more questions.

## 2010-12-21 ENCOUNTER — Telehealth: Payer: Self-pay | Admitting: *Deleted

## 2010-12-21 ENCOUNTER — Ambulatory Visit (INDEPENDENT_AMBULATORY_CARE_PROVIDER_SITE_OTHER): Payer: Medicare Other | Admitting: Internal Medicine

## 2010-12-21 DIAGNOSIS — C8599 Non-Hodgkin lymphoma, unspecified, extranodal and solid organ sites: Secondary | ICD-10-CM

## 2010-12-21 DIAGNOSIS — I509 Heart failure, unspecified: Secondary | ICD-10-CM

## 2010-12-21 DIAGNOSIS — C8589 Other specified types of non-Hodgkin lymphoma, extranodal and solid organ sites: Secondary | ICD-10-CM

## 2010-12-21 DIAGNOSIS — I5032 Chronic diastolic (congestive) heart failure: Secondary | ICD-10-CM

## 2010-12-21 NOTE — Telephone Encounter (Signed)
Pt's daughter in law called and states that they were out shopping today she started feeling weak and clammy. Pt took her blood pressure at pharm and read 142/84. Pt states she doesn't feel well and would like to be seen today. What do you advise

## 2010-12-24 DIAGNOSIS — C8599 Non-Hodgkin lymphoma, unspecified, extranodal and solid organ sites: Secondary | ICD-10-CM | POA: Insufficient documentation

## 2010-12-24 NOTE — Assessment & Plan Note (Signed)
Patient presents for evaluation of dizziness and weakness episode earlier today that was transient. Her exam is non-focal with excellent movement.  Plan - continue observation - no indication for imaging on consultation at this time. For persistent symptoms will need re-imaging brain and possible neuro consult.

## 2010-12-24 NOTE — Progress Notes (Signed)
Subjective:    Patient ID: [name], female    DOB: [birth year], 77 y.o.   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 In the interval she was noted by Dr. Gala Romney to have ataxia. Further evaluation lead to a diagnosis of B-cell lymphoma with brain tumor. She is s/o left frontal temporal lob resection and retuximibe therapy. She was noted to have post-op ataxia that has been mild. She had a restaging MRI brain Sept '14th, '12 which reveals surgical changes but no evidence of recurrence, although there was some peripheral enhancement. She has been w/o neurologic symptoms. She is seen acutely due to an episode in this afternoon of sudden on-set dizziness and worsened ataxia. She also has had a recent episode of diplopia. She has no c/o headache, focal paresthesia or weakness, persistent visual change, cognitive change or impairment.  She has a h/o cardiomyopathy with an initial EF 25%. She has recently seend Dr. Antoine Poche, Oct 25th, for SOB on referral from oncology. Her exam was normal. Her last 2D echo in March '12 - with an EF 60%  Past Medical History  Diagnosis Date  . Nonischemic cardiomyopathy 2007 / Feb 2009    Minial non-obs CAD cath (2007); EF previously 25%; most recent EF 55-65% (Feb 2009)  . Aortic insufficiency October 2007    Severe with ascending thoracic aneurysm; s/p pericardial tissue AVR and Heamsheild graft in October 2007; c/b pericardial effusion requiring window  . Hyperlipidemia   . Hypertension   . GERD (gastroesophageal reflux disease)   . History of cholelithiasis     Gallbladder dysfunction  . History of non anemic vitamin B12 deficiency   . Allergic rhinitis   . TIA (transient ischemic attack) October 2010  . Brain cancer   . CNS lymphoma    Past Surgical History  Procedure Date  . Tonsillectomy   . Aortic valve replacement 2007  . Bilateral vein stripping   . Left temporal tumor resected    Family History    Problem Relation Age of Onset  . Stroke Father   . Arthritis Other   . Coronary artery disease Other     Female 1st degree relative <50  . Diabetes Other   . Cerebral aneurysm Son    History   Social History  . Marital Status: Widowed    Spouse Name: N/A    Number of Children: N/A  . Years of Education: N/A   Occupational History  . Not on file.   Social History Main Topics  . Smoking status: Never Smoker   . Smokeless tobacco: Never Used  . Alcohol Use: No  . Drug Use: No  . Sexually Active: Not on file   Other Topics Concern  . Not on file   Social History Narrative   HSGMarried @ [phone], 60 years/ widowed 2007[place] was a housewifeNo h/o abuseEnd-of-Life: No CPR; DNI; no prolonged tube feeding.  Provided signed "Out of Facility Order" and completed and signed MOST form.  These should accompany her to hospital, etc.  She should share her wished with the family (04/01/10)      Review of Systems  Constitutional: Negative.   HENT: Negative.   Eyes: Positive for visual disturbance.       Transient diplopia  Cardiovascular: Negative for chest pain, palpitations and leg swelling.  Gastrointestinal: Negative.   Musculoskeletal: Negative.   Neurological: Positive for dizziness and light-headedness. Negative for seizures, syncope, facial asymmetry,  speech difficulty, weakness, numbness and headaches.  Hematological: Negative.   Psychiatric/Behavioral: Negative.        Objective:   Physical Exam Vitals - normal Gen'l - WNWD white woman who looks younger that her chronologic age of 89 HEENT - C&S clear Cor- 2+ radial, RRR Pulm - normal respirations. Neuro- A&O x 3, speech is clear, cognition is normal (memory not tested), CN - nl facial symmetry and movement, PERRLA, EOMI, no deviation or fasiculation of the tongue, nl shrug. MS 5/5 and equal. Cerebellar - no tremor, no pronator drift, nl gait, mild ataxia with tandem gait, nl finger-to-nose, nl rapid  alternating finger movement, no dysdiadochokinesia.       Assessment & Plan:

## 2010-12-24 NOTE — Assessment & Plan Note (Addendum)
No symptoms or signs of decompensation  Plan - continue present medications.

## 2010-12-29 ENCOUNTER — Telehealth: Payer: Self-pay | Admitting: *Deleted

## 2010-12-29 NOTE — Telephone Encounter (Signed)
Called pt's son. Asking when will pt be scheduled for another MRI. Per Dr. Truett Perna: 6mos from last MRI. Pt's son reports she had a headache over Thanksgiving with diplopia, this resolved without intervention. Instructed him to take her to ED for headache that interferes with vision or gait that is not resolved with medication. He verbalized understanding. Next appt confirmed.

## 2010-12-30 NOTE — Telephone Encounter (Signed)
Pt was seen by Dr Debby Bud same day

## 2011-01-06 ENCOUNTER — Other Ambulatory Visit (INDEPENDENT_AMBULATORY_CARE_PROVIDER_SITE_OTHER): Payer: Medicare Other | Admitting: *Deleted

## 2011-01-06 DIAGNOSIS — I5022 Chronic systolic (congestive) heart failure: Secondary | ICD-10-CM

## 2011-01-06 DIAGNOSIS — R0602 Shortness of breath: Secondary | ICD-10-CM

## 2011-01-06 DIAGNOSIS — Z79899 Other long term (current) drug therapy: Secondary | ICD-10-CM

## 2011-01-06 LAB — BASIC METABOLIC PANEL
CO2: 28 mEq/L (ref 19–32)
Chloride: 104 mEq/L (ref 96–112)
Creatinine, Ser: 1.6 mg/dL — ABNORMAL HIGH (ref 0.4–1.2)

## 2011-01-09 ENCOUNTER — Ambulatory Visit (INDEPENDENT_AMBULATORY_CARE_PROVIDER_SITE_OTHER): Payer: Medicare Other | Admitting: Cardiology

## 2011-01-09 ENCOUNTER — Encounter: Payer: Self-pay | Admitting: Cardiology

## 2011-01-09 DIAGNOSIS — I5030 Unspecified diastolic (congestive) heart failure: Secondary | ICD-10-CM

## 2011-01-09 DIAGNOSIS — I5032 Chronic diastolic (congestive) heart failure: Secondary | ICD-10-CM

## 2011-01-09 DIAGNOSIS — R269 Unspecified abnormalities of gait and mobility: Secondary | ICD-10-CM

## 2011-01-09 DIAGNOSIS — I10 Essential (primary) hypertension: Secondary | ICD-10-CM

## 2011-01-09 DIAGNOSIS — I509 Heart failure, unspecified: Secondary | ICD-10-CM

## 2011-01-09 DIAGNOSIS — I359 Nonrheumatic aortic valve disorder, unspecified: Secondary | ICD-10-CM

## 2011-01-09 NOTE — Assessment & Plan Note (Signed)
We discussed her ongoing balance issues and precautions against falling.

## 2011-01-09 NOTE — Assessment & Plan Note (Signed)
I reviewed her previous echo. She has a well preserved ejection fraction. She has some diastolic dysfunction. She doesn't want to take the diuretic every day because she's out of the house. I encouraged her to take it particularly if she staying at home or if she has increased swelling or dyspnea. We talked at length about salt and fluid restriction. I will keep a close eye on her creatinine. She will get a basic metabolic profile in one month.

## 2011-01-09 NOTE — Patient Instructions (Signed)
Please return for blood work in one month.  The current medical regimen is effective;  continue present plan and medications.  Follow up with Dr Antoine Poche in 3 months.

## 2011-01-09 NOTE — Assessment & Plan Note (Signed)
I have no reason to suspect any new problem with her heart valve and we will follow this clinically.

## 2011-01-09 NOTE — Progress Notes (Signed)
HPI The patient presents for evaluation of shortness of breath. She has a history of an ASD repair in 2007.  She had a cardiomyopathy at that time with an EF of 25%. However, followup echoes including one done in March of this year demonstrated a well preserved ejection fraction. She has a stable bioprosthetic valve replacement. Earlier this year she was found to have a CNS lymphoma and had left temporal lobe resection in [place]. She actually did quite well with this apparently and did have chemotherapy.  She presents for followup of dyspnea. At the last appointment I encouraged her to take her diuretic. I checked a BNP which was very slightly elevated but actually lower than previous. Her creatinine is slightly elevated. She denies any new shortness of breath. She will occasionally get dyspneic. She describes some rare dizziness but no presyncope or syncope. She's thinking of getting a cane because she's had some gait disturbance. She denies any chest pressure, neck or arm discomfort. She has had no weight gain.  She has mild ankle edema.   No Known Allergies  Current Outpatient Prescriptions  Medication Sig Dispense Refill  . folic acid (FOLVITE) 1 MG tablet TAKE 1 TABLET ONCE DAILY.  30 tablet  5  . furosemide (LASIX) 20 MG tablet Take 1 tablet (20 mg total) by mouth daily.      . metoprolol (TOPROL-XL) 50 MG 24 hr tablet Take 50 mg by mouth daily.        . NON FORMULARY Vein Ease (H[employer] [place]nut[employer] Seed/ Ginkgo Biloba), once daily       . ramipril (ALTACE) 2.5 MG capsule Take 1 capsule (2.5 mg total) by mouth daily.  30 capsule  6    Past Medical History  Diagnosis Date  . Nonischemic cardiomyopathy 2007 / Feb 2009    Minial non-obs CAD cath (2007); EF previously 25%; most recent EF 55-65% (Feb 2009)  . Aortic insufficiency October 2007    Severe with ascending thoracic aneurysm; s/p pericardial tissue AVR and Heamsheild graft in October 2007; c/b pericardial effusion requiring window  .  Hyperlipidemia   . Hypertension   . GERD (gastroesophageal reflux disease)   . History of cholelithiasis     Gallbladder dysfunction  . History of non anemic vitamin B12 deficiency   . Allergic rhinitis   . TIA (transient ischemic attack) October 2010  . Brain cancer   . CNS lymphoma     Past Surgical History  Procedure Date  . Tonsillectomy   . Aortic valve replacement 2007  . Bilateral vein stripping   . Left temporal tumor resected    ROS: Unbalanced.  Otherwise as stated in the HPI and negative for all other systems.  PHYSICAL EXAM BP 130/70  Pulse 78  Ht 5\' 4"  (1.626 m)  Wt 137 lb 6.4 oz (62.324 kg)  BMI 23.58 kg/m2 GENERAL:  Well appearing HEENT:  Pupils equal round and reactive, fundi not visualized, oral mucosa unremarkable NECK:  No jugular venous distention, waveform within normal limits, carotid upstroke brisk and symmetric, no bruits, no thyromegaly LYMPHATICS:  No cervical, inguinal adenopathy LUNGS:  Clear to auscultation bilaterally BACK:  No CVA tenderness CHEST:  Well healed sternotomy scar. HEART:  PMI not displaced or sustained,S1 and S2 within normal limits, no S3, no S4, no clicks, no rubs, no murmurs ABD:  Flat, positive bowel sounds normal in frequency in pitch, no bruits, no rebound, no guarding, no midline pulsatile mass, no hepatomegaly, no splenomegaly EXT:  2  plus pulses throughout, no edema, no cyanosis no clubbing SKIN:  No rashes no nodules NEURO:  Cranial nerves II through XII grossly intact, motor grossly intact throughout PSYCH:  Cognitively intact, oriented to person place and time  ASSESSMENT AND PLAN

## 2011-01-09 NOTE — Assessment & Plan Note (Signed)
The blood pressure is at target. No change in medications is indicated. We will continue with therapeutic lifestyle changes (TLC).  

## 2011-01-11 ENCOUNTER — Other Ambulatory Visit: Payer: Self-pay | Admitting: Oncology

## 2011-01-11 ENCOUNTER — Other Ambulatory Visit (HOSPITAL_BASED_OUTPATIENT_CLINIC_OR_DEPARTMENT_OTHER): Payer: Medicare Other | Admitting: Lab

## 2011-01-11 ENCOUNTER — Ambulatory Visit (HOSPITAL_BASED_OUTPATIENT_CLINIC_OR_DEPARTMENT_OTHER): Payer: Medicare Other | Admitting: Oncology

## 2011-01-11 ENCOUNTER — Other Ambulatory Visit: Payer: Self-pay | Admitting: *Deleted

## 2011-01-11 ENCOUNTER — Ambulatory Visit (HOSPITAL_BASED_OUTPATIENT_CLINIC_OR_DEPARTMENT_OTHER): Payer: Medicare Other

## 2011-01-11 VITALS — BP 137/75 | HR 61 | Temp 97.2°F

## 2011-01-11 VITALS — BP 151/85 | HR 69 | Temp 95.5°F | Ht 64.0 in | Wt 134.2 lb

## 2011-01-11 DIAGNOSIS — N189 Chronic kidney disease, unspecified: Secondary | ICD-10-CM

## 2011-01-11 DIAGNOSIS — C8581 Other specified types of non-Hodgkin lymphoma, lymph nodes of head, face, and neck: Secondary | ICD-10-CM

## 2011-01-11 DIAGNOSIS — I129 Hypertensive chronic kidney disease with stage 1 through stage 4 chronic kidney disease, or unspecified chronic kidney disease: Secondary | ICD-10-CM

## 2011-01-11 DIAGNOSIS — I10 Essential (primary) hypertension: Secondary | ICD-10-CM

## 2011-01-11 DIAGNOSIS — Z5112 Encounter for antineoplastic immunotherapy: Secondary | ICD-10-CM

## 2011-01-11 DIAGNOSIS — C8599 Non-Hodgkin lymphoma, unspecified, extranodal and solid organ sites: Secondary | ICD-10-CM

## 2011-01-11 LAB — CBC WITH DIFFERENTIAL/PLATELET
Basophils Absolute: 0 10*3/uL (ref 0.0–0.1)
Eosinophils Absolute: 0.3 10*3/uL (ref 0.0–0.5)
HCT: 38.3 % (ref 34.8–46.6)
LYMPH%: 18.3 % (ref 14.0–49.7)
MCHC: 34.2 g/dL (ref 31.5–36.0)
MONO#: 0.7 10*3/uL (ref 0.1–0.9)
NEUT#: 3.1 10*3/uL (ref 1.5–6.5)
NEUT%: 61 % (ref 38.4–76.8)
Platelets: 194 10*3/uL (ref 145–400)
WBC: 5.1 10*3/uL (ref 3.9–10.3)

## 2011-01-11 MED ORDER — DIPHENHYDRAMINE HCL 25 MG PO CAPS
50.0000 mg | ORAL_CAPSULE | Freq: Once | ORAL | Status: AC
  Administered 2011-01-11: 50 mg via ORAL

## 2011-01-11 MED ORDER — ACETAMINOPHEN 325 MG PO TABS
650.0000 mg | ORAL_TABLET | Freq: Once | ORAL | Status: AC
  Administered 2011-01-11: 650 mg via ORAL

## 2011-01-11 MED ORDER — SODIUM CHLORIDE 0.9 % IV SOLN
375.0000 mg/m2 | Freq: Once | INTRAVENOUS | Status: AC
  Administered 2011-01-11: 600 mg via INTRAVENOUS
  Filled 2011-01-11: qty 60

## 2011-01-11 MED ORDER — SODIUM CHLORIDE 0.9 % IV SOLN: 375.0000 mg/m2 | Freq: Once | INTRAVENOUS | Status: DC

## 2011-01-11 MED ORDER — SODIUM CHLORIDE 0.9 % IV SOLN
Freq: Once | INTRAVENOUS | Status: AC
  Administered 2011-01-11: 13:00:00 via INTRAVENOUS

## 2011-01-11 NOTE — Patient Instructions (Signed)
01/11/11  1640-Pt discharged ambulatory with next appointment confirmed.  Pt aware to call with any questions or concerns.

## 2011-01-12 ENCOUNTER — Telehealth: Payer: Self-pay | Admitting: Oncology

## 2011-01-12 NOTE — Telephone Encounter (Signed)
lmonvm of the pt regarding her mri brain scan appt on 02/06/2011@12 :noon

## 2011-01-13 ENCOUNTER — Telehealth: Payer: Self-pay | Admitting: *Deleted

## 2011-01-13 NOTE — Telephone Encounter (Signed)
Faxed over orders to [facility] for PT OT eval and treat. Referral was received. Pt will be seen 01/19/11.

## 2011-01-13 NOTE — Progress Notes (Signed)
OFFICE PROGRESS NOTE   INTERVAL HISTORY:   She returns as scheduled. She has an intermittent headache. She has no new neurologic symptoms. The ataxia is unchanged. She is bowling.  Objective:  Vital signs in last 24 hours:  Blood pressure 151/85, pulse 69, temperature 95.5 F (35.3 C), temperature source Oral, height 5\' 4"  (1.626 m), weight 134 lb 3.2 oz (60.873 kg).  Resp: Lungs clear bilaterally Cardio: Regular rate and rhythm GI: No hepatosplenomegaly Vascular: No leg edema Neuro: Alert and oriented. The motor exam appears grossly intact. She ambulates with a slightly unsteady gait.     Lab Results:  Lab Results  Component Value Date   WBC 5.1 01/11/2011   HGB 13.1 01/11/2011   HCT 38.3 01/11/2011   MCV 96.7 01/11/2011   PLT 194 01/11/2011   ANC 3.1   Medications: I have reviewed the patient's current medications.  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   a. Status post surgical resection at [facility] 07/20/2010 with the pathology confirming a diffuse large B-cell lymphoma.   b. Initiation of weekly rituximab therapy 08/31/2010, status post week number 4 on 09/23/2010. c. Restaging MRI of the brain 10/07/2010 without evidence of disease progression. d. Initiation of every 2 month "maintenance" rituximab on 11/16/2010. 2. Gait ataxia and memory deficit, question related to the central nervous system lymphoma.  She appears stable today. 3. Hypertension. 4. Nonischemic cardiomyopathy. 5. Status post aortic valve replacement. 6. Transient ischemic attack in 2010. 7. Chronic renal insufficiency.   Disposition:  She appears stable. She will complete another cycle of maintenance rituximab today. She will be scheduled for a restaging MRI of the brain and office visit in one month.   Norma Shutters, MD  01/13/2011  6:56 AM

## 2011-01-30 DIAGNOSIS — C8589 Other specified types of non-Hodgkin lymphoma, extranodal and solid organ sites: Secondary | ICD-10-CM | POA: Diagnosis not present

## 2011-01-30 DIAGNOSIS — R269 Unspecified abnormalities of gait and mobility: Secondary | ICD-10-CM | POA: Diagnosis not present

## 2011-01-30 DIAGNOSIS — N189 Chronic kidney disease, unspecified: Secondary | ICD-10-CM | POA: Diagnosis not present

## 2011-01-30 DIAGNOSIS — I129 Hypertensive chronic kidney disease with stage 1 through stage 4 chronic kidney disease, or unspecified chronic kidney disease: Secondary | ICD-10-CM | POA: Diagnosis not present

## 2011-01-30 DIAGNOSIS — I2589 Other forms of chronic ischemic heart disease: Secondary | ICD-10-CM | POA: Diagnosis not present

## 2011-01-30 DIAGNOSIS — Z5189 Encounter for other specified aftercare: Secondary | ICD-10-CM | POA: Diagnosis not present

## 2011-02-02 DIAGNOSIS — N189 Chronic kidney disease, unspecified: Secondary | ICD-10-CM | POA: Diagnosis not present

## 2011-02-02 DIAGNOSIS — Z5189 Encounter for other specified aftercare: Secondary | ICD-10-CM | POA: Diagnosis not present

## 2011-02-02 DIAGNOSIS — C8589 Other specified types of non-Hodgkin lymphoma, extranodal and solid organ sites: Secondary | ICD-10-CM | POA: Diagnosis not present

## 2011-02-02 DIAGNOSIS — I2589 Other forms of chronic ischemic heart disease: Secondary | ICD-10-CM | POA: Diagnosis not present

## 2011-02-02 DIAGNOSIS — R269 Unspecified abnormalities of gait and mobility: Secondary | ICD-10-CM | POA: Diagnosis not present

## 2011-02-02 DIAGNOSIS — I129 Hypertensive chronic kidney disease with stage 1 through stage 4 chronic kidney disease, or unspecified chronic kidney disease: Secondary | ICD-10-CM | POA: Diagnosis not present

## 2011-02-06 ENCOUNTER — Other Ambulatory Visit: Payer: Self-pay | Admitting: Oncology

## 2011-02-06 ENCOUNTER — Ambulatory Visit (HOSPITAL_COMMUNITY)
Admission: RE | Admit: 2011-02-06 | Discharge: 2011-02-06 | Disposition: A | Discharge status: Home / Self Care | Payer: Medicare Other | Source: Ambulatory Visit | Attending: Oncology | Admitting: Oncology

## 2011-02-06 DIAGNOSIS — R209 Unspecified disturbances of skin sensation: Secondary | ICD-10-CM | POA: Insufficient documentation

## 2011-02-06 DIAGNOSIS — Q048 Other specified congenital malformations of brain: Secondary | ICD-10-CM | POA: Diagnosis not present

## 2011-02-06 DIAGNOSIS — C8599 Non-Hodgkin lymphoma, unspecified, extranodal and solid organ sites: Secondary | ICD-10-CM

## 2011-02-06 DIAGNOSIS — R262 Difficulty in walking, not elsewhere classified: Secondary | ICD-10-CM | POA: Insufficient documentation

## 2011-02-06 DIAGNOSIS — C8589 Other specified types of non-Hodgkin lymphoma, extranodal and solid organ sites: Secondary | ICD-10-CM | POA: Diagnosis not present

## 2011-02-06 DIAGNOSIS — I6350 Cerebral infarction due to unspecified occlusion or stenosis of unspecified cerebral artery: Secondary | ICD-10-CM | POA: Diagnosis not present

## 2011-02-06 DIAGNOSIS — R29898 Other symptoms and signs involving the musculoskeletal system: Secondary | ICD-10-CM | POA: Insufficient documentation

## 2011-02-06 DIAGNOSIS — C8339 Primary central nervous system lymphoma: Secondary | ICD-10-CM | POA: Insufficient documentation

## 2011-02-06 DIAGNOSIS — Z87898 Personal history of other specified conditions: Secondary | ICD-10-CM | POA: Diagnosis not present

## 2011-02-06 DIAGNOSIS — Z8673 Personal history of transient ischemic attack (TIA), and cerebral infarction without residual deficits: Secondary | ICD-10-CM | POA: Diagnosis not present

## 2011-02-07 ENCOUNTER — Other Ambulatory Visit: Payer: Medicare Other | Admitting: *Deleted

## 2011-02-08 DIAGNOSIS — Z5189 Encounter for other specified aftercare: Secondary | ICD-10-CM | POA: Diagnosis not present

## 2011-02-08 DIAGNOSIS — R269 Unspecified abnormalities of gait and mobility: Secondary | ICD-10-CM | POA: Diagnosis not present

## 2011-02-08 DIAGNOSIS — I129 Hypertensive chronic kidney disease with stage 1 through stage 4 chronic kidney disease, or unspecified chronic kidney disease: Secondary | ICD-10-CM | POA: Diagnosis not present

## 2011-02-08 DIAGNOSIS — N189 Chronic kidney disease, unspecified: Secondary | ICD-10-CM | POA: Diagnosis not present

## 2011-02-08 DIAGNOSIS — I2589 Other forms of chronic ischemic heart disease: Secondary | ICD-10-CM | POA: Diagnosis not present

## 2011-02-08 DIAGNOSIS — C8589 Other specified types of non-Hodgkin lymphoma, extranodal and solid organ sites: Secondary | ICD-10-CM | POA: Diagnosis not present

## 2011-02-10 ENCOUNTER — Other Ambulatory Visit: Payer: Self-pay | Admitting: *Deleted

## 2011-02-10 ENCOUNTER — Ambulatory Visit (HOSPITAL_BASED_OUTPATIENT_CLINIC_OR_DEPARTMENT_OTHER): Payer: Medicare Other | Admitting: Oncology

## 2011-02-10 VITALS — BP 149/90 | HR 82 | Temp 96.7°F | Ht 64.0 in | Wt 133.4 lb

## 2011-02-10 DIAGNOSIS — Z23 Encounter for immunization: Secondary | ICD-10-CM

## 2011-02-10 DIAGNOSIS — C8599 Non-Hodgkin lymphoma, unspecified, extranodal and solid organ sites: Secondary | ICD-10-CM

## 2011-02-10 DIAGNOSIS — C8589 Other specified types of non-Hodgkin lymphoma, extranodal and solid organ sites: Secondary | ICD-10-CM

## 2011-02-10 MED ORDER — INFLUENZA VIRUS VACC SPLIT PF IM SUSP
0.5000 mL | Freq: Once | INTRAMUSCULAR | Status: AC
  Administered 2011-02-10: 0.5 mL via INTRAMUSCULAR
  Filled 2011-02-10: qty 0.5

## 2011-02-10 NOTE — Progress Notes (Signed)
OFFICE PROGRESS NOTE   INTERVAL HISTORY:   She returns as scheduled. She reports stable bowel and difficulty. She fell recently while getting the mail and there was no apparent injury. Her son has noted increased difficulty with memory recall. No new focal neurologic deficits. She continues to bowl once or twice a week.  Objective:  Vital signs in last 24 hours:  Blood pressure 149/90, pulse 82, temperature 96.7 F (35.9 C), temperature source Oral, height 5\' 4"  (1.626 m), weight 133 lb 6.4 oz (60.51 kg).    HEENT: Neck without mass Resp: Lungs clear bilaterally Cardio: Regular rate and rhythm with premature beats. GI: No hepatosplenomegaly Vascular: No leg edema Neuro: Alert and oriented. The motor examination appears intact in the upper and lower extremities. Finger to nose testing is normal. The gait is ataxic.    Lab Results:  Lab Results  Component Value Date   WBC 5.1 01/11/2011   HGB 13.1 01/11/2011   HCT 38.3 01/11/2011   MCV 96.7 01/11/2011   PLT 194 01/11/2011   X-rays: MRI of the brain without contrast on 02/06/2011 was compared to an MRI from 10/07/2010. Not possible to compare the previously noted broad-based dural enhancement along the left convexity. Post therapy changes at the anterior left temporal lobe with a slight increase in T2 altered signal intensity in the subcortical region. Unchanged colloid cyst. Unchanged ventricular prominence and atrophy. 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 No acute infarct.    Medications: I have reviewed the patient's current medications.  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  a. Status post surgical resection at [facility] 07/20/2010 with the pathology confirming a diffuse large B-cell lymphoma.  b. Initiation of weekly rituximab therapy 08/31/2010, status post week number 4 on 09/23/2010. c. Restaging  MRI of the brain 10/07/2010 without evidence of disease progression. d. Initiation of every 2 month "maintenance" rituximab on 11/16/2010. Last treated on 01/11/2011. e. Restaging MRI of the brain 02/06/2011 unchanged aside from a slight increase in altered T2 signal in the subcortical region at the anterior left temporal lobe the 2. Gait ataxia and memory deficit, question related to the central nervous system lymphoma. She appears stable today. 3. Hypertension. 4. Nonischemic cardiomyopathy. 5. Status post aortic valve replacement. 6. Transient ischemic attack in 2010. 7. Chronic renal insufficiency.   Disposition:  She appears stable. The restaging MRI of the brain reveals no clear evidence of disease progression. The plan is to continue maintenance rituximab. She is here today with her son. I recommended she not drive. She has significant ataxia and recently fell. Her son will check into a home physical therapy/occupational therapy recommendation for a walker or cane. He is also planning to arrange for help in the home.  She will be scheduled for a return office visit within the next few months. We will schedule a restaging MRI of the brain at a 3 to 62-month interval.   Lucile Shutters, MD  02/10/2011  12:08 PM

## 2011-02-13 ENCOUNTER — Telehealth: Payer: Self-pay | Admitting: Oncology

## 2011-02-13 NOTE — Telephone Encounter (Signed)
Per 1/18 pof cx'd march appts and scheduled lb/tx for 2/13 and lb/fu/tx 4/10. Not able to reach pt re appts. S/w pt's dtr re change and new d/t's. Per carol she will contact her mom. Schedule mailed.

## 2011-02-14 DIAGNOSIS — C8589 Other specified types of non-Hodgkin lymphoma, extranodal and solid organ sites: Secondary | ICD-10-CM | POA: Diagnosis not present

## 2011-02-14 DIAGNOSIS — I129 Hypertensive chronic kidney disease with stage 1 through stage 4 chronic kidney disease, or unspecified chronic kidney disease: Secondary | ICD-10-CM | POA: Diagnosis not present

## 2011-02-14 DIAGNOSIS — N189 Chronic kidney disease, unspecified: Secondary | ICD-10-CM | POA: Diagnosis not present

## 2011-02-14 DIAGNOSIS — Z5189 Encounter for other specified aftercare: Secondary | ICD-10-CM | POA: Diagnosis not present

## 2011-02-14 DIAGNOSIS — R269 Unspecified abnormalities of gait and mobility: Secondary | ICD-10-CM | POA: Diagnosis not present

## 2011-02-14 DIAGNOSIS — I2589 Other forms of chronic ischemic heart disease: Secondary | ICD-10-CM | POA: Diagnosis not present

## 2011-02-23 ENCOUNTER — Telehealth: Payer: Self-pay | Admitting: Oncology

## 2011-02-23 NOTE — Telephone Encounter (Signed)
Pt's dtr carol trotter called and changed time of 2/13 appt and moved 4/10 appts to 4/18 due to 4/10 is pt's birthday week and she will be out of town.

## 2011-02-24 ENCOUNTER — Telehealth: Payer: Self-pay | Admitting: *Deleted

## 2011-02-24 NOTE — Telephone Encounter (Signed)
Wanted to clarify that her Rituxan is every 2 months and she will have labs with each tx? Also asking when MD plans next MRI? Made her aware that tx is every 2 months with CBC prior. He will schedule MRI after her visit in April.

## 2011-03-02 ENCOUNTER — Other Ambulatory Visit: Payer: Self-pay | Admitting: *Deleted

## 2011-03-02 MED ORDER — METOPROLOL SUCCINATE ER 50 MG PO TB24: 50.0000 mg | ORAL_TABLET | Freq: Every day | ORAL | Status: DC

## 2011-03-07 ENCOUNTER — Other Ambulatory Visit: Payer: Self-pay | Admitting: Oncology

## 2011-03-08 ENCOUNTER — Ambulatory Visit (HOSPITAL_BASED_OUTPATIENT_CLINIC_OR_DEPARTMENT_OTHER): Payer: Medicare Other

## 2011-03-08 ENCOUNTER — Other Ambulatory Visit (HOSPITAL_BASED_OUTPATIENT_CLINIC_OR_DEPARTMENT_OTHER): Payer: Medicare Other | Admitting: Lab

## 2011-03-08 VITALS — BP 143/89 | HR 75 | Temp 96.6°F

## 2011-03-08 DIAGNOSIS — Z5112 Encounter for antineoplastic immunotherapy: Secondary | ICD-10-CM

## 2011-03-08 DIAGNOSIS — C8581 Other specified types of non-Hodgkin lymphoma, lymph nodes of head, face, and neck: Secondary | ICD-10-CM

## 2011-03-08 DIAGNOSIS — C8589 Other specified types of non-Hodgkin lymphoma, extranodal and solid organ sites: Secondary | ICD-10-CM

## 2011-03-08 DIAGNOSIS — C8599 Non-Hodgkin lymphoma, unspecified, extranodal and solid organ sites: Secondary | ICD-10-CM

## 2011-03-08 LAB — CBC WITH DIFFERENTIAL/PLATELET
Basophils Absolute: 0 10*3/uL (ref 0.0–0.1)
EOS%: 6.4 % (ref 0.0–7.0)
HCT: 40 % (ref 34.8–46.6)
HGB: 14.1 g/dL (ref 11.6–15.9)
MCH: 33.5 pg (ref 25.1–34.0)
MCV: 95 fL (ref 79.5–101.0)
MONO%: 13.5 % (ref 0.0–14.0)
NEUT%: 66.5 % (ref 38.4–76.8)
Platelets: 198 10*3/uL (ref 145–400)

## 2011-03-08 MED ORDER — SODIUM CHLORIDE 0.9 % IV SOLN
Freq: Once | INTRAVENOUS | Status: AC
  Administered 2011-03-08: 12:00:00 via INTRAVENOUS

## 2011-03-08 MED ORDER — SODIUM CHLORIDE 0.9 % IV SOLN: 375.0000 mg/m2 | Freq: Once | INTRAVENOUS | Status: DC

## 2011-03-08 MED ORDER — ACETAMINOPHEN 325 MG PO TABS
650.0000 mg | ORAL_TABLET | Freq: Once | ORAL | Status: AC
  Administered 2011-03-08: 650 mg via ORAL

## 2011-03-08 MED ORDER — SODIUM CHLORIDE 0.9 % IV SOLN
375.0000 mg/m2 | Freq: Once | INTRAVENOUS | Status: AC
  Administered 2011-03-08: 600 mg via INTRAVENOUS
  Filled 2011-03-08: qty 60

## 2011-03-08 MED ORDER — DIPHENHYDRAMINE HCL 25 MG PO CAPS
50.0000 mg | ORAL_CAPSULE | Freq: Once | ORAL | Status: AC
  Administered 2011-03-08: 50 mg via ORAL

## 2011-03-08 NOTE — Patient Instructions (Signed)
[facility] Discharge Instructions for Patients Receiving Chemotherapy  Today you received the following chemotherapy agents Rituxan  To help prevent nausea and vomiting after your treatment, we encourage you to take your nausea medication as directed by MD.   If you develop nausea and vomiting that is not controlled by your nausea medication, call the clinic. If it is after clinic hours your family physician or the after hours number for the clinic or go to the Emergency Department.   BELOW ARE SYMPTOMS THAT SHOULD BE REPORTED IMMEDIATELY:  *FEVER GREATER THAN 100.5 F  *CHILLS WITH OR WITHOUT FEVER  NAUSEA AND VOMITING THAT IS NOT CONTROLLED WITH YOUR NAUSEA MEDICATION  *UNUSUAL SHORTNESS OF BREATH  *UNUSUAL BRUISING OR BLEEDING  TENDERNESS IN MOUTH AND THROAT WITH OR WITHOUT PRESENCE OF ULCERS  *URINARY PROBLEMS  *BOWEL PROBLEMS  UNUSUAL RASH Items with * indicate a potential emergency and should be followed up as soon as possible.  One of the nurses will contact you 24 hours after your first treatment. Please let the nurse know about any problems that you may have experienced. Feel free to call the clinic you have any questions or concerns. The clinic phone number is [phone].   I have been informed and understand all the instructions given to me. I know to contact the clinic, my physician, or go to the Emergency Department if any problems should occur. I do not have any questions at this time, but understand that I may call the clinic during office hours   should I have any questions or need assistance in obtaining follow up care.    __________________________________________  _____________  __________ Signature of Patient or Authorized Representative            Date                   Time    __________________________________________ Nurse's Signature

## 2011-03-08 NOTE — Progress Notes (Signed)
Pt on rapid rituxan tolerates treatment without difficulty

## 2011-03-20 ENCOUNTER — Telehealth: Payer: Self-pay | Admitting: Internal Medicine

## 2011-03-20 ENCOUNTER — Encounter: Payer: Self-pay | Admitting: Cardiology

## 2011-03-20 ENCOUNTER — Ambulatory Visit (INDEPENDENT_AMBULATORY_CARE_PROVIDER_SITE_OTHER): Payer: Medicare Other | Admitting: Cardiology

## 2011-03-20 VITALS — BP 140/90 | HR 70 | Ht 65.0 in | Wt 129.0 lb

## 2011-03-20 DIAGNOSIS — R5383 Other fatigue: Secondary | ICD-10-CM | POA: Diagnosis not present

## 2011-03-20 DIAGNOSIS — I509 Heart failure, unspecified: Secondary | ICD-10-CM

## 2011-03-20 DIAGNOSIS — R079 Chest pain, unspecified: Secondary | ICD-10-CM | POA: Diagnosis not present

## 2011-03-20 DIAGNOSIS — R5381 Other malaise: Secondary | ICD-10-CM

## 2011-03-20 DIAGNOSIS — I359 Nonrheumatic aortic valve disorder, unspecified: Secondary | ICD-10-CM

## 2011-03-20 DIAGNOSIS — I5032 Chronic diastolic (congestive) heart failure: Secondary | ICD-10-CM | POA: Diagnosis not present

## 2011-03-20 DIAGNOSIS — I10 Essential (primary) hypertension: Secondary | ICD-10-CM

## 2011-03-20 NOTE — Assessment & Plan Note (Signed)
This chest pain was atypical and there is no objective evidence of ischemia.  At this point I do not think further cardiovascular testing is suggested. I did review the cath results from 2007. If however she has recurrent discomfort I might suggest a Lexiscan Myoview.

## 2011-03-20 NOTE — Telephone Encounter (Signed)
New msg Pt said she woke up at 2am and said she had pain in her chest but it went away, no sob Please call

## 2011-03-20 NOTE — Assessment & Plan Note (Signed)
Her blood pressure is upper limits of normal. She will keep an eye on this and I will make no change to her regimen.

## 2011-03-20 NOTE — Telephone Encounter (Signed)
F/U   Patient friend Renita Papa, calling back to update status saying patient needs to be seen today.  Please return call to patient at hm# ,  Darel Hong can also be reached at [phone], as she will give patient at ride.

## 2011-03-20 NOTE — Progress Notes (Signed)
HPI The patient presents for evaluation of shortness of breath. She has a history of an ASD repair in 2007.  She had a cardiomyopathy at that time with an EF of 25%. However, followup echoes including one done in March of last year demonstrated a well preserved ejection fraction. She has a stable bioprosthetic valve replacement. Earlier this year she was found to have a CNS lymphoma and had left temporal lobe resection and chemotherapy in [facility].   At the last visit she followed up for dyspnea.  She had not been taking her diuretic routinely particularly when she was out of the house. We reviewed salt and diet restrictions.  She was added to my schedule today because of an episode of chest pain that woke her from her sleep last night.  She reports that it was lower sternal and upper epigastric pain. She doesn't recall having pain like this before. She didn't have any jaw discomfort or arm discomfort. She thinks it lasted for a few minutes. He went away spontaneously. She didn't report any shortness of breath, PND or orthopnea. She did not have any diaphoresis. She may have a little bit of this again this morning. However, she's otherwise not describing this.   No Known Allergies  Current Outpatient Prescriptions  Medication Sig Dispense Refill  . folic acid (FOLVITE) 1 MG tablet TAKE 1 TABLET ONCE DAILY.  30 tablet  5  . furosemide (LASIX) 20 MG tablet Take 1 tablet (20 mg total) by mouth daily.      . metoprolol succinate (TOPROL-XL) 50 MG 24 hr tablet Take 1 tablet (50 mg total) by mouth daily.  30 tablet  6  . ramipril (ALTACE) 2.5 MG capsule Take 1 capsule (2.5 mg total) by mouth daily.  30 capsule  6    Past Medical History  Diagnosis Date  . Nonischemic cardiomyopathy 2007 / Feb 2009    Miminial non-obs CAD cath (2007); EF previously 25%;  55% by echo 2012  . Aortic insufficiency October 2007    Severe with ascending thoracic aneurysm; s/p pericardial tissue AVR and Heamsheild  graft in October 2007; c/b pericardial effusion requiring window  . Hyperlipidemia   . Hypertension   . GERD (gastroesophageal reflux disease)   . History of cholelithiasis     Gallbladder dysfunction  . History of non anemic vitamin B12 deficiency   . Allergic rhinitis   . TIA (transient ischemic attack) October 2010  . Brain cancer   . CNS lymphoma     Past Surgical History  Procedure Date  . Tonsillectomy   . Aortic valve replacement 2007  . Bilateral vein stripping   . Left temporal tumor resected    ROS: Unbalanced, fatigue.  Otherwise as stated in the HPI and negative for all other systems.  PHYSICAL EXAM BP 140/90  Pulse 70  Ht 5\' 5"  (1.651 m)  Wt 58.514 kg (129 lb)  BMI 21.47 kg/m2 GENERAL:  Well appearing HEENT:  Pupils equal round and reactive, fundi not visualized, oral mucosa unremarkable NECK:  No jugular venous distention, waveform within normal limits, carotid upstroke brisk and symmetric, no bruits, no thyromegaly LYMPHATICS:  No cervical, inguinal adenopathy LUNGS:  Clear to auscultation bilaterally BACK:  No CVA tenderness CHEST:  Well healed sternotomy scar. HEART:  PMI not displaced or sustained,S1 and S2 within normal limits, no S3, no S4, no clicks, no rubs, no murmurs ABD:  Flat, positive bowel sounds normal in frequency in pitch, no bruits, no rebound,  no guarding, no midline pulsatile mass, no hepatomegaly, no splenomegaly EXT:  2 plus pulses throughout, no edema, no cyanosis no clubbing SKIN:  No rashes no nodules NEURO:  Cranial nerves II through XII grossly intact, motor grossly intact throughout PSYCH:  Cognitively intact, oriented to person place and time  EKG:  Sinus rhythm, rate 70, axis within normal limits, intervals within normal limits, T wave inversion in lead 1 and aVL unchanged from previous. 03/20/2011  ASSESSMENT AND PLAN

## 2011-03-20 NOTE — Telephone Encounter (Signed)
Spoke with pt who states she had pain around her heart this am about 1 am that woke her from her sleep.  She has never had pain like this before and would like to be seen.  She denies any radiation, n/v and or SOB.   appt scheduled for today at 11:30.  She will report to the ED if she has furhter pain prior to then.

## 2011-03-20 NOTE — Assessment & Plan Note (Signed)
She seems to be euvolemic.  At this point, no change in therapy is indicated.  We have reviewed salt and fluid restrictions.  No further cardiovascular testing is indicated.   

## 2011-03-20 NOTE — Assessment & Plan Note (Signed)
Her valve replacement was stable at the last echo. No change in therapy or further imaging is indicated at this point.

## 2011-03-20 NOTE — Patient Instructions (Addendum)
Please have blood work today.  Please continue current medications as listed.  Please call if you develop further chest pain.  Follow up in 2 weeks with Dr Antoine Poche (3/18)

## 2011-03-20 NOTE — Assessment & Plan Note (Signed)
I will check a TSH.  I reviewed other labs recently and she is not anemic.

## 2011-04-05 ENCOUNTER — Ambulatory Visit: Payer: Medicare Other

## 2011-04-05 ENCOUNTER — Ambulatory Visit: Payer: Medicare Other | Admitting: Oncology

## 2011-04-05 ENCOUNTER — Other Ambulatory Visit: Payer: Medicare Other

## 2011-04-06 IMAGING — CR DG CHEST 2V
2 series · 2 of 2 positions shown · non-contrast
Comparison: Chest 06/30/2008.

CLINICAL DATA: Shortness of breath.

CHEST - 2 VIEW

[view not recorded (1 of 2)]
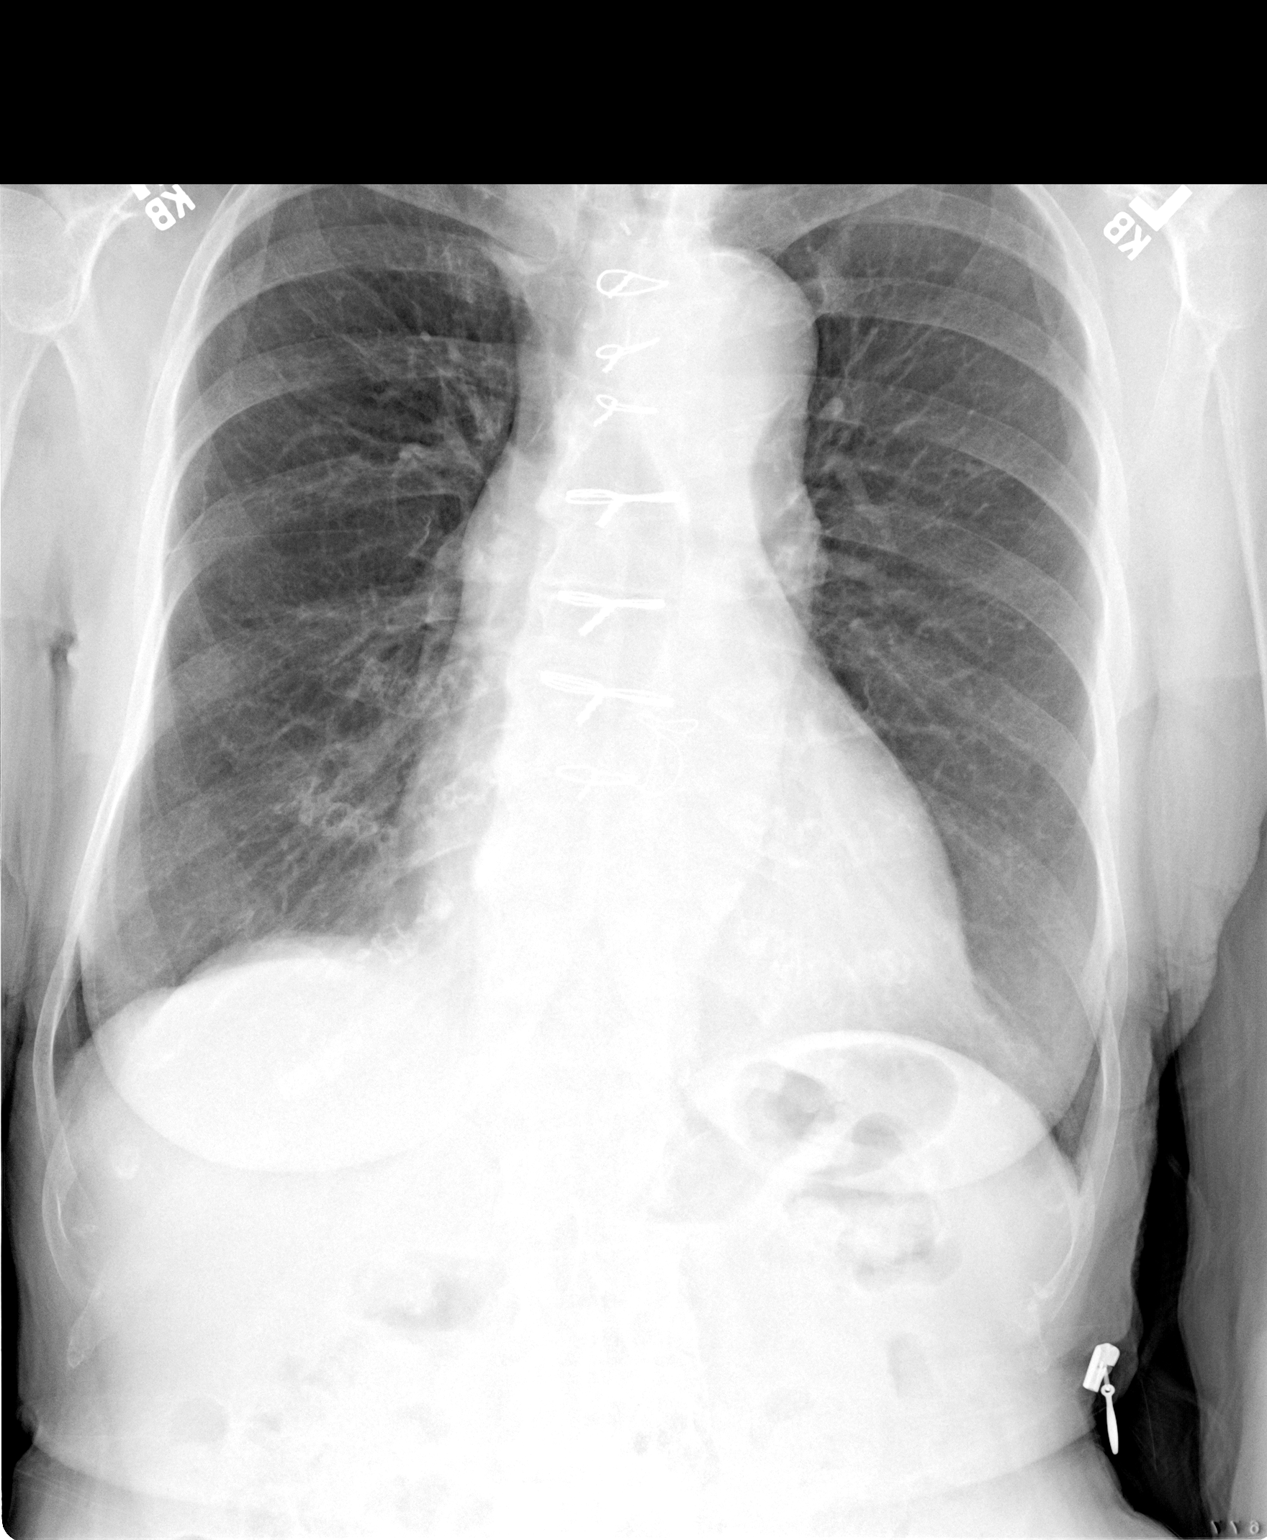

[view not recorded (2 of 2)]
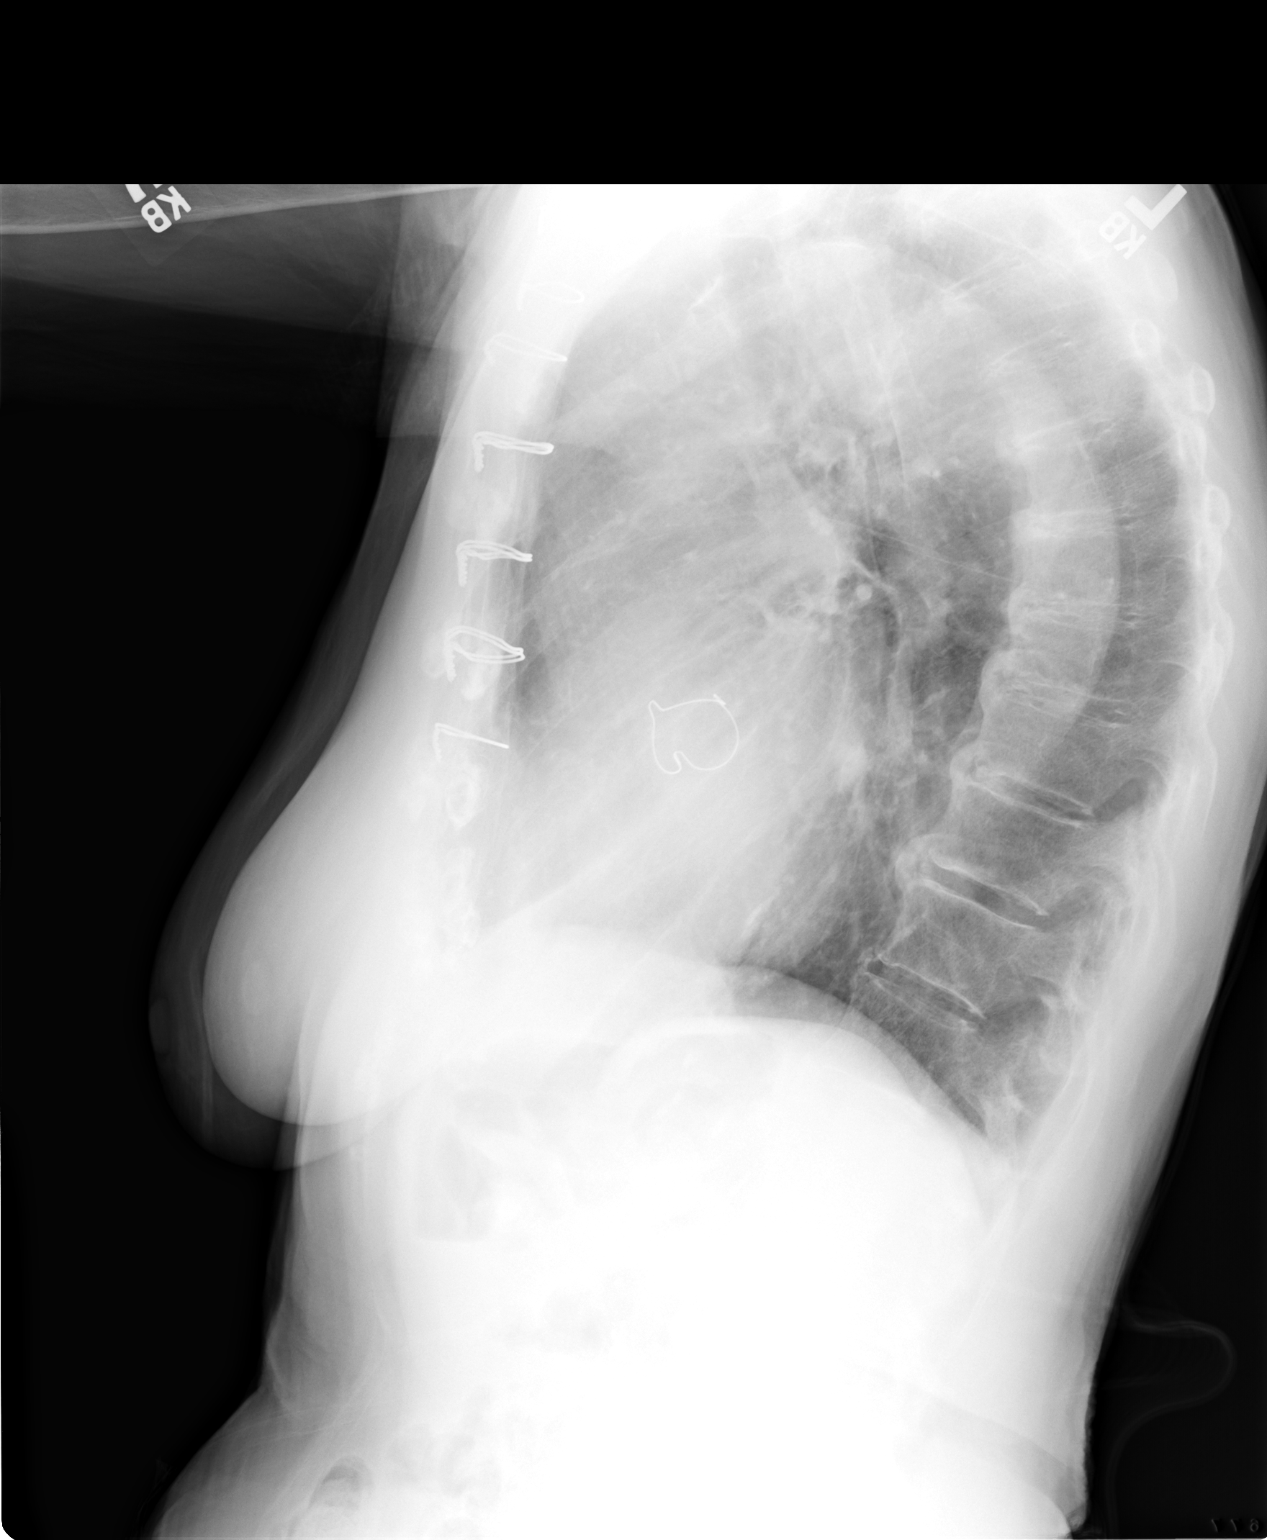

[2 of 2 positions shown; findings below may reference images not displayed]

FINDINGS: The lungs are clear.  There is mild cardiomegaly.  The
patient is status post aortic valve replacement.  No pleural
effusion.
IMPRESSION: No acute disease.  Stable compared prior exam.

## 2011-04-10 ENCOUNTER — Ambulatory Visit (INDEPENDENT_AMBULATORY_CARE_PROVIDER_SITE_OTHER): Payer: Medicare Other | Admitting: Cardiology

## 2011-04-10 ENCOUNTER — Encounter: Payer: Self-pay | Admitting: Cardiology

## 2011-04-10 VITALS — BP 122/74 | HR 75 | Ht 64.0 in | Wt 129.0 lb

## 2011-04-10 DIAGNOSIS — Z954 Presence of other heart-valve replacement: Secondary | ICD-10-CM

## 2011-04-10 DIAGNOSIS — R0602 Shortness of breath: Secondary | ICD-10-CM

## 2011-04-10 DIAGNOSIS — I359 Nonrheumatic aortic valve disorder, unspecified: Secondary | ICD-10-CM

## 2011-04-10 DIAGNOSIS — I10 Essential (primary) hypertension: Secondary | ICD-10-CM | POA: Diagnosis not present

## 2011-04-10 DIAGNOSIS — Z952 Presence of prosthetic heart valve: Secondary | ICD-10-CM

## 2011-04-10 NOTE — Assessment & Plan Note (Signed)
This was only mild.  No change in therapy is indicated.

## 2011-04-10 NOTE — Assessment & Plan Note (Signed)
Dyspnea and fatigue are her 2 biggest complaints. At this point I don't see an overt cardiac etiology although it may have something to do with her dynamic gradient. However, without edema with her only mildly elevated BNP I would not suggest further change in therapy. I will have her see Dr. Debby Bud prior to a possible trip out of town to further discuss these symptoms.

## 2011-04-10 NOTE — Patient Instructions (Signed)
Please follow up with your PCP (Dr Debby Bud)  The current medical regimen is effective;  continue present plan and medications.  Follow up with Dr Antoine Poche in 3 months

## 2011-04-10 NOTE — Assessment & Plan Note (Signed)
The blood pressure is at target. No change in medications is indicated. We will continue with therapeutic lifestyle changes (TLC).  

## 2011-04-10 NOTE — Assessment & Plan Note (Signed)
This was stable on follow up echo.  No change in therapy is indicated.

## 2011-04-10 NOTE — Progress Notes (Signed)
HPI The patient presents for evaluation of shortness of breath. She has a history of an ASD repair in 2007.  She had a cardiomyopathy at that time with an EF of 25%. However, I reviewed an echocardiogram recently after her last visit. She has a stable aortic valve replacement. The EF was well preserved.  There is some evidence of LV outflow tract dynamic obstruction.  However, this was not severe  And a recent BNP was only mildly elevated.   Today her biggest complaint remains fatigue and breathlessness particularly after she's been sitting and gets up to move.  She denies chest pain at this visit although she was complaining of that previously. She's not having any palpitations, presyncope or syncope. She's had no PND or orthopnea. She was breathless walking to the office today. However, we checked her pulse ox and it remained in the mid 90s at rest and with the with ambulation without significant change her heart. She was breathless however.    No Known Allergies  Current Outpatient Prescriptions  Medication Sig Dispense Refill  . cyanocobalamin 2000 MCG tablet Take 2,000 mcg by mouth 2 (two) times daily.      . folic acid (FOLVITE) 1 MG tablet TAKE 1 TABLET ONCE DAILY.  30 tablet  5  . furosemide (LASIX) 20 MG tablet Take 1 tablet (20 mg total) by mouth daily.      . Magnesium 250 MG TABS Take 250 mg by mouth 2 (two) times daily.      . metoprolol succinate (TOPROL-XL) 50 MG 24 hr tablet Take 1 tablet (50 mg total) by mouth daily.  30 tablet  6  . ramipril (ALTACE) 2.5 MG capsule Take 1 capsule (2.5 mg total) by mouth daily.  30 capsule  6    Past Medical History  Diagnosis Date  . Nonischemic cardiomyopathy 2007 / Feb 2009    Miminial non-obs CAD cath (2007); EF previously 25%;  55% by echo 2012  . Aortic insufficiency October 2007    Severe with ascending thoracic aneurysm; s/p pericardial tissue AVR and Heamsheild graft in October 2007; c/b pericardial effusion requiring window  .  Hyperlipidemia   . Hypertension   . GERD (gastroesophageal reflux disease)   . History of cholelithiasis     Gallbladder dysfunction  . History of non anemic vitamin B12 deficiency   . Allergic rhinitis   . TIA (transient ischemic attack) October 2010  . Brain cancer   . CNS lymphoma     Past Surgical History  Procedure Date  . Tonsillectomy   . Aortic valve replacement 2007  . Bilateral vein stripping   . Left temporal tumor resected    ROS: Unbalanced, fatigue.  Otherwise as stated in the HPI and negative for all other systems.  PHYSICAL EXAM BP 122/74  Pulse 75  Ht 5\' 4"  (1.626 m)  Wt 129 lb (58.514 kg)  BMI 22.14 kg/m2 GENERAL:  Well appearing HEENT:  Pupils equal round and reactive, fundi not visualized, oral mucosa unremarkable NECK:  No jugular venous distention, waveform within normal limits, carotid upstroke brisk and symmetric, no bruits, no thyromegaly LYMPHATICS:  No cervical, inguinal adenopathy LUNGS:  Clear to auscultation bilaterally BACK:  No CVA tenderness CHEST:  Well healed sternotomy scar. HEART:  PMI not displaced or sustained,S1 and S2 within normal limits, no S3, no S4, no clicks, no rubs, no murmurs ABD:  Flat, positive bowel sounds normal in frequency in pitch, no bruits, no rebound, no guarding, no  midline pulsatile mass, no hepatomegaly, no splenomegaly EXT:  2 plus pulses throughout, no edema, no cyanosis no clubbing SKIN:  No rashes no nodules NEURO:  Cranial nerves II through XII grossly intact, motor grossly intact throughout PSYCH:  Cognitively intact, oriented to person place and time  EKG:  Sinus rhythm, rate 70, axis within normal limits, intervals within normal limits, T wave inversion in lead 1 and aVL unchanged from previous. 04/10/2011   ASSESSMENT AND PLAN

## 2011-04-18 ENCOUNTER — Other Ambulatory Visit: Payer: Self-pay | Admitting: *Deleted

## 2011-04-18 MED ORDER — RAMIPRIL 2.5 MG PO CAPS: 2.5000 mg | ORAL_CAPSULE | Freq: Every day | ORAL | Status: DC

## 2011-04-25 ENCOUNTER — Telehealth: Payer: Self-pay | Admitting: Cardiology

## 2011-04-25 NOTE — Telephone Encounter (Signed)
Pt does not have anyone listed on a Designated Party Release form for [place] to be able to discuss her care.  Will mail the pt a form to sign and return so that we may discuss her care with her daughter and who ever else she chooses.  Daughter aware

## 2011-04-25 NOTE — Telephone Encounter (Signed)
Please return call to patient daughter Magda Kiel  [phone]  Daughter called to discuss patient medical care, she can be reached at [phone]

## 2011-05-01 ENCOUNTER — Encounter: Payer: Self-pay | Admitting: Endocrinology

## 2011-05-01 ENCOUNTER — Ambulatory Visit (INDEPENDENT_AMBULATORY_CARE_PROVIDER_SITE_OTHER): Payer: Medicare Other | Admitting: Endocrinology

## 2011-05-01 ENCOUNTER — Ambulatory Visit (INDEPENDENT_AMBULATORY_CARE_PROVIDER_SITE_OTHER)
Admission: RE | Admit: 2011-05-01 | Discharge: 2011-05-01 | Disposition: A | Discharge status: Home / Self Care | Payer: Medicare Other | Source: Ambulatory Visit | Attending: Endocrinology | Admitting: Endocrinology

## 2011-05-01 VITALS — BP 130/80 | HR 109 | Temp 97.3°F | Wt 130.8 lb

## 2011-05-01 DIAGNOSIS — I10 Essential (primary) hypertension: Secondary | ICD-10-CM | POA: Diagnosis not present

## 2011-05-01 DIAGNOSIS — R05 Cough: Secondary | ICD-10-CM | POA: Diagnosis not present

## 2011-05-01 DIAGNOSIS — R5383 Other fatigue: Secondary | ICD-10-CM | POA: Diagnosis not present

## 2011-05-01 DIAGNOSIS — J209 Acute bronchitis, unspecified: Secondary | ICD-10-CM | POA: Diagnosis not present

## 2011-05-01 MED ORDER — CEFUROXIME AXETIL 250 MG PO TABS: 250.0000 mg | ORAL_TABLET | Freq: Two times a day (BID) | ORAL | Status: DC

## 2011-05-01 NOTE — Progress Notes (Signed)
Subjective:    Patient ID: [name], female    DOB: [birth year], 78 y.o.   MRN: [id number]  HPI Pt states 3 days of prod-quality cough in the chest, and slight assoc wheezing.  She also has sore throat. Past Medical History  Diagnosis Date  . Nonischemic cardiomyopathy 2007 / Feb 2009    Miminial non-obs CAD cath (2007); EF previously 25%;  55% by echo 2012  . Aortic insufficiency October 2007    Severe with ascending thoracic aneurysm; s/p pericardial tissue AVR and Heamsheild graft in October 2007; c/b pericardial effusion requiring window  . Hyperlipidemia   . Hypertension   . GERD (gastroesophageal reflux disease)   . History of cholelithiasis     Gallbladder dysfunction  . History of non anemic vitamin B12 deficiency   . Allergic rhinitis   . TIA (transient ischemic attack) October 2010  . Brain cancer   . CNS lymphoma     Past Surgical History  Procedure Date  . Tonsillectomy   . Aortic valve replacement 2007  . Bilateral vein stripping   . Left temporal tumor resected     History   Social History  . Marital Status: Widowed    Spouse Name: N/A    Number of Children: N/A  . Years of Education: N/A   Occupational History  . Not on file.   Social History Main Topics  . Smoking status: Never Smoker   . Smokeless tobacco: Never Used  . Alcohol Use: No  . Drug Use: No  . Sexually Active: Not on file   Other Topics Concern  . Not on file   Social History Narrative   HSGMarried @ [phone], 50 years/ widowed 2007[place] was a housewifeNo h/o abuseEnd-of-Life: No CPR; DNI; no prolonged tube feeding.  Provided signed "Out of Facility Order" and completed and signed MOST form.  These should accompany her to hospital, etc.  She should share her wished with the family (04/01/10)    Current Outpatient Prescriptions on File Prior to Visit  Medication Sig Dispense Refill  . cyanocobalamin 2000 MCG tablet Take 2,000 mcg by mouth 2 (two) times  daily.      . folic acid (FOLVITE) 1 MG tablet TAKE 1 TABLET ONCE DAILY.  30 tablet  5  . furosemide (LASIX) 20 MG tablet Take 1 tablet (20 mg total) by mouth daily.      . Magnesium 250 MG TABS Take 250 mg by mouth 2 (two) times daily.      . metoprolol succinate (TOPROL-XL) 50 MG 24 hr tablet Take 1 tablet (50 mg total) by mouth daily.  30 tablet  6  . ramipril (ALTACE) 2.5 MG capsule Take 1 capsule (2.5 mg total) by mouth daily.  30 capsule  11    No Known Allergies  Family History  Problem Relation Age of Onset  . Stroke Father   . Arthritis Other   . Coronary artery disease Other     Female 1st degree relative <50  . Diabetes Other   . Cerebral aneurysm Son     BP 130/80  Pulse 109  Temp(Src) 97.3 F (36.3 C) (Oral)  Wt 130 lb 12.8 oz (59.33 kg)  SpO2 96%  Review of Systems Denies fever and sob.    Objective:   Physical Exam VITAL SIGNS:  See vs page GENERAL: no distress head: no deformity eyes: no periorbital swelling, no proptosis external nose and ears are normal mouth: no lesion seen NECK:  There is no palpable thyroid enlargement.  No thyroid nodule is palpable.  No palpable lymphadenopathy at the anterior neck. LUNGS:  Clear to auscultation   (cxr: nad)    Assessment & Plan:  Acute bronchitis, new

## 2011-05-01 NOTE — Patient Instructions (Addendum)
here is a sample of "dulera-100."  take 1 puff 2x a day.  rinse mouth after using. i have sent a prescription to your pharmacy, for an antibiotic pill.   A chest-x-ray is requested for you today.  You will receive a letter with results.  Loratadine (non-prescription) will help your runny nose.

## 2011-05-02 ENCOUNTER — Telehealth: Payer: Self-pay

## 2011-05-02 NOTE — Telephone Encounter (Signed)
[facility] Triage Call Report Triage Record Num: [id number] Operator: Ether Griffins Patient Name: [name] Call Date & Time: 05/01/2011 5:27:36PM Patient Phone: [phone] PCP: Romero Belling Patient Gender: Female PCP Fax : [phone] Patient DOB: [birth year] [facility] Name: [facility] Reason for Call: Caller: Tresa Endo; PCP: Romero Belling; CB#: [phone]; Calling about script for Ceftin--pharmacy has in their records that pt is allergic to PCN and Cephalosporins.Son stated that he did not know of any allergies for her.Chart was pulled up in [facility] and states NO KNOWN ALLERGIES for pt. Information given to [facility] pharmacist at [facility]. Protocol(s) Used: Office Note Recommended Outcome per Protocol: Information Noted and Sent to Office Reason for Outcome: Caller information to office Care Advice: ~ 05/01/2011 5:36:46PM Page 1 of 1 [email or web address]

## 2011-05-03 ENCOUNTER — Other Ambulatory Visit: Payer: Medicare Other | Admitting: Lab

## 2011-05-03 ENCOUNTER — Ambulatory Visit: Payer: Medicare Other | Admitting: Oncology

## 2011-05-03 ENCOUNTER — Ambulatory Visit: Payer: Medicare Other

## 2011-05-08 ENCOUNTER — Other Ambulatory Visit: Payer: Self-pay | Admitting: Endocrinology

## 2011-05-09 ENCOUNTER — Other Ambulatory Visit: Payer: Self-pay | Admitting: Endocrinology

## 2011-05-10 ENCOUNTER — Other Ambulatory Visit: Payer: Self-pay | Admitting: Oncology

## 2011-05-11 ENCOUNTER — Ambulatory Visit: Payer: Medicare Other | Admitting: Oncology

## 2011-05-11 ENCOUNTER — Other Ambulatory Visit: Payer: Self-pay | Admitting: *Deleted

## 2011-05-11 ENCOUNTER — Ambulatory Visit (HOSPITAL_BASED_OUTPATIENT_CLINIC_OR_DEPARTMENT_OTHER): Payer: Medicare Other

## 2011-05-11 ENCOUNTER — Telehealth: Payer: Self-pay | Admitting: Oncology

## 2011-05-11 ENCOUNTER — Other Ambulatory Visit (HOSPITAL_BASED_OUTPATIENT_CLINIC_OR_DEPARTMENT_OTHER): Payer: Medicare Other

## 2011-05-11 VITALS — BP 149/76 | HR 72 | Temp 97.0°F | Ht 64.0 in | Wt 131.3 lb

## 2011-05-11 VITALS — BP 147/85 | HR 65 | Temp 97.7°F

## 2011-05-11 DIAGNOSIS — Z5112 Encounter for antineoplastic immunotherapy: Secondary | ICD-10-CM | POA: Diagnosis not present

## 2011-05-11 DIAGNOSIS — C8290 Follicular lymphoma, unspecified, unspecified site: Secondary | ICD-10-CM

## 2011-05-11 DIAGNOSIS — C96A Histiocytic sarcoma: Secondary | ICD-10-CM

## 2011-05-11 DIAGNOSIS — C8589 Other specified types of non-Hodgkin lymphoma, extranodal and solid organ sites: Secondary | ICD-10-CM

## 2011-05-11 DIAGNOSIS — C8599 Non-Hodgkin lymphoma, unspecified, extranodal and solid organ sites: Secondary | ICD-10-CM

## 2011-05-11 LAB — CBC WITH DIFFERENTIAL/PLATELET
Basophils Absolute: 0.1 10*3/uL (ref 0.0–0.1)
Eosinophils Absolute: 0.4 10*3/uL (ref 0.0–0.5)
HCT: 36.2 % (ref 34.8–46.6)
HGB: 12.4 g/dL (ref 11.6–15.9)
LYMPH%: 19 % (ref 14.0–49.7)
MCV: 97.6 fL (ref 79.5–101.0)
MONO#: 0.6 10*3/uL (ref 0.1–0.9)
NEUT#: 2 10*3/uL (ref 1.5–6.5)
NEUT%: 51.4 % (ref 38.4–76.8)
Platelets: 252 10*3/uL (ref 145–400)
RBC: 3.71 10*6/uL (ref 3.70–5.45)
WBC: 3.8 10*3/uL — ABNORMAL LOW (ref 3.9–10.3)
nRBC: 0 % (ref 0–0)

## 2011-05-11 MED ORDER — ACETAMINOPHEN 325 MG PO TABS
650.0000 mg | ORAL_TABLET | Freq: Once | ORAL | Status: AC
  Administered 2011-05-11: 650 mg via ORAL

## 2011-05-11 MED ORDER — SODIUM CHLORIDE 0.9 % IV SOLN
Freq: Once | INTRAVENOUS | Status: AC
  Administered 2011-05-11: 13:00:00 via INTRAVENOUS

## 2011-05-11 MED ORDER — RITUXIMAB CHEMO INJECTION 10 MG/ML
375.0000 mg/m2 | Freq: Once | INTRAVENOUS | Status: AC
  Administered 2011-05-11: 600 mg via INTRAVENOUS
  Filled 2011-05-11: qty 60

## 2011-05-11 MED ORDER — DIPHENHYDRAMINE HCL 25 MG PO CAPS
50.0000 mg | ORAL_CAPSULE | Freq: Once | ORAL | Status: AC
  Administered 2011-05-11: 50 mg via ORAL

## 2011-05-11 NOTE — Telephone Encounter (Signed)
gv relative appt schedule for June including mri 6/14.

## 2011-05-11 NOTE — Progress Notes (Signed)
OFFICE PROGRESS NOTE   INTERVAL HISTORY:   She returns today with her friend. She continues to have difficulty with ataxia and memory loss. This has not changed. No new neurologic symptoms. She reports a recent cold and allergy symptoms. She was last treated with Rituxan on 03/08/2011. She tolerated the treatment well.  Objective:  Vital signs in last 24 hours:  Blood pressure 149/76, pulse 72, temperature 97 F (36.1 C), temperature source Oral, height 5\' 4"  (1.626 m), weight 131 lb 4.8 oz (59.557 kg).    HEENT: No thrush Resp: Lungs clear bilaterally Cardio: Regular rate and rhythm GI: No hepatomegaly Vascular: No leg edema Neuro: Alert and oriented, the motor examination appears grossly intact in the upper and lower extremities. The gait is unsteady      Lab Results:  Lab Results  Component Value Date   WBC 3.8* 05/11/2011   HGB 12.4 05/11/2011   HCT 36.2 05/11/2011   MCV 97.6 05/11/2011   PLT 252 05/11/2011   ANC 2.0    Medications: I have reviewed the patient's current medications.  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  a. 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  b. Initiation of weekly rituximab therapy 08/31/2010, status post week number 4 on 09/23/2010. c. Restaging MRI of the brain 10/07/2010 without evidence of disease progression. d. Initiation of every 2 month "maintenance" rituximab on 11/16/2010. Last treated on 03/08/2011. e. Restaging MRI of the brain 02/06/2011 unchanged aside from a slight increase in altered T2 signal in the subcortical region at the anterior left temporal lobe the 2. Gait ataxia and memory deficit, question related to the central nervous system lymphoma. She appears stable today. 3. Hypertension. 4. Nonischemic cardiomyopathy. 5. Status post aortic valve  replacement. 6. Transient ischemic attack in 2010. 7. Chronic renal insufficiency 8.    Disposition:  She appears unchanged. The plan is to proceed with another cycle of "maintenance" rituximab today. She will return for an office visit and rituximab in 2 months. She will be scheduled for a restaging MRI of the brain prior to the next office visit.  I cautioned her to ambulate with assistance or a walker. We suggested she not wear shoes with heels.   Thornton Papas, MD  05/11/2011  5:56 PM

## 2011-05-30 ENCOUNTER — Other Ambulatory Visit: Payer: Self-pay

## 2011-05-30 DIAGNOSIS — C8589 Other specified types of non-Hodgkin lymphoma, extranodal and solid organ sites: Secondary | ICD-10-CM

## 2011-05-30 MED ORDER — SIMVASTATIN 40 MG PO TABS: 40.0000 mg | ORAL_TABLET | Freq: Every evening | ORAL | Status: DC

## 2011-05-30 NOTE — Telephone Encounter (Signed)
..   Requested Prescriptions   Signed Prescriptions Disp Refills  . simvastatin (ZOCOR) 40 MG tablet 30 tablet 11    Sig: Take 1 tablet (40 mg total) by mouth every evening.    Authorizing Provider: Rollene Rotunda    Ordering User: Christella Hartigan, Kree Rafter Judie Petit

## 2011-06-08 ENCOUNTER — Telehealth: Payer: Self-pay | Admitting: Cardiology

## 2011-06-08 NOTE — Telephone Encounter (Signed)
Daughter would like to have pt's medical records mailed to both her and her brother.  I explained to her that I would sent the request to medical records    Magda Kiel [address] [place], [place] [address]  [facility] [address] [place], [place]  [address]

## 2011-06-08 NOTE — Telephone Encounter (Signed)
Daughter wants a copy of last office visit notes and all visit going forward.  Explained to her that I do not know how to flag the patient's chart so that a copy automatically is sent to her and her brother.  She will call if she doesn't receive one after the pts next office visit.

## 2011-06-08 NOTE — Telephone Encounter (Signed)
Please return call to carol Baron Hamper [phone], patient daughter would like to speak with you regarding patient medical care.

## 2011-06-08 NOTE — Telephone Encounter (Signed)
Pt dtr calling back again [phone] has more questions

## 2011-06-12 NOTE — Telephone Encounter (Signed)
Pt has Magazine features editor, Ok For Daughter to [employer] Mailed to Daughter Address  06/12/11/KM

## 2011-07-07 ENCOUNTER — Ambulatory Visit (HOSPITAL_COMMUNITY)
Admission: RE | Admit: 2011-07-07 | Discharge: 2011-07-07 | Disposition: A | Discharge status: Home / Self Care | Payer: Medicare Other | Source: Ambulatory Visit | Attending: Oncology | Admitting: Oncology

## 2011-07-07 DIAGNOSIS — R262 Difficulty in walking, not elsewhere classified: Secondary | ICD-10-CM | POA: Insufficient documentation

## 2011-07-07 DIAGNOSIS — Z87898 Personal history of other specified conditions: Secondary | ICD-10-CM | POA: Diagnosis not present

## 2011-07-07 DIAGNOSIS — I69998 Other sequelae following unspecified cerebrovascular disease: Secondary | ICD-10-CM | POA: Diagnosis not present

## 2011-07-07 DIAGNOSIS — R51 Headache: Secondary | ICD-10-CM | POA: Insufficient documentation

## 2011-07-07 DIAGNOSIS — R269 Unspecified abnormalities of gait and mobility: Secondary | ICD-10-CM | POA: Insufficient documentation

## 2011-07-07 DIAGNOSIS — G9389 Other specified disorders of brain: Secondary | ICD-10-CM | POA: Insufficient documentation

## 2011-07-07 DIAGNOSIS — C8589 Other specified types of non-Hodgkin lymphoma, extranodal and solid organ sites: Secondary | ICD-10-CM

## 2011-07-07 DIAGNOSIS — G319 Degenerative disease of nervous system, unspecified: Secondary | ICD-10-CM | POA: Diagnosis not present

## 2011-07-07 DIAGNOSIS — Q048 Other specified congenital malformations of brain: Secondary | ICD-10-CM | POA: Diagnosis not present

## 2011-07-07 DIAGNOSIS — Z8673 Personal history of transient ischemic attack (TIA), and cerebral infarction without residual deficits: Secondary | ICD-10-CM | POA: Diagnosis not present

## 2011-07-09 ENCOUNTER — Other Ambulatory Visit: Payer: Self-pay | Admitting: Oncology

## 2011-07-10 ENCOUNTER — Ambulatory Visit: Payer: Medicare Other | Admitting: Oncology

## 2011-07-10 DIAGNOSIS — H43819 Vitreous degeneration, unspecified eye: Secondary | ICD-10-CM | POA: Diagnosis not present

## 2011-07-10 DIAGNOSIS — H5210 Myopia, unspecified eye: Secondary | ICD-10-CM | POA: Diagnosis not present

## 2011-07-10 DIAGNOSIS — Z961 Presence of intraocular lens: Secondary | ICD-10-CM | POA: Diagnosis not present

## 2011-07-10 DIAGNOSIS — H3500 Unspecified background retinopathy: Secondary | ICD-10-CM | POA: Diagnosis not present

## 2011-07-11 ENCOUNTER — Ambulatory Visit (HOSPITAL_BASED_OUTPATIENT_CLINIC_OR_DEPARTMENT_OTHER): Payer: Medicare Other | Admitting: Oncology

## 2011-07-11 ENCOUNTER — Ambulatory Visit (HOSPITAL_BASED_OUTPATIENT_CLINIC_OR_DEPARTMENT_OTHER): Payer: Medicare Other

## 2011-07-11 ENCOUNTER — Telehealth: Payer: Self-pay

## 2011-07-11 ENCOUNTER — Telehealth: Payer: Self-pay | Admitting: Oncology

## 2011-07-11 VITALS — BP 147/78 | HR 60 | Temp 98.2°F

## 2011-07-11 VITALS — BP 154/83 | HR 73 | Temp 97.0°F | Ht 64.0 in | Wt 130.8 lb

## 2011-07-11 DIAGNOSIS — Z5112 Encounter for antineoplastic immunotherapy: Secondary | ICD-10-CM

## 2011-07-11 DIAGNOSIS — C8589 Other specified types of non-Hodgkin lymphoma, extranodal and solid organ sites: Secondary | ICD-10-CM | POA: Diagnosis not present

## 2011-07-11 DIAGNOSIS — C8599 Non-Hodgkin lymphoma, unspecified, extranodal and solid organ sites: Secondary | ICD-10-CM

## 2011-07-11 MED ORDER — DIPHENHYDRAMINE HCL 25 MG PO CAPS
50.0000 mg | ORAL_CAPSULE | Freq: Once | ORAL | Status: AC
  Administered 2011-07-11: 50 mg via ORAL

## 2011-07-11 MED ORDER — SODIUM CHLORIDE 0.9 % IV SOLN
Freq: Once | INTRAVENOUS | Status: AC
  Administered 2011-07-11: 11:00:00 via INTRAVENOUS

## 2011-07-11 MED ORDER — SODIUM CHLORIDE 0.9 % IV SOLN
375.0000 mg/m2 | Freq: Once | INTRAVENOUS | Status: AC
  Administered 2011-07-11: 600 mg via INTRAVENOUS
  Filled 2011-07-11: qty 60

## 2011-07-11 MED ORDER — ACETAMINOPHEN 325 MG PO TABS
650.0000 mg | ORAL_TABLET | Freq: Once | ORAL | Status: AC
  Administered 2011-07-11: 650 mg via ORAL

## 2011-07-11 NOTE — Telephone Encounter (Signed)
Pt's son called requesting advisement from MD - Does pt need to continue taking B12, Folic Acid and Magnesium supplements. Son is trying to organize pt's medications at her home.

## 2011-07-11 NOTE — Progress Notes (Signed)
   [facility]    OFFICE PROGRESS NOTE   INTERVAL HISTORY:   She returns as scheduled. She reports stable bowels difficulty. Her son has noted that she has difficulty with memory recall. She has occasional headaches.  She continues to bowl. Good appetite.  Objective:  Vital signs in last 24 hours:  Blood pressure 154/83, pulse 73, temperature 97 F (36.1 C), temperature source Oral, height 5\' 4"  (1.626 m), weight 130 lb 12.8 oz (59.33 kg).    HEENT: Neck without mass Lymphatics: No cervical, supraclavicular, or axillary nodes Resp: End inspiratory rales at the posterior bases, no respiratory distress Cardio: Regular rate and rhythm GI: Nontender, no hepatosplenomegaly Vascular: No leg edema Neuro: Alert, oriented to place and date, the gait is ataxic-Edris to the right      Lab Results:  Lab Results  Component Value Date   WBC 3.8* 05/11/2011   HGB 12.4 05/11/2011   HCT 36.2 05/11/2011   MCV 97.6 05/11/2011   PLT 252 05/11/2011   ANC 2.0  X-rays: MRI of the brain without contrast on 07/07/2011-post therapy changes at the left temporal lobe with a minimal change in appearance of T2 hyperintensity involving the anterior left upper lobe. Atrophy and ventricular prominence appears stable. Remote infarcts at the right thalamus and left cerebellum. No acute infarct.    Medications: I have reviewed the patient's current medications.  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  a. 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  b. Initiation of weekly rituximab therapy 08/31/2010, status post week number 4 on 09/23/2010. c. Restaging MRI of the brain 10/07/2010 without evidence of disease progression. d. Initiation of every 2 month "maintenance" rituximab on 11/16/2010. Last treated on  05/11/2011. e. Restaging MRI of the brain 02/06/2011 unchanged aside from a slight increase in altered T2 signal in the subcortical region at the anterior left temporal lobe. f. Restaging MRI of the brain 07/07/2011-minimal change in appearance of T2 hyperintensity involving the anterior left temporal lobe 2. Gait ataxia and memory deficit, question related to the central nervous system lymphoma. She appears stable today. 3. Hypertension. 4. Nonischemic cardiomyopathy. 5. Status post aortic valve replacement. 6. Transient ischemic attack in 2010. 7. Chronic renal insufficiency   Disposition:  She appears stable. No clear evidence of disease progression based on her physical exam and the restaging MRI. I discussed the situation with her son. The plan is to continue maintenance rituximab for at least one year.  It has become increasingly difficult for her to live alone. Her son is checking into home care assistance, though they may need to pursue placement or living with a family member in the future.  She will return for an office visit and rituximab in 2 months.   Thornton Papas, MD  07/11/2011  1:50 PM

## 2011-07-11 NOTE — Patient Instructions (Addendum)
[facility] Discharge Instructions for Patients Receiving Chemotherapy  Today you received the following chemotherapy agents Rituxan   To help prevent nausea and vomiting after your treatment, we encourage you to take your nausea medication    If you develop nausea and vomiting that is not controlled by your nausea medication, call the clinic. If it is after clinic hours your family physician or the after hours number for the clinic or go to the Emergency Department.   BELOW ARE SYMPTOMS THAT SHOULD BE REPORTED IMMEDIATELY:  *FEVER GREATER THAN 100.5 F  *CHILLS WITH OR WITHOUT FEVER  NAUSEA AND VOMITING THAT IS NOT CONTROLLED WITH YOUR NAUSEA MEDICATION  *UNUSUAL SHORTNESS OF BREATH  *UNUSUAL BRUISING OR BLEEDING  TENDERNESS IN MOUTH AND THROAT WITH OR WITHOUT PRESENCE OF ULCERS  *URINARY PROBLEMS  *BOWEL PROBLEMS  UNUSUAL RASH Items with * indicate a potential emergency and should be followed up as soon as possible.  One of the nurses will contact you 24 hours after your treatment. Please let the nurse know about any problems that you may have experienced. Feel free to call the clinic you have any questions or concerns. The clinic phone number is [phone].   I have been informed and understand all the instructions given to me. I know to contact the clinic, my physician, or go to the Emergency Department if any problems should occur. I do not have any questions at this time, but understand that I may call the clinic during office hours   should I have any questions or need assistance in obtaining follow up care.    __________________________________________  _____________  __________ Signature of Patient or Authorized Representative            Date                   Time    __________________________________________ Nurse's Signature    

## 2011-07-11 NOTE — Telephone Encounter (Signed)
appts made and printed for pt aom °

## 2011-07-11 NOTE — Telephone Encounter (Signed)
Yes on folate and B12.  No on magnesium

## 2011-07-12 NOTE — Telephone Encounter (Signed)
Pt's son advised.

## 2011-09-07 ENCOUNTER — Other Ambulatory Visit: Payer: Medicare Other | Admitting: Lab

## 2011-09-07 ENCOUNTER — Ambulatory Visit: Payer: Medicare Other | Admitting: Oncology

## 2011-09-08 ENCOUNTER — Telehealth: Payer: Self-pay | Admitting: Oncology

## 2011-09-08 NOTE — Telephone Encounter (Signed)
lmonvm adviisng  The pt's caregiver of the r/s aug appts on 09/15/2011

## 2011-09-14 ENCOUNTER — Other Ambulatory Visit: Payer: Self-pay | Admitting: Oncology

## 2011-09-15 ENCOUNTER — Telehealth: Payer: Self-pay | Admitting: Oncology

## 2011-09-15 ENCOUNTER — Encounter: Payer: Self-pay | Admitting: Nurse Practitioner

## 2011-09-15 ENCOUNTER — Other Ambulatory Visit: Payer: Medicare Other | Admitting: Lab

## 2011-09-15 ENCOUNTER — Ambulatory Visit: Payer: Medicare Other | Admitting: Oncology

## 2011-09-15 ENCOUNTER — Other Ambulatory Visit (HOSPITAL_BASED_OUTPATIENT_CLINIC_OR_DEPARTMENT_OTHER): Payer: Medicare Other | Admitting: Lab

## 2011-09-15 ENCOUNTER — Ambulatory Visit (HOSPITAL_BASED_OUTPATIENT_CLINIC_OR_DEPARTMENT_OTHER): Payer: Medicare Other | Admitting: Nurse Practitioner

## 2011-09-15 ENCOUNTER — Ambulatory Visit (HOSPITAL_BASED_OUTPATIENT_CLINIC_OR_DEPARTMENT_OTHER): Payer: Medicare Other

## 2011-09-15 VITALS — BP 152/88 | HR 66 | Temp 97.4°F | Resp 20

## 2011-09-15 VITALS — BP 151/81 | HR 69 | Temp 97.0°F | Resp 20 | Ht 64.0 in | Wt 131.1 lb

## 2011-09-15 DIAGNOSIS — C8599 Non-Hodgkin lymphoma, unspecified, extranodal and solid organ sites: Secondary | ICD-10-CM

## 2011-09-15 DIAGNOSIS — Z5112 Encounter for antineoplastic immunotherapy: Secondary | ICD-10-CM | POA: Diagnosis not present

## 2011-09-15 DIAGNOSIS — C8590 Non-Hodgkin lymphoma, unspecified, unspecified site: Secondary | ICD-10-CM

## 2011-09-15 DIAGNOSIS — C8589 Other specified types of non-Hodgkin lymphoma, extranodal and solid organ sites: Secondary | ICD-10-CM | POA: Diagnosis not present

## 2011-09-15 DIAGNOSIS — N289 Disorder of kidney and ureter, unspecified: Secondary | ICD-10-CM | POA: Diagnosis not present

## 2011-09-15 LAB — CBC WITH DIFFERENTIAL/PLATELET
Basophils Absolute: 0 10*3/uL (ref 0.0–0.1)
Eosinophils Absolute: 0.6 10*3/uL — ABNORMAL HIGH (ref 0.0–0.5)
HGB: 13.7 g/dL (ref 11.6–15.9)
LYMPH%: 18.1 % (ref 14.0–49.7)
MCH: 33.7 pg (ref 25.1–34.0)
MCV: 95.8 fL (ref 79.5–101.0)
MONO%: 13.5 % (ref 0.0–14.0)
NEUT#: 2.9 10*3/uL (ref 1.5–6.5)
NEUT%: 57.1 % (ref 38.4–76.8)
Platelets: 168 10*3/uL (ref 145–400)

## 2011-09-15 MED ORDER — SODIUM CHLORIDE 0.9 % IV SOLN
375.0000 mg/m2 | Freq: Once | INTRAVENOUS | Status: AC
  Administered 2011-09-15: 600 mg via INTRAVENOUS
  Filled 2011-09-15: qty 60

## 2011-09-15 MED ORDER — DIPHENHYDRAMINE HCL 25 MG PO CAPS
50.0000 mg | ORAL_CAPSULE | Freq: Once | ORAL | Status: AC
  Administered 2011-09-15: 50 mg via ORAL

## 2011-09-15 MED ORDER — SODIUM CHLORIDE 0.9 % IV SOLN
Freq: Once | INTRAVENOUS | Status: AC
  Administered 2011-09-15: 11:00:00 via INTRAVENOUS

## 2011-09-15 MED ORDER — ACETAMINOPHEN 325 MG PO TABS
650.0000 mg | ORAL_TABLET | Freq: Once | ORAL | Status: AC
Start: 2011-09-15 — End: 2011-09-15
  Administered 2011-09-15: 650 mg via ORAL

## 2011-09-15 NOTE — Patient Instructions (Addendum)
[facility] Discharge Instructions for Patients Receiving Chemotherapy  Today you received the following chemotherapy agents Rituxin  To help prevent nausea and vomiting after your treatment, we encourage you to take your nausea medication Begin taking it at 7 pm and take it as often as prescribed for the next 24 to 72 hours.   If you develop nausea and vomiting that is not controlled by your nausea medication, call the clinic. If it is after clinic hours your family physician or the after hours number for the clinic or go to the Emergency Department.   BELOW ARE SYMPTOMS THAT SHOULD BE REPORTED IMMEDIATELY:  *FEVER GREATER THAN 100.5 F  *CHILLS WITH OR WITHOUT FEVER  NAUSEA AND VOMITING THAT IS NOT CONTROLLED WITH YOUR NAUSEA MEDICATION  *UNUSUAL SHORTNESS OF BREATH  *UNUSUAL BRUISING OR BLEEDING  TENDERNESS IN MOUTH AND THROAT WITH OR WITHOUT PRESENCE OF ULCERS  *URINARY PROBLEMS  *BOWEL PROBLEMS  UNUSUAL RASH Items with * indicate a potential emergency and should be followed up as soon as possible.  One of the nurses will contact you 24 hours after your treatment. Please let the nurse know about any problems that you may have experienced. Feel free to call the clinic you have any questions or concerns. The clinic phone number is [phone].   I have been informed and understand all the instructions given to me. I know to contact the clinic, my physician, or go to the Emergency Department if any problems should occur. I do not have any questions at this time, but understand that I may call the clinic during office hours   should I have any questions or need assistance in obtaining follow up care.    __________________________________________  _____________  __________ Signature of Patient or Authorized Representative            Date                   Time    __________________________________________ Nurse's Signature    

## 2011-09-15 NOTE — Progress Notes (Signed)
[facility] OFFICE PROGRESS NOTE  Norma Regulus, MD [address] [place] [address]  DIAGNOSIS:  1. Lymphoma     CURRENT THERAPY: Rituxan every 2 months  (received weekly rituxan 08/31/2010 through 09/23/2010)  INTERVAL HISTORY: [name] 78 y.o. female returns for regular scheduled visit for followup of CNS lymphoma. She is quite energetic and vocal, despite complaints of generalized, vague fatigue. She is doing well overall and has no specific complaints apart from 'lack of energy'. She is accompanied by her son. She is eager to complete today's treatment as this marks the end of her 1 year of Rituxan. We plan to see her back in 2 months and repeat scans in December.   MEDICAL HISTORY: Past Medical History  Diagnosis Date  . Nonischemic cardiomyopathy 2007 / Feb 2009    Miminial non-obs CAD cath (2007); EF previously 25%;  55% by echo 2012  . Aortic insufficiency October 2007    Severe with ascending thoracic aneurysm; s/p pericardial tissue AVR and Heamsheild graft in October 2007; c/b pericardial effusion requiring window  . Hyperlipidemia   . Hypertension   . GERD (gastroesophageal reflux disease)   . History of cholelithiasis     Gallbladder dysfunction  . History of non anemic vitamin B12 deficiency   . Allergic rhinitis   . TIA (transient ischemic attack) October 2010  . Brain cancer   . CNS lymphoma     INTERIM HISTORY: has HYPERLIPIDEMIA; ANEMIA, PERNICIOUS; ANEMIA, B12 DEFICIENCY; ANXIETY; DEPRESSION; HYPERTENSION; AORTIC REGURGITATION; Chronic diastolic heart failure; HEMORRHOIDS; RHINITIS; IBS; FATIGUE; GAIT IMBALANCE; DYSPNEA; HEADACHE, TENSION; UNSPECIFIED PERIPHERAL VASCULAR DISEASE; PARESTHESIA, HANDS; S/P AVR; EBV (-) primary lymphoma of brain; and Chest pain on her problem list.    ALLERGIES:   has no known allergies.  MEDICATIONS: She remains on Folic Acid, Altace, Toprol, Claritin, and Lasix as needed.  SURGICAL HISTORY:  Past  Surgical History  Procedure Date  . Tonsillectomy   . Aortic valve replacement 2007  . Bilateral vein stripping   . Left temporal tumor resected     REVIEW OF SYSTEMS:  Constitutional: negative Cardiovascular: negative for chest pain and chest pressure/discomfort Gastrointestinal: negative for constipation, diarrhea, nausea and vomiting Genitourinary:negative for hematuria Neurological: negative   PHYSICAL EXAMINATION: ECOG PERFORMANCE STATUS: 1 - Symptomatic but completely ambulatory  Filed Vitals:   09/15/11 0923  BP: 151/81  Pulse: 69  Temp: 97 F (36.1 C)  Resp: 20    GENERAL:alert, healthy, no distress, well nourished, well developed, cooperative and smiling SKIN: skin color, texture, turgor are normal HEAD: Normocephalic EYES: normal, EOMI, sclera clear EARS: External ears normal OROPHARYNX:no exudate and no erythema  NECK: supple LYMPH:  no palpable lymphadenopathy BREAST:not examined LUNGS: clear to auscultation  HEART: regular rate & rhythm, no murmurs and no gallops ABDOMEN:abdomen soft and no masses or organomegaly BACK: Back symmetric, no curvature., Some limitation of flexion EXTREMITIES:no edema, no clubbing, no cyanosis  NEURO: alert & oriented x 3 with fluent speech     LABORATORY DATA: Results for orders placed in visit on 09/15/11 (from the past 48 hour(s))  CBC WITH DIFFERENTIAL     Status: Abnormal   Collection Time   09/15/11  8:57 AM      Component Value Range Comment   WBC 5.0  3.9 - 10.3 10e3/uL    NEUT# 2.9  1.5 - 6.5 10e3/uL    HGB 13.7  11.6 - 15.9 g/dL    HCT 60.4  54.0 - 98.1 %  Platelets 168  145 - 400 10e3/uL    MCV 95.8  79.5 - 101.0 fL    MCH 33.7  25.1 - 34.0 pg    MCHC 35.1  31.5 - 36.0 g/dL    RBC 1.61  0.96 - 0.45 10e6/uL    RDW 12.8  11.2 - 14.5 %    lymph# 0.9  0.9 - 3.3 10e3/uL    MONO# 0.7  0.1 - 0.9 10e3/uL    Eosinophils Absolute 0.6 (*) 0.0 - 0.5 10e3/uL    Basophils Absolute 0.0  0.0 - 0.1 10e3/uL    NEUT%  57.1  38.4 - 76.8 %    LYMPH% 18.1  14.0 - 49.7 %    MONO% 13.5  0.0 - 14.0 %    EOS% 10.9 (*) 0.0 - 7.0 %    BASO% 0.4  0.0 - 2.0 %    nRBC 0  0 - 0 %       RADIOGRAPHIC STUDIES: No results found.   ASSESSMENT: Primary CNS lymphoma - s/p surgical resection at [facility] 07/20/2010 with pathology confirming diffuse large B-cell lymphoma. Initiation of weekly rituximab 08/31/2010, s/p week 4 on 09/23/2010.  Began maintenance rituximab q 2months on 11/16/2010. Last treated 07/11/2011 Restaging MRI 02/06/2011 = unchanged aside from slight increase in T2 subcortical region anterior left temporal lobe Restaging MRI 07/07/2011 = minimal change in appearance T2 hyperintensity involving left anterior temporal lobe Gait/ataxia and memory deficit - stable. Hypertension Nonischemic cardiomyopathy Status post aortic valve replacement Transient ischemic attack 2010 Chronic renal insufficiency   PLAN: Proceed with final treatment of Rituxan today. Return in 2 months with plans to re-scan brain in December unless she develops symptoms prior to that.    All questions were answered. The patient knows to call the clinic with any problems, questions or concerns. We can certainly see the patient much sooner if necessary.  The patient and plan discussed with G. Rolm Baptise, MD and he is in agreement with the aforementioned.  I spent 30 minutes counseling the patient face to face. The total time spent in the appointment was 40 minutes.   Bobbe Medico AOCNP, NP-C

## 2011-09-15 NOTE — Telephone Encounter (Signed)
appts made and printed for pt aom °

## 2011-09-17 ENCOUNTER — Other Ambulatory Visit: Payer: Self-pay | Admitting: Internal Medicine

## 2011-09-21 ENCOUNTER — Telehealth: Payer: Self-pay | Admitting: *Deleted

## 2011-09-21 NOTE — Telephone Encounter (Signed)
PT. STATES ON 09/14/11 SHE WAS BITTEN BY BUG ON HER LEFT ARM. PT. HAD CHEMO GIVEN IN HER LEFT ARM ON 09/15/11. OVER THE WEEKEND SHE NOTICED HER LEFT ARM FROM HER ELBOW TO WRIST WAS SWOLLEN AND ITCHING. THERE WERE SOME WATER BLISTERS ON THE ARM BUT THEY ARE GONE NOW. ALSO THE BACK OF HER NECK IS SWOLLEN AND BUMPY. IT ALSO ITCHES. YESTERDAY A FRIEND TOLD PT. HER FACE WAS BRUISED AND A LITTLE SWOLLEN. PT. DENIES ANY PROBLEMS BREATHING OR SWALLOWING. PT. HAS NOT FALLEN. VERBAL ORDER AND READ BACK TO DR.SHERRILL- THIS PROBLEM IS NOT RELATED TO PT.'S CHEMO. SHE NEEDS TO GO TO AN URGENT CARE OR CALL HER PRIMARY CARE PHYSICIAN. NOTIFIED PT. OF THE ABOVE INFORMATION. SHE VOICES UNDERSTANDING. PT. STATES WILL BE ABLE TO GET TRANSPORTATION.

## 2011-09-27 ENCOUNTER — Ambulatory Visit (INDEPENDENT_AMBULATORY_CARE_PROVIDER_SITE_OTHER): Payer: Medicare Other | Admitting: Internal Medicine

## 2011-09-27 ENCOUNTER — Other Ambulatory Visit (INDEPENDENT_AMBULATORY_CARE_PROVIDER_SITE_OTHER): Payer: Medicare Other

## 2011-09-27 ENCOUNTER — Encounter: Payer: Self-pay | Admitting: Internal Medicine

## 2011-09-27 VITALS — BP 124/78 | HR 88 | Temp 97.6°F | Resp 16 | Wt 129.0 lb

## 2011-09-27 DIAGNOSIS — D518 Other vitamin B12 deficiency anemias: Secondary | ICD-10-CM

## 2011-09-27 DIAGNOSIS — I10 Essential (primary) hypertension: Secondary | ICD-10-CM

## 2011-09-27 DIAGNOSIS — H6120 Impacted cerumen, unspecified ear: Secondary | ICD-10-CM

## 2011-09-27 DIAGNOSIS — R5383 Other fatigue: Secondary | ICD-10-CM

## 2011-09-27 DIAGNOSIS — R5381 Other malaise: Secondary | ICD-10-CM

## 2011-09-27 LAB — VITAMIN B12: Vitamin B-12: >1500 pg/mL — ABNORMAL HIGH (ref 211–911)

## 2011-09-27 NOTE — Progress Notes (Signed)
Subjective:    Patient ID: [name], female    DOB: 06-21-1919, 78 y.o.   MRN: [id number]  HPI [name] presents with complaint of low energy and easy fatigue. She feels too tired. She has had a lot of loss: son, nephew, brother. Labs reviewed and she had normal thyroid function in Feb '13, normal chemistry in August '13 except for creatinine of 1.6, normal Hgb in August '13. No B12 level is in EMR. She does sleep well. She admits to having allergy symptoms treated with claritin. Since her surgery and treatment for B-cell lymphoma brain her thinking and memory are not the same. She does complain of having fullness in the right ear, but denies any loss of hearing.   Past Medical History  Diagnosis Date  . Nonischemic cardiomyopathy 2007 / Feb 2009    Miminial non-obs CAD cath (2007); EF previously 25%;  55% by echo 2012  . Aortic insufficiency October 2007    Severe with ascending thoracic aneurysm; s/p pericardial tissue AVR and Heamsheild graft in October 2007; c/b pericardial effusion requiring window  . Hyperlipidemia   . Hypertension   . GERD (gastroesophageal reflux disease)   . History of cholelithiasis     Gallbladder dysfunction  . History of non anemic vitamin B12 deficiency   . Allergic rhinitis   . TIA (transient ischemic attack) October 2010  . Brain cancer   . CNS lymphoma    Past Surgical History  Procedure Date  . Tonsillectomy   . Aortic valve replacement 2007  . Bilateral vein stripping   . Left temporal tumor resected    Family History  Problem Relation Age of Onset  . Stroke Father   . Arthritis Other   . Coronary artery disease Other     Female 1st degree relative <50  . Diabetes Other   . Cerebral aneurysm Son    History   Social History  . Marital Status: Widowed    Spouse Name: N/A    Number of Children: N/A  . Years of Education: N/A   Occupational History  . Not on file.   Social History Main Topics  . Smoking status: Never Smoker    . Smokeless tobacco: Never Used  . Alcohol Use: No  . Drug Use: No  . Sexually Active: Not on file   Other Topics Concern  . Not on file   Social History Narrative   HSGMarried @ [phone], 1 years/ widowed 2007Lives alone in [place] was a housewifeNo h/o abuseEnd-of-Life: No CPR; DNI; no prolonged tube feeding.  Provided signed "Out of Facility Order" and completed and signed MOST form.  These should accompany her to hospital, etc.  She should share her wished with the family (04/01/10)    Current Outpatient Prescriptions on File Prior to Visit  Medication Sig Dispense Refill  . folic acid (FOLVITE) 1 MG tablet TAKE 1 TABLET ONCE DAILY.  30 tablet  5  . metoprolol succinate (TOPROL-XL) 50 MG 24 hr tablet Take 1 tablet (50 mg total) by mouth daily.  30 tablet  6  . ramipril (ALTACE) 2.5 MG capsule Take 1 capsule (2.5 mg total) by mouth daily.  30 capsule  11  . simvastatin (ZOCOR) 40 MG tablet Take 1 tablet (40 mg total) by mouth every evening.  30 tablet  11  . cyanocobalamin 2000 MCG tablet Take by mouth 2 (two) times daily.       . furosemide (LASIX) 20 MG tablet Take 1 tablet (20  mg total) by mouth daily.      . NON FORMULARY Take 1 capsule by mouth daily. Resp Eleven- herbal supplement          Review of Systems System review is negative for any constitutional, cardiac, pulmonary, GI or neuro symptoms or complaints other than as described in the HPI.     Objective:   Physical Exam Filed Vitals:   09/27/11 1028  BP: 124/78  Pulse: 88  Temp: 97.6 F (36.4 C)  Resp: 16   Wt Readings from Last 3 Encounters:  09/27/11 129 lb (58.514 kg)  09/15/11 131 lb 1.6 oz (59.467 kg)  07/11/11 130 lb 12.8 oz (59.33 kg)   Gen'l- very well preserved white woman who looks younger than her 89 years. HEENT - cerumn in both ears, impacted on the right Neck- supple, w/o thyromegaly Nodes - none in the supraclavicular region Cor- RRR, prominent valve sounds best at the RSB Pulm -  normal respirations Derm - no lesions, prominent veins across upper chest Neuro - A&O x 3, speech clear. Normal stand at station, mild problem with gait and balance, poor tandem gait.    Procedure Note :     Procedure :  Ear irrigation both ears   Indication:  Cerumen impaction   Risks, including pain, dizziness, eardrum perforation, bleeding, infection and others as well as benefits were explained to the patient in detail. Verbal consent was obtained and the patient agreed to proceed.    We used ear irrigation syringe filled with lukewarm water for irrigation. A large amount wax was recovered.    Tolerated well. Complications: None.   Postprocedure instructions :  Call if problems.       Assessment & Plan:  Cerumen impaction - irrigated successfully. She did experience some discomfort but there was no injury to the [facility].

## 2011-09-29 NOTE — Assessment & Plan Note (Signed)
BP Readings from Last 3 Encounters:  09/27/11 124/78  09/15/11 152/88  09/15/11 151/81   Good control.  Plan- continue present medications

## 2011-09-29 NOTE — Assessment & Plan Note (Signed)
Labs reviewed and she had normal thyroid function in Feb '13, normal chemistry in August '13 except for creatinine of 1.6, normal Hgb in August '13. Exam is non-focal. She actually is very active and independent for her age.  Plan  No further work-up at this time except to check a B12 level  Addendum - B12 >1500

## 2011-10-12 DIAGNOSIS — L570 Actinic keratosis: Secondary | ICD-10-CM | POA: Diagnosis not present

## 2011-10-12 DIAGNOSIS — L280 Lichen simplex chronicus: Secondary | ICD-10-CM | POA: Diagnosis not present

## 2011-10-12 DIAGNOSIS — L219 Seborrheic dermatitis, unspecified: Secondary | ICD-10-CM | POA: Diagnosis not present

## 2011-11-02 ENCOUNTER — Telehealth: Payer: Self-pay | Admitting: *Deleted

## 2011-11-02 NOTE — Telephone Encounter (Signed)
Patient called to report she has developed more shortness of breath over past 1-2 weeks when she is up walking. Has appointment with Dr. Truett Perna next week, but called to see if something could be done sooner? Suggested she contact her PCP to be evaluated first to assess her BP and cardiac status. Best to rule out these issues before looking into oncology diagnosis as cause. She understands and agrees to call Dr. Debby Bud.

## 2011-11-06 ENCOUNTER — Telehealth: Payer: Self-pay | Admitting: Oncology

## 2011-11-06 NOTE — Telephone Encounter (Signed)
S/w the pt's dtr and she is aware of the cancelled appts on 11/10/2011 that has been r/s to 11/14/2011

## 2011-11-07 ENCOUNTER — Other Ambulatory Visit: Payer: Self-pay | Admitting: *Deleted

## 2011-11-07 ENCOUNTER — Telehealth: Payer: Self-pay | Admitting: *Deleted

## 2011-11-07 NOTE — Telephone Encounter (Signed)
Called to request the rescheduled appointment of 10/22 be another day week of 11/13/11 so she can accompany patient to the appointment while she is [place] town [place]. Forwarded request to MD.

## 2011-11-09 NOTE — Telephone Encounter (Signed)
Per Dr. Truett Perna, reschedule for 10/21 at 2:15 with midlevel. Called daughter's # and left message to call office to reschedule. Recording did not identify name.

## 2011-11-10 ENCOUNTER — Ambulatory Visit: Payer: Medicare Other | Admitting: Oncology

## 2011-11-10 ENCOUNTER — Other Ambulatory Visit: Payer: Medicare Other | Admitting: Lab

## 2011-11-10 NOTE — Telephone Encounter (Signed)
Left VM at daughter's # and patient's # with new appointment date and time for 10/21 and requested them to call to confirm.

## 2011-11-13 ENCOUNTER — Encounter: Payer: Medicare Other | Admitting: Nurse Practitioner

## 2011-11-13 ENCOUNTER — Other Ambulatory Visit: Payer: Medicare Other | Admitting: Lab

## 2011-11-14 ENCOUNTER — Other Ambulatory Visit: Payer: Medicare Other | Admitting: Lab

## 2011-11-14 ENCOUNTER — Ambulatory Visit: Payer: Medicare Other | Admitting: Oncology

## 2011-11-14 ENCOUNTER — Telehealth: Payer: Self-pay | Admitting: Oncology

## 2011-11-14 NOTE — Telephone Encounter (Signed)
Gave pt appt calendar for MD visit for 11/16/11 with labs , daughter and patient aware of appt

## 2011-11-15 NOTE — Progress Notes (Signed)
[name] failed to keep her appointment today. She will be rescheduled.

## 2011-11-16 ENCOUNTER — Ambulatory Visit (HOSPITAL_COMMUNITY)
Admission: RE | Admit: 2011-11-16 | Discharge: 2011-11-16 | Disposition: A | Discharge status: Home / Self Care | Payer: Medicare Other | Source: Ambulatory Visit | Attending: Oncology | Admitting: Oncology

## 2011-11-16 ENCOUNTER — Other Ambulatory Visit: Payer: Self-pay | Admitting: Oncology

## 2011-11-16 ENCOUNTER — Ambulatory Visit (HOSPITAL_BASED_OUTPATIENT_CLINIC_OR_DEPARTMENT_OTHER): Payer: Medicare Other | Admitting: Lab

## 2011-11-16 ENCOUNTER — Telehealth: Payer: Self-pay | Admitting: Oncology

## 2011-11-16 ENCOUNTER — Other Ambulatory Visit (HOSPITAL_BASED_OUTPATIENT_CLINIC_OR_DEPARTMENT_OTHER): Payer: Medicare Other | Admitting: Lab

## 2011-11-16 ENCOUNTER — Ambulatory Visit (HOSPITAL_BASED_OUTPATIENT_CLINIC_OR_DEPARTMENT_OTHER): Payer: Medicare Other | Admitting: Oncology

## 2011-11-16 VITALS — BP 144/86 | HR 60 | Temp 96.6°F | Resp 18 | Ht 64.0 in | Wt 131.1 lb

## 2011-11-16 DIAGNOSIS — R110 Nausea: Secondary | ICD-10-CM | POA: Diagnosis not present

## 2011-11-16 DIAGNOSIS — C8589 Other specified types of non-Hodgkin lymphoma, extranodal and solid organ sites: Secondary | ICD-10-CM

## 2011-11-16 DIAGNOSIS — C8581 Other specified types of non-Hodgkin lymphoma, lymph nodes of head, face, and neck: Secondary | ICD-10-CM

## 2011-11-16 DIAGNOSIS — N189 Chronic kidney disease, unspecified: Secondary | ICD-10-CM

## 2011-11-16 DIAGNOSIS — R279 Unspecified lack of coordination: Secondary | ICD-10-CM | POA: Insufficient documentation

## 2011-11-16 DIAGNOSIS — C8339 Primary central nervous system lymphoma: Secondary | ICD-10-CM | POA: Diagnosis not present

## 2011-11-16 DIAGNOSIS — I10 Essential (primary) hypertension: Secondary | ICD-10-CM

## 2011-11-16 DIAGNOSIS — R42 Dizziness and giddiness: Secondary | ICD-10-CM | POA: Diagnosis not present

## 2011-11-16 DIAGNOSIS — H532 Diplopia: Secondary | ICD-10-CM | POA: Diagnosis not present

## 2011-11-16 DIAGNOSIS — C8599 Non-Hodgkin lymphoma, unspecified, extranodal and solid organ sites: Secondary | ICD-10-CM

## 2011-11-16 LAB — BASIC METABOLIC PANEL (CC13)
CO2: 25 mEq/L (ref 22–29)
Glucose: 111 mg/dl — ABNORMAL HIGH (ref 70–99)
Potassium: 4.4 mEq/L (ref 3.5–5.1)
Sodium: 135 mEq/L — ABNORMAL LOW (ref 136–145)

## 2011-11-16 LAB — CBC WITH DIFFERENTIAL/PLATELET
Basophils Absolute: 0 10*3/uL (ref 0.0–0.1)
EOS%: 10.1 % — ABNORMAL HIGH (ref 0.0–7.0)
Eosinophils Absolute: 0.5 10*3/uL (ref 0.0–0.5)
HGB: 13.7 g/dL (ref 11.6–15.9)
MONO#: 0.6 10*3/uL (ref 0.1–0.9)
NEUT#: 3.1 10*3/uL (ref 1.5–6.5)
RDW: 12.6 % (ref 11.2–14.5)
WBC: 4.9 10*3/uL (ref 3.9–10.3)
lymph#: 0.7 10*3/uL — ABNORMAL LOW (ref 0.9–3.3)

## 2011-11-16 NOTE — Progress Notes (Signed)
   [facility]    OFFICE PROGRESS NOTE   INTERVAL HISTORY:   She last received rituximab on 09/15/2011. No new complaint. Stable ataxia. She continues bowling. She no longer drives. She had an episode of transient diplopia a few weeks ago.  Objective:  Vital signs in last 24 hours:  Blood pressure 144/86, pulse 60, temperature 96.6 F (35.9 C), temperature source Oral, resp. rate 18, height 5\' 4"  (1.626 m), weight 131 lb 1.6 oz (59.467 kg).    HEENT: Neck without mass Resp: Coarse and inspiratory rhonchi at the extreme posterior base, no respiratory distress Cardio: Regular rate and rhythm GI: No hepatosplenomegaly, nontender Vascular: No leg edema Neuro: Alert and oriented. The face is symmetric, the extraocular movements appear intact. The motor exam appears intact in the upper and lower extremities. Finger to nose testing is normal. The gait is unsteady.    Lab Results:  Lab Results  Component Value Date   WBC 4.9 11/16/2011   HGB 13.7 11/16/2011   HCT 39.8 11/16/2011   MCV 98.5 11/16/2011   PLT 169 11/16/2011   ANC 3.1   Medications: I have reviewed the patient's current medications.  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  a. Status post surgical resection at [facility] 07/20/2010 with the pathology confirming a diffuse large B-cell lymphoma.  b. Initiation of weekly rituximab therapy 08/31/2010, status post week number 4 on 09/23/2010. c. Restaging MRI of the brain 10/07/2010 without evidence of disease progression. d. Initiation of every 2 month "maintenance" rituximab on 11/16/2010. Last treated on 09/15/2011 e. Restaging MRI of the brain 02/06/2011 unchanged aside from a slight increase in altered T2 signal in the subcortical region at the anterior left temporal lobe. f. Restaging MRI of the brain 07/07/2011-minimal change in appearance of T2  hyperintensity involving the anterior left temporal lobe 2. Gait ataxia and memory deficit, question related to the central nervous system lymphoma. She appears stable today. 3. Hypertension. 4. Nonischemic cardiomyopathy. 5. Status post aortic valve replacement. 6. Transient ischemic attack in 2010. 7. Chronic renal insufficiency  Disposition:  She appeared stable on presentation to the office today. Immediately after ambulation testing in the hallway she became nauseated. The blood pressure was elevated and she was noted to have premature beats on repeat cardiac examination. No chest pain, focal neurologic symptoms, or other symptoms. After resting in the examination room for several minutes she had an episode of vomiting a tan liquid material. She was given a single Zofran ODT tablet and the nausea improved.  The nausea persisted over a few hours in the office and she was referred for a CT of the brain. There were no acute findings. The left temporal resection site appeared unchanged from a previous MRI.  A repeat blood pressure was lower and the pulse was regular. She was able to drink fluids prior to discharge from the office. The etiology of the episode of nausea/vomiting is unclear. She will contact us or go to the emergency room for recurrent symptoms.  [name] will return for an office visit in one month. The plan is to consider obtaining a restaging MRI of the brain in 2 months.  Her daughter was present throughout the office visit today.     Thornton Papas, MD  11/16/2011  7:40 PM

## 2011-11-16 NOTE — Telephone Encounter (Signed)
Pt sent to labs today and CT, called Lilyan Punt for precert , call report to nurse

## 2011-11-17 ENCOUNTER — Telehealth: Payer: Self-pay | Admitting: *Deleted

## 2011-11-17 NOTE — Telephone Encounter (Signed)
Called patient to follow up on her nausea. No answer. Left voice mail on machine that we were checking on her. Call if Dr. Truett Perna is needed.

## 2011-11-20 ENCOUNTER — Ambulatory Visit (INDEPENDENT_AMBULATORY_CARE_PROVIDER_SITE_OTHER): Payer: Medicare Other | Admitting: Internal Medicine

## 2011-11-20 ENCOUNTER — Encounter: Payer: Self-pay | Admitting: Internal Medicine

## 2011-11-20 VITALS — BP 140/86 | HR 77 | Temp 97.2°F | Resp 16 | Wt 128.0 lb

## 2011-11-20 DIAGNOSIS — F329 Major depressive disorder, single episode, unspecified: Secondary | ICD-10-CM

## 2011-11-20 DIAGNOSIS — I10 Essential (primary) hypertension: Secondary | ICD-10-CM

## 2011-11-20 DIAGNOSIS — F22 Delusional disorders: Secondary | ICD-10-CM | POA: Diagnosis not present

## 2011-11-20 DIAGNOSIS — F3289 Other specified depressive episodes: Secondary | ICD-10-CM

## 2011-11-20 DIAGNOSIS — R269 Unspecified abnormalities of gait and mobility: Secondary | ICD-10-CM

## 2011-11-20 MED ORDER — BUPROPION HCL ER (XL) 150 MG PO TB24: 150.0000 mg | ORAL_TABLET | Freq: Every day | ORAL | Status: DC

## 2011-11-20 NOTE — Patient Instructions (Addendum)
Long standing issue and now without energy, no interest in doing her usual activities. She admits to perhaps being depressed. Meds reviewed and not a cause. Labs reviewed from October 13th - normal. She has trouble accepting the problems of her health.   Plan - trial of wellbutrin xl 150 mg q AM  Viral gastroenteritis - no fever, white blood count was normal. This is the season for this type of problem  Plan  Fluids  Bland diet   Time   Delusions - no real recent change. If you are more uncomfortable with these delusions we can start medication.   Please give consideration to congregate living rather than living alone.    Viral Gastroenteritis Viral gastroenteritis is also known as stomach flu. This condition affects the stomach and intestinal tract. It can cause sudden diarrhea and vomiting. The illness typically lasts 3 to 8 days. Most people develop an immune response that eventually gets rid of the virus. While this natural response develops, the virus can make you quite ill. CAUSES   Many different viruses can cause gastroenteritis, such as rotavirus or noroviruses. You can catch one of these viruses by consuming contaminated food or water. You may also catch a virus by sharing utensils or other personal items with an infected person or by touching a contaminated surface. SYMPTOMS   The most common symptoms are diarrhea and vomiting. These problems can cause a severe loss of body fluids (dehydration) and a body salt (electrolyte) imbalance. Other symptoms may include:  Fever.   Headache.   Fatigue.   Abdominal pain.  DIAGNOSIS   Your caregiver can usually diagnose viral gastroenteritis based on your symptoms and a physical exam. A stool sample may also be taken to test for the presence of viruses or other infections. TREATMENT   This illness typically goes away on its own. Treatments are aimed at rehydration. The most serious cases of viral gastroenteritis involve vomiting so  severely that you are not able to keep fluids down. In these cases, fluids must be given through an intravenous line (IV). HOME CARE INSTRUCTIONS    Drink enough fluids to keep your urine clear or pale yellow. Drink small amounts of fluids frequently and increase the amounts as tolerated.   Ask your caregiver for specific rehydration instructions.   Avoid:   Foods high in sugar.   Alcohol.   Carbonated drinks.   Tobacco.   Juice.   Caffeine drinks.   Extremely hot or cold fluids.   Fatty, greasy foods.   Too much intake of anything at one time.   Dairy products until 24 to 48 hours after diarrhea stops.   You may consume probiotics. Probiotics are active cultures of beneficial bacteria. They may lessen the amount and number of diarrheal stools in adults. Probiotics can be found in yogurt with active cultures and in supplements.   Wash your hands well to avoid spreading the virus.   Only take over-the-counter or prescription medicines for pain, discomfort, or fever as directed by your caregiver. Do not give aspirin to children. Antidiarrheal medicines are not recommended.   Ask your caregiver if you should continue to take your regular prescribed and over-the-counter medicines.   Keep all follow-up appointments as directed by your caregiver.  SEEK IMMEDIATE MEDICAL CARE IF:    You are unable to keep fluids down.   You do not urinate at least once every 6 to 8 hours.   You develop shortness of breath.   You notice  blood in your stool or vomit. This may look like coffee grounds.   You have abdominal pain that increases or is concentrated in one small area (localized).   You have persistent vomiting or diarrhea.   You have a fever.   The patient is a child younger than 3 months, and he or she has a fever.   The patient is a child older than 3 months, and he or she has a fever and persistent symptoms.   The patient is a child older than 3 months, and he or she has  a fever and symptoms suddenly get worse.   The patient is a baby, and he or she has no tears when crying.  MAKE SURE YOU:    Understand these instructions.   Will watch your condition.   Will get help right away if you are not doing well or get worse.  Document Released: 01/09/2005 Document Revised: 04/03/2011 Document Reviewed: 10/26/2010 [facility] Patient Information 2013 [place], [place].

## 2011-11-20 NOTE — Assessment & Plan Note (Addendum)
Long standing issue and now without energy, no interest in doing her usual activities. She admits to perhaps being depressed. Meds reviewed and not a cause. Labs reviewed from October 13th - normal. She has trouble accepting the problems of her health.   Plan - trial of wellbutrin xl 150 mg q AM

## 2011-11-20 NOTE — Progress Notes (Signed)
Subjective:    Patient ID: [name], female    DOB: [birth year], 78 y.o.   MRN: [id number]  HPI [name] presents for follow up after an episode of N/V while at Dr. Kalman Drape. She did have a head scan that revealed no acute abnormalty. She has had some mild persistent nausea but no emesis. She has continued to not feel well with on-going abdominal discomfort but no recurrent vomiting.  She has been having hallucinations for several years. She will be disoriented and see  loved ones - will awake looking for her son, for her decease husband;  she will also see threatening strangers that can make her fearful. This is not any worse now than it has been over the past several years. She is reticent to consider medications for fear of side affects.  She has no energy - she feels tired all the time. She does sleep well but her level of activity is much less than she would like.   Past Medical History  Diagnosis Date  . Nonischemic cardiomyopathy 2007 / Feb 2009    Miminial non-obs CAD cath (2007); EF previously 25%;  55% by echo 2012  . Aortic insufficiency October 2007    Severe with ascending thoracic aneurysm; s/p pericardial tissue AVR and Heamsheild graft in October 2007; c/b pericardial effusion requiring window  . Hyperlipidemia   . Hypertension   . GERD (gastroesophageal reflux disease)   . History of cholelithiasis     Gallbladder dysfunction  . History of non anemic vitamin B12 deficiency   . Allergic rhinitis   . TIA (transient ischemic attack) October 2010  . Brain cancer   . CNS lymphoma    Past Surgical History  Procedure Date  . Tonsillectomy   . Aortic valve replacement 2007  . Bilateral vein stripping   . Left temporal tumor resected    Family History  Problem Relation Age of Onset  . Stroke Father   . Arthritis Other   . Coronary artery disease Other     Female 1st degree relative <50  . Diabetes Other   . Cerebral aneurysm Son    History   Social  History  . Marital Status: Widowed    Spouse Name: N/A    Number of Children: N/A  . Years of Education: N/A   Occupational History  . Not on file.   Social History Main Topics  . Smoking status: Never Smoker   . Smokeless tobacco: Never Used  . Alcohol Use: No  . Drug Use: No  . Sexually Active: Not on file   Other Topics Concern  . Not on file   Social History Narrative   HSGMarried @ [phone], 20 years/ widowed 2007[place] was a housewifeNo h/o abuseEnd-of-Life: No CPR; DNI; no prolonged tube feeding.  Provided signed "Out of Facility Order" and completed and signed MOST form.  These should accompany her to hospital, etc.  She should share her wished with the family (04/01/10)    Current Outpatient Prescriptions on File Prior to Visit  Medication Sig Dispense Refill  . cyanocobalamin 2000 MCG tablet Take by mouth 2 (two) times daily.       . folic acid (FOLVITE) 1 MG tablet TAKE 1 TABLET ONCE DAILY.  30 tablet  5  . Loratadine-Pseudoephedrine (CLARITIN-D 24 HOUR PO) Take 1 tablet by mouth daily.      . metoprolol succinate (TOPROL-XL) 50 MG 24 hr tablet Take 1 tablet (50 mg total) by mouth  daily.  30 tablet  6  . ramipril (ALTACE) 2.5 MG capsule Take 1 capsule (2.5 mg total) by mouth daily.  30 capsule  11  . simvastatin (ZOCOR) 40 MG tablet Take 1 tablet (40 mg total) by mouth every evening.  30 tablet  11  . buPROPion (WELLBUTRIN XL) 150 MG 24 hr tablet Take 1 tablet (150 mg total) by mouth daily.  30 tablet  3  . furosemide (LASIX) 20 MG tablet Take 1 tablet (20 mg total) by mouth daily.         Review of Systems System review is negative for any constitutional, cardiac, pulmonary, GI or neuro symptoms or complaints other than as described in the HPI.     Objective:   Physical Exam Filed Vitals:   11/20/11 1708  BP: 140/86  Pulse: 77  Temp: 97.2 F (36.2 C)  Resp: 16   Gen'l- WNWD white woman who is in no acute distress HEENT- C&S clear, no  icterus, PERRLA Cor- RRR PUlm - normal respirations Neuro - A&O x 3, CN II-XII normal, gait - independent but a little frail       Assessment & Plan:  Viral gastroenteritis - no fever, white blood count was normal. This is the season for this type of problem  Plan  Fluids  Bland diet   Time

## 2011-11-20 NOTE — Assessment & Plan Note (Signed)
Patient reports that this has been a problems for several years and is not particularly worse. She is very functional.  Plan - see depression  If delusions become more threatening can consider atypical anti-psychotic.

## 2011-11-22 NOTE — Assessment & Plan Note (Signed)
No change in condition. She does use a cane.

## 2011-11-22 NOTE — Assessment & Plan Note (Signed)
BP Readings from Last 3 Encounters:  11/20/11 140/86  11/16/11 144/86  09/27/11 124/78   OK control - wary of lowering BP too much re: watershed infarcts.

## 2011-11-24 ENCOUNTER — Telehealth: Payer: Self-pay | Admitting: *Deleted

## 2011-11-24 NOTE — Telephone Encounter (Signed)
Daughter reports trying to schedule MRI scan, but was told MD had not yet placed order. Requests MRI be ordered and scheduled for December 22nd or 23rd when her son is available to be here with her. Forwarded message to MD. Next office visit is 12/20/11.

## 2011-11-27 ENCOUNTER — Ambulatory Visit: Payer: Medicare Other | Admitting: Oncology

## 2011-11-30 ENCOUNTER — Other Ambulatory Visit: Payer: Self-pay | Admitting: *Deleted

## 2011-11-30 ENCOUNTER — Other Ambulatory Visit: Payer: Self-pay | Admitting: Oncology

## 2011-11-30 DIAGNOSIS — C8599 Non-Hodgkin lymphoma, unspecified, extranodal and solid organ sites: Secondary | ICD-10-CM

## 2011-11-30 NOTE — Progress Notes (Signed)
Per Dr. Truett Perna: OK to order MRI as requested by family. Had planned to order it on her 11/27 visit, but will order now to ease their anxiety. Orders placed in EPIC for radiology.

## 2011-12-01 ENCOUNTER — Ambulatory Visit: Payer: Medicare Other | Admitting: Oncology

## 2011-12-08 ENCOUNTER — Other Ambulatory Visit: Payer: Self-pay | Admitting: Cardiology

## 2011-12-08 MED ORDER — RAMIPRIL 2.5 MG PO CAPS: 2.5000 mg | ORAL_CAPSULE | Freq: Every day | ORAL | Status: DC

## 2011-12-20 ENCOUNTER — Telehealth: Payer: Self-pay | Admitting: Oncology

## 2011-12-20 ENCOUNTER — Ambulatory Visit (HOSPITAL_BASED_OUTPATIENT_CLINIC_OR_DEPARTMENT_OTHER): Payer: Medicare Other | Admitting: Oncology

## 2011-12-20 VITALS — BP 138/86

## 2011-12-20 DIAGNOSIS — R269 Unspecified abnormalities of gait and mobility: Secondary | ICD-10-CM

## 2011-12-20 DIAGNOSIS — N189 Chronic kidney disease, unspecified: Secondary | ICD-10-CM

## 2011-12-20 DIAGNOSIS — I10 Essential (primary) hypertension: Secondary | ICD-10-CM

## 2011-12-20 DIAGNOSIS — C8581 Other specified types of non-Hodgkin lymphoma, lymph nodes of head, face, and neck: Secondary | ICD-10-CM | POA: Diagnosis not present

## 2011-12-20 DIAGNOSIS — C8599 Non-Hodgkin lymphoma, unspecified, extranodal and solid organ sites: Secondary | ICD-10-CM

## 2011-12-20 NOTE — Telephone Encounter (Signed)
appts made and printed for pt aom °

## 2011-12-20 NOTE — Patient Instructions (Signed)
Schedule appointment with Dr. Debby Bud to discuss shortness of breath and allergy symptoms

## 2011-12-20 NOTE — Progress Notes (Signed)
   [facility]    OFFICE PROGRESS NOTE   INTERVAL HISTORY:   She returns as scheduled. No new neurologic symptoms. She continues to have ataxia and reports a fall recently. She continues bowling. She complains of malaise and intermittent exertional dyspnea. The dyspnea has been a chronic symptom. She complains of rhinorrhea and "allergies ".  Objective:  Vital signs in last 24 hours:  Blood pressure 138/86.   Resp: End inspiratory rhonchi at the left greater than right base, no respiratory distress Cardio: Regular rate and rhythm GI: No hepatosplenomegaly Vascular: No leg edema Neuro: Alert and oriented, the motor exam appears intact in the upper and lower extremities, finger to nose testing is normal, she ambulates with a slightly unsteady gait and drifts to the right.      Lab Results:  Lab Results  Component Value Date   WBC 4.9 11/16/2011   HGB 13.7 11/16/2011   HCT 39.8 11/16/2011   MCV 98.5 11/16/2011   PLT 169 11/16/2011   ANC 3.1    Medications: I have reviewed the patient's current medications.  Assessment/Plan:  1. 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  a. Status post surgical resection at [facility] 07/20/2010 with the pathology confirming a diffuse large B-cell lymphoma.  b. Initiation of weekly rituximab therapy 08/31/2010, status post week number 4 on 09/23/2010. c. Restaging MRI of the brain 10/07/2010 without evidence of disease progression. d. Initiation of every 2 month "maintenance" rituximab on 11/16/2010. Last treated on 09/15/2011 e. Restaging MRI of the brain 02/06/2011 unchanged aside from a slight increase in altered T2 signal in the subcortical region at the anterior left temporal lobe. f. Restaging MRI of the brain 07/07/2011-minimal change in appearance of T2 hyperintensity involving the anterior left temporal lobe 2. Gait ataxia and memory  deficit, question related to the central nervous system lymphoma. She appears stable today. 3. Hypertension. 4. Nonischemic cardiomyopathy. 5. Status post aortic valve replacement. 6. Transient ischemic attack in 2010. 7. Chronic renal insufficiency   Disposition:  She appears unchanged. She continues to have gait ataxia. I encouraged her to followup with physical therapy and use a walker. She will schedule upon with Dr. Debby Bud to discuss the dyspnea and rhinorrhea. She is scheduled for a restaging brain MRI scan on 01/15/2012. She will return for an office visit with her son on 01/16/2012.   Thornton Papas, MD  12/20/2011  2:44 PM

## 2012-01-10 ENCOUNTER — Telehealth: Payer: Self-pay | Admitting: Oncology

## 2012-01-10 NOTE — Telephone Encounter (Signed)
Daughter called and left message, called daughter back and left message regarding appt for 01/16/12 MD only day after MRI

## 2012-01-15 ENCOUNTER — Other Ambulatory Visit: Payer: Self-pay | Admitting: Oncology

## 2012-01-15 ENCOUNTER — Ambulatory Visit (HOSPITAL_COMMUNITY)
Admission: RE | Admit: 2012-01-15 | Discharge: 2012-01-15 | Disposition: A | Discharge status: Home / Self Care | Payer: Medicare Other | Source: Ambulatory Visit | Attending: Oncology | Admitting: Oncology

## 2012-01-15 DIAGNOSIS — C8339 Primary central nervous system lymphoma: Secondary | ICD-10-CM | POA: Insufficient documentation

## 2012-01-15 DIAGNOSIS — I129 Hypertensive chronic kidney disease with stage 1 through stage 4 chronic kidney disease, or unspecified chronic kidney disease: Secondary | ICD-10-CM | POA: Insufficient documentation

## 2012-01-15 DIAGNOSIS — C8589 Other specified types of non-Hodgkin lymphoma, extranodal and solid organ sites: Secondary | ICD-10-CM | POA: Insufficient documentation

## 2012-01-15 DIAGNOSIS — C8599 Non-Hodgkin lymphoma, unspecified, extranodal and solid organ sites: Secondary | ICD-10-CM

## 2012-01-15 DIAGNOSIS — R262 Difficulty in walking, not elsewhere classified: Secondary | ICD-10-CM | POA: Diagnosis not present

## 2012-01-15 DIAGNOSIS — Z79899 Other long term (current) drug therapy: Secondary | ICD-10-CM | POA: Insufficient documentation

## 2012-01-15 DIAGNOSIS — N189 Chronic kidney disease, unspecified: Secondary | ICD-10-CM | POA: Diagnosis not present

## 2012-01-15 LAB — POCT I-STAT, CHEM 8
BUN: 29 mg/dL — ABNORMAL HIGH (ref 6–23)
Sodium: 136 mEq/L (ref 135–145)
TCO2: 26 mmol/L (ref 0–100)

## 2012-01-16 ENCOUNTER — Ambulatory Visit (HOSPITAL_BASED_OUTPATIENT_CLINIC_OR_DEPARTMENT_OTHER): Payer: Medicare Other | Admitting: Oncology

## 2012-01-16 ENCOUNTER — Telehealth: Payer: Self-pay | Admitting: Oncology

## 2012-01-16 VITALS — BP 172/88 | HR 77 | Temp 97.1°F | Resp 20 | Ht 64.0 in | Wt 128.4 lb

## 2012-01-16 DIAGNOSIS — C8581 Other specified types of non-Hodgkin lymphoma, lymph nodes of head, face, and neck: Secondary | ICD-10-CM

## 2012-01-16 DIAGNOSIS — R269 Unspecified abnormalities of gait and mobility: Secondary | ICD-10-CM | POA: Diagnosis not present

## 2012-01-16 DIAGNOSIS — N289 Disorder of kidney and ureter, unspecified: Secondary | ICD-10-CM

## 2012-01-16 DIAGNOSIS — I10 Essential (primary) hypertension: Secondary | ICD-10-CM | POA: Diagnosis not present

## 2012-01-16 DIAGNOSIS — C8599 Non-Hodgkin lymphoma, unspecified, extranodal and solid organ sites: Secondary | ICD-10-CM

## 2012-01-16 NOTE — Progress Notes (Signed)
   [facility]    OFFICE PROGRESS NOTE   INTERVAL HISTORY:   She returns as scheduled. She continues to have gait ataxia. She lives alone and continues to bowl. [name] reports a mild headache and left-sided neck discomfort for the past few days.  Objective:  Vital signs in last 24 hours:  Blood pressure 172/88, pulse 77, temperature 97.1 F (36.2 C), temperature source Oral, resp. rate 20, height 5\' 4"  (1.626 m), weight 128 lb 6.4 oz (58.242 kg).  Resp: Lungs clear bilaterally Cardio: Regular rate and rhythm GI: No hepatosplenomegaly Vascular: No leg edema Neuro: Alert and oriented, the motor exam appears intact in the upper and lower extremities. Finger to nose testing is normal. The gait is ataxic.   X-rays: Restaging MRI of the brain on 01/15/2012-compared to the most recent MRI from 07/07/2011-postoperative changes in the left hemisphere appear similar without findings to suggest recurrent tumor. Remote infarct medial right thalamus and left cerebellum.   Medications: I have reviewed the patient's current medications.  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  a. 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  b. Initiation of weekly rituximab therapy 08/31/2010, status post week number 4 on 09/23/2010. c. Restaging MRI of the brain 10/07/2010 without evidence of disease progression. d. Initiation of every 2 month "maintenance" rituximab on 11/16/2010. Last treated on 09/15/2011 e. Restaging MRI of the brain 02/06/2011 unchanged aside from a slight increase in altered T2 signal in the subcortical region at the anterior left temporal lobe. f. Restaging MRI of the brain 07/07/2011-minimal change in appearance of T2 hyperintensity involving the anterior left temporal lobe g. Restaging MRI of  the brain 01/15/2012-no significant change to suggest progression of tumor given limits of the unenhanced study 2. Gait ataxia and memory deficit, question related to the central nervous system lymphoma. She appears stable today. 3. Hypertension. 4. Nonischemic cardiomyopathy. 5. Status post aortic valve replacement. 6. Transient ischemic attack in 2010. 7. Chronic renal insufficiency  Disposition:  She appears stable from a neurologic standpoint. No clinical or x-ray evidence for progression of the CNS lymphoma. The plan is to continue following her with observation approach.  She has experienced multiple falls at home. I encouraged her to followup with physical therapy as she may benefit from a walker. The patient's son will contact Dr. Debby Bud to discuss the hypertension, renal insufficiency, use of an ACE inhibitor, and rhinorrhea.  [name] will return for an office visit in 2 months.   Thornton Papas, MD  01/16/2012  5:39 PM

## 2012-01-16 NOTE — Telephone Encounter (Signed)
Gave appt for February 2014 MD only

## 2012-02-17 DIAGNOSIS — T23229A Burn of second degree of unspecified single finger (nail) except thumb, initial encounter: Secondary | ICD-10-CM | POA: Diagnosis not present

## 2012-02-18 ENCOUNTER — Other Ambulatory Visit: Payer: Self-pay | Admitting: Cardiology

## 2012-02-19 NOTE — Telephone Encounter (Signed)
..   Requested Prescriptions   Pending Prescriptions Disp Refills  . TOPROL XL 50 MG 24 hr tablet [Pharmacy Med Name: TOPROL XL 50 MG TABLET] 30 tablet 2    Sig: TAKE 1 TABLET ONCE DAILY.  ..Marland KitchenPatient needs to contact office to schedule  Appointment  for future refills.Ph:[phone]. Thank you.

## 2012-03-18 ENCOUNTER — Ambulatory Visit: Payer: Medicare Other | Admitting: Oncology

## 2012-03-22 ENCOUNTER — Telehealth: Payer: Self-pay | Admitting: Oncology

## 2012-03-22 ENCOUNTER — Ambulatory Visit (HOSPITAL_BASED_OUTPATIENT_CLINIC_OR_DEPARTMENT_OTHER): Payer: Medicare Other | Admitting: Oncology

## 2012-03-22 VITALS — BP 182/97 | HR 82 | Temp 97.5°F | Resp 20 | Ht 64.0 in | Wt 130.9 lb

## 2012-03-22 DIAGNOSIS — R269 Unspecified abnormalities of gait and mobility: Secondary | ICD-10-CM

## 2012-03-22 DIAGNOSIS — R5383 Other fatigue: Secondary | ICD-10-CM | POA: Diagnosis not present

## 2012-03-22 DIAGNOSIS — C8589 Other specified types of non-Hodgkin lymphoma, extranodal and solid organ sites: Secondary | ICD-10-CM | POA: Diagnosis not present

## 2012-03-22 DIAGNOSIS — I10 Essential (primary) hypertension: Secondary | ICD-10-CM

## 2012-03-22 DIAGNOSIS — C8339 Primary central nervous system lymphoma: Secondary | ICD-10-CM

## 2012-03-22 DIAGNOSIS — C8599 Non-Hodgkin lymphoma, unspecified, extranodal and solid organ sites: Secondary | ICD-10-CM

## 2012-03-22 DIAGNOSIS — R5381 Other malaise: Secondary | ICD-10-CM

## 2012-03-22 NOTE — Progress Notes (Signed)
   [facility]    OFFICE PROGRESS NOTE   INTERVAL HISTORY:   She returns as scheduled. She complains of malaise. She continues to bowl. The ataxia persists. No recent fall. She reports exertional dyspnea.  Objective:  Vital signs in last 24 hours:  Blood pressure 182/97, pulse 82, temperature 97.5 F (36.4 C), temperature source Oral, resp. rate 20, height 5\' 4"  (1.626 m), weight 130 lb 14.4 oz (59.376 kg).   Resp: Lungs clear bilaterally Cardio: Regular rate and rhythm Vascular: No leg edema Neuro: Alert and oriented, the motor exam appears intact in the upper and lower extremities, finger to nose testing is normal, the gait is unsteady. She was able to and relayed in the hallway     Medications: I have reviewed the patient's current medications.  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  a. 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  b. Initiation of weekly rituximab therapy 08/31/2010, status post week number 4 on 09/23/2010. c. Restaging MRI of the brain 10/07/2010 without evidence of disease progression. d. Initiation of every 2 month "maintenance" rituximab on 11/16/2010. Last treated on 09/15/2011 e. Restaging MRI of the brain 02/06/2011 unchanged aside from a slight increase in altered T2 signal in the subcortical region at the anterior left temporal lobe. f. Restaging MRI of the brain 07/07/2011-minimal change in appearance of T2 hyperintensity involving the anterior left temporal lobe g. Restaging MRI of the brain 01/15/2012-no significant change to suggest progression of tumor given limits of the unenhanced study 2. Gait ataxia and memory deficit, question related to the central nervous system lymphoma. She appears stable today. 3. Hypertension. 4. Nonischemic  cardiomyopathy. 5. Status post aortic valve replacement. 6. Transient ischemic attack in 2010. 7. Chronic renal insufficiency  Disposition:  She appears stable. No clinical evidence for progression of the CNS lymphoma. I encouraged her to followup with Dr. Debby Bud for evaluation of the hypertension, malaise, and to discuss her medical regimen.  She will return for an office visit in 3 months.   Thornton Papas, MD  03/22/2012  6:14 PM

## 2012-03-22 NOTE — Telephone Encounter (Signed)
Gave pt appt for MAy 2014 MD only

## 2012-05-02 ENCOUNTER — Ambulatory Visit (INDEPENDENT_AMBULATORY_CARE_PROVIDER_SITE_OTHER): Payer: Medicare Other | Admitting: Internal Medicine

## 2012-05-02 ENCOUNTER — Encounter: Payer: Self-pay | Admitting: Physician Assistant

## 2012-05-02 ENCOUNTER — Ambulatory Visit (INDEPENDENT_AMBULATORY_CARE_PROVIDER_SITE_OTHER): Payer: Medicare Other | Admitting: Physician Assistant

## 2012-05-02 ENCOUNTER — Encounter: Payer: Self-pay | Admitting: Internal Medicine

## 2012-05-02 VITALS — BP 160/80 | HR 79 | Temp 97.4°F | Wt 136.0 lb

## 2012-05-02 VITALS — BP 142/88 | Ht 64.0 in | Wt 134.2 lb

## 2012-05-02 DIAGNOSIS — I10 Essential (primary) hypertension: Secondary | ICD-10-CM

## 2012-05-02 DIAGNOSIS — D518 Other vitamin B12 deficiency anemias: Secondary | ICD-10-CM | POA: Diagnosis not present

## 2012-05-02 DIAGNOSIS — I359 Nonrheumatic aortic valve disorder, unspecified: Secondary | ICD-10-CM

## 2012-05-02 DIAGNOSIS — Z954 Presence of other heart-valve replacement: Secondary | ICD-10-CM | POA: Diagnosis not present

## 2012-05-02 DIAGNOSIS — R42 Dizziness and giddiness: Secondary | ICD-10-CM

## 2012-05-02 DIAGNOSIS — M1990 Unspecified osteoarthritis, unspecified site: Secondary | ICD-10-CM

## 2012-05-02 DIAGNOSIS — I872 Venous insufficiency (chronic) (peripheral): Secondary | ICD-10-CM

## 2012-05-02 DIAGNOSIS — J310 Chronic rhinitis: Secondary | ICD-10-CM | POA: Diagnosis not present

## 2012-05-02 DIAGNOSIS — I5032 Chronic diastolic (congestive) heart failure: Secondary | ICD-10-CM | POA: Diagnosis not present

## 2012-05-02 LAB — BASIC METABOLIC PANEL
GFR: 48.97 mL/min — ABNORMAL LOW (ref >60.00)
Glucose, Bld: 109 mg/dL — ABNORMAL HIGH (ref 70–99)
Potassium: 4.4 mEq/L (ref 3.5–5.1)
Sodium: 137 mEq/L (ref 135–145)

## 2012-05-02 LAB — TSH: TSH: 0.61 u[IU]/mL (ref 0.35–5.50)

## 2012-05-02 MED ORDER — FLUTICASONE PROPIONATE 50 MCG/ACT NA SUSP: 2.0000 | Freq: Every day | NASAL | Status: DC

## 2012-05-02 MED ORDER — LORATADINE 10 MG PO TABS: 10.0000 mg | ORAL_TABLET | Freq: Every day | ORAL | Status: DC

## 2012-05-02 NOTE — Progress Notes (Signed)
[address], [address] [place], [place]  [address] Phone: [phone] Fax:  [phone]  Date:  05/02/2012   ID:  [name], DOB 04-16-1919, MRN [id number]  PCP:  Illene Regulus, MD  Primary Cardiologist:  Dr. Rollene Rotunda     History of Present Illness: [name] is a 79 y.o. female who returns for evaluation of high BP.  She has a hx of severe aortic regurgitation and nonischemic cardiomyopathy with an EF of 25-30%. She underwent bioprosthetic aortic valve replacement and replacement of her ascending aorta by Dr. Tyrone Sage October 2007.  This was complicated by pericardial and pleural effusion. She had a posterior pericardial window placed by a VATS procedure. Subsequently her ejection fraction has normalized to 55%. She has been plagued by chronic dyspnea in the past.  She had a TIA in October 2010 with some double vision. Last echo 03/2010: inferobasal HK, severe LVH, EF 55-60%, dynamic obstruction during Valsalva in mid cavity, peak velocity 232 cm/sec, peak gradient 22 mmHg, AVR ok, mod dilated aorta, mild LAE.  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  Last seen by Dr. Rollene Rotunda 03/2011.  She has recently been noted to have higher BPs at her oncology appts.  BP has been as high as 182/97.  She is unaware that her BP is high.  She does not check at home.  She denies chest pain. She has occasional dyspnea. She is NYHA class II-IIb. She denies orthopnea, PND. She has occasional pedal edema that she takes when necessary Lasix. She denies syncope. She had a recent episode of dizziness with blurry vision. This was short lived. It happened while sitting. She denies facial droop or difficulty with speech.  Labs (12/13):  K 4, creatinine 1.3, Hgb 12.9  Wt Readings from Last 3 Encounters:  05/02/12 134 lb 3.2 oz (60.873 kg)  03/22/12 130 lb 14.4 oz (59.376 kg)  01/16/12 128 lb 6.4 oz (58.242 kg)     Past  Medical History  Diagnosis Date  . Nonischemic cardiomyopathy 2007 / Feb 2009    Miminial non-obs CAD cath (2007); EF previously 25%;  55% by echo 2012  . Aortic insufficiency October 2007    Severe with ascending thoracic aneurysm; s/p pericardial tissue AVR and Heamsheild graft in October 2007; c/b pericardial effusion requiring window;  echo 03/2010: inferobasal HK, severe LVH, EF 55-60%, dynamic obstruction during Valsalva in mid cavity, peak velocity 232 cm/sec, peak gradient 22 mmHg, AVR ok, mod dilated aorta, mild LAE  . Hyperlipidemia   . Hypertension   . GERD (gastroesophageal reflux disease)   . History of cholelithiasis     Gallbladder dysfunction  . History of non anemic vitamin B12 deficiency   . Allergic rhinitis   . TIA (transient ischemic attack) October 2010  . Brain cancer   . CNS lymphoma     Current Outpatient Prescriptions  Medication Sig Dispense Refill  . folic acid (FOLVITE) 1 MG tablet TAKE 1 TABLET ONCE DAILY.  30 tablet  5  . furosemide (LASIX) 20 MG tablet Take 20 mg by mouth daily as needed.      . Loratadine-Pseudoephedrine (CLARITIN-D 24 HOUR PO) Take 1 tablet by mouth daily.      . ramipril (ALTACE) 2.5 MG capsule Take 1 capsule (2.5 mg total) by mouth daily.  30 capsule  3  . TOPROL XL 50 MG 24 hr tablet TAKE 1 TABLET ONCE DAILY.  30  tablet  2   No current facility-administered medications for this visit.    Allergies:   No Known Allergies  Social History:  The patient  reports that she has never smoked. She has never used smokeless tobacco. She reports that she does not drink alcohol or use illicit drugs.   ROS:  Please see the history of present illness.   She has an occasional cough.   All other systems reviewed and negative.   PHYSICAL EXAM: VS:  BP 142/88  Ht 5\' 4"  (1.626 m)  Wt 134 lb 3.2 oz (60.873 kg)  BMI 23.02 kg/m2 Repeat BP by me on the R:  150/90  Well nourished, well developed, in no acute distress HEENT: normal Neck: no  JVD Endo:  No TM Cardiac:  normal S1, S2; + S4, RRR; short 1/6 systolic murmur along the LSB Lungs:  clear to auscultation bilaterally, no wheezing, rhonchi or rales Abd: soft, nontender, no hepatomegaly Ext: trace bilateral ankle edema Skin: warm and dry Neuro:  CNs 2-12 intact, no focal abnormalities noted  EKG:  NSR, HR 81, normal axis, inferolateral T wave inversions, no change from prior tracing     ASSESSMENT AND PLAN:  1. Hypertension: She does take Claritin-D. I have asked her to stop this. She can take plain Claritin. I will also give her a low-salt diet. I have asked her to check her blood pressure at home.  She will make sure that her cuff is working appropriately. I have asked her to bring in a blood pressure diary and followup in the next 3-4 weeks. If her blood pressure remains above target, we can consider adjusting her medications. I would not recommend adjusting her medications too quickly to avoid significant hypotension.  Check a basic metabolic panel and TSH today. 2. Aortic Insufficiency, Status Post Tissue AVR:  Arrange followup echocardiogram. 3. Dyspnea: This is a chronic symptom. It appears to be stable. 4. Dizziness:  She has poor memory and cannot really describe to me what happened several weeks ago.  She does not describe TIA symptoms.  There was no spinning.  Exam is unremarkable.  Continue to monitor for now.  5. CNS Lymphoma: Followup with oncology as scheduled 6. Disposition: Followup with me in 3-4 weeks to review her blood pressure diary and make further recommendations, if any.  Luna Glasgow, PA-C  12:49 PM 05/02/2012

## 2012-05-02 NOTE — Patient Instructions (Addendum)
1. Blood pressure - has been running too high. Has been on Claritin D- was told to stop.  Plan -  Resume furosemide  Continue other BP meds  Check BP at home at communicate by via MyChart  2. Allergic Rhinits - recommend nasal inhalational steroids Plan Start Fluticasone 1-2 sprays to each nostril once a day. May take 3-5 days to see results  Con't Claritin as needed  Do not take Decongestants  3. Venous insufficiency - lower extremity edema. A mechanical problem Plan OTC working girls support hosiery- knee high or panty hose  Elevated your legs during the day  Check to see if your legs are better in the AM  4. DJD - has had problems in her hands and also c/o sharp pain in the right knee. No severe or major joint changes Plan Use the joints - soft rubber ball for a few minutes every day  Heat is good - warm water, gloves.   Lineament of choice, e.g. Aspercreme, etc to the sore joints  Tylenol 500 to 1,000 mg three times as day for pain - works best when taken on schedule  If tylenol fails we can start an OtC anti-inflammatory, e.g. Aleve  5. Hme/Onc - anemia stable  6. Renal insufficieny - reviewed to '11  Variable with a max of 1.8 but over the last 4 months 0.9 to 1.3 to 1.1. Pretty good renal function for age Plan No contra-indication to use of ACE-1  Venous Stasis and Chronic Venous Insufficiency As people age, the veins located in their legs may weaken and stretch. When veins weaken and lose the ability to pump blood effectively, the condition is called chronic venous insufficiency (CVI) or venous stasis. Almost all veins return blood back to the heart. This happens by:  The force of the heart pumping fresh blood pushes blood back to the heart.  Blood flowing to the heart from the force of gravity. In the deep veins of the legs, blood has to fight gravity and flow upstream back to the heart. Here, the leg muscles contract to pump blood back toward the heart. Vein walls are  elastic, and many veins have small valves that only allow blood to flow in one direction. When leg muscles contract, they push inward against the elastic vein walls. This squeezes blood upward, opens the valves, and moves blood toward the heart. When leg muscles relax, the vein wall also relaxes and the valves inside the vein close to prevent blood from flowing backward. This method of pumping blood out of the legs is called the venous pump. CAUSES  The venous pump works best while walking and leg muscles are contracting. But when a person sits or stands, blood pressure in leg veins can build. Deep veins are usually able to withstand short periods of inactivity, but long periods of inactivity (and increased pressure) can stretch, weaken, and damage vein walls. High blood pressure can also stretch and damage vein walls. The veins may no longer be able to pump blood back to the heart. Venous hypertension (high blood pressure inside veins) that lasts over time is a primary cause of CVI. CVI can also be caused by:   Deep vein thrombosis, a condition where a thrombus (blood clot) blocks blood flow in a vein.  Phlebitis, an inflammation of a superficial vein that causes a blood clot to form. Other risk factors for CVI may include:   Heredity.  Obesity.  Pregnancy.  Sedentary lifestyle.  Smoking.  Jobs requiring  long periods of standing or sitting in one place.  Age and gender:  Women in their 62's and 24's and men in their 93's are more prone to developing CVI. SYMPTOMS  Symptoms of CVI may include:   Varicose veins.  Ulceration or skin breakdown.  Lipodermatosclerosis, a condition that affects the skin just above the ankle, usually on the inside surface. Over time the skin becomes brown, smooth, tight and often painful. Those with this condition have a high risk of developing skin ulcers.  Reddened or discolored skin on the leg.  Swelling. DIAGNOSIS  Your caregiver can diagnose CVI  after performing a careful medical history and physical examination. To confirm the diagnosis, the following tests may also be ordered:   Duplex ultrasound.  Plethysmography (tests blood flow).  Venograms (x-ray using a special dye). TREATMENT The goals of treatment for CVI are to restore a person to an active life and to minimize pain or disability. Typically, CVI does not pose a serious threat to life or limb, and with proper treatment most people with this condition can continue to lead active lives. In most cases, mild CVI can be treated on an outpatient basis with simple procedures. Treatment methods include:   Elastic compression socks.  Sclerotherapy, a procedure involving an injection of a material that "dissolves" the damaged veins. Other veins in the network of blood vessels take over the function of the damaged veins.  Vein stripping (an older procedure less commonly used).  Laser Ablation surgery.  Valve repair. HOME CARE INSTRUCTIONS   Elastic compression socks must be worn every day. They can help with symptoms and lower the chances of the problem getting worse, but they do not cure the problem.  Only take over-the-counter or prescription medicines for pain, discomfort, or fever as directed by your caregiver.  Your caregiver will review your other medications with you. SEEK MEDICAL CARE IF:   You are confused about how to take your medications.  There is redness, swelling, or increasing pain in the affected area.  There is a red streak or line that extends up or down from the affected area.  There is a breakdown or loss of skin in the affected area, even if the breakdown is small.  You develop an unexplained oral temperature above 102 F (38.9 C).  There is an injury to the affected area. SEEK IMMEDIATE MEDICAL CARE IF:   There is an injury and open wound to the affected area.  Pain is not adequately relieved with pain medication prescribed or becomes  severe.  An oral temperature above 102 F (38.9 C) develops.  The foot/ankle below the affected area becomes suddenly numb or the area feels weak and hard to move. MAKE SURE YOU:   Understand these instructions.  Will watch your condition.  Will get help right away if you are not doing well or get worse. Document Released: 05/15/2006 Document Revised: 04/03/2011 Document Reviewed: 05/05/202008 [facility] Patient Information 2013 [place], [place].

## 2012-05-02 NOTE — Patient Instructions (Addendum)
STOP CLARITIN D  YOU CAN TAKE REGULAR CLARITIN  LAB TODAY; BMET, TSH  Your physician has requested that you have an echocardiogram. Echocardiography is a painless test that uses sound waves to create images of your heart. It provides your doctor with information about the size and shape of your heart and how well your heart's chambers and valves are working. This procedure takes approximately one hour. There are no restrictions for this procedure.  PLEASE SCHEDULE A NURSE VISIT FOR BP CHECK 1-2 WEEKS; MAKE SURE TO BRING YOUR BP CUFF FROM HOME PLEASE MAKE A FOLLOW UP APPT 3-4 WEEKS WITH [facility],PAC SAME DAY DR. Antoine Poche IS IN THE OFFICE  3 to 4 Gram Sodium Diet, No Added Salt (NAS) A 3 to 4 gram sodium diet restricts the amount of sodium in the diet to no more than 3 to 4 g or 3000 to 4000 mg daily. Limiting the amount of sodium is often used to help lower blood pressure. It is important if you have heart, liver, or kidney problems. Many foods contain sodium for flavor and sometimes as a preservative. When the amount of sodium in a diet needs to be low, it is important to know what to look for when choosing foods and drinks. The following includes some information and guidelines to help make it easier for you to adapt to a low sodium diet. QUICK TIPS  Do not add salt to food.  Avoid convenience items and fast food.  Choose unsalted snack foods.  Buy lower sodium products, often labeled as "lower sodium" or "no salt added."  Check food labels to learn how much sodium is in 1 serving.  When eating at a restaurant, ask that your food be prepared with less salt or none, if possible. READING FOOD LABELS FOR SODIUM INFORMATION The nutrition facts label is a good place to find how much sodium is in foods. Look for products with no more than 500 to 600 mg of sodium per meal and no more than 150 mg per serving. Remember that 3 to 4 g = 3000 to 4000 mg. The food label may also list foods  as:  Sodium-free: Less than 5 mg in a serving.  Very low sodium: 35 mg or less in a serving.  Low-sodium: 140 mg or less in a serving.  Light in sodium: 50% less sodium in a serving. For example, if a food that usually has 300 mg of sodium is changed to become light in sodium, it will have 150 mg of sodium.  Reduced sodium: 25% less sodium in a serving. For example, if a food that usually has 400 mg of sodium is changed to reduced sodium, it will have 300 mg of sodium. CHOOSING FOODS Grains  Avoid: Salted crackers and snack items. Bread stuffing and biscuit mixes. Seasoned rice or pasta mixes.  Choose: Unsalted snack items. English muffins, breads, and rolls. Homemade pancakes and waffles. Most cereals. Pasta. Meats  Avoid: Salted, canned, smoked, spiced, pickled meats, including fish and poultry. Bacon, ham, sausage, cold cuts, hot dogs, anchovies.  Choose: Low-sodium canned tuna and salmon. Fresh or frozen meat, poultry, and fish. Dairy  Avoid: Processed cheese and spreads. Cottage cheese. Buttermilk and condensed milk. Regular cheese.  Choose: Milk. Low-sodium cottage cheese. Yogurt. Sour cream. Low-sodium cheese. Fruits and Vegetables  Avoid: Regular canned vegetables. Regular canned tomato sauce and paste. Frozen vegetables in sauces. Olives. Rosita Fire. Relishes. Sauerkraut.  Choose: Low-sodium canned vegetables. Low-sodium tomato sauce and paste. Frozen or fresh  vegetables. Fresh and frozen fruit. Condiments  Avoid: Canned and packaged gravies. Worcestershire sauce. Tartar sauce. Barbecue sauce. Soy sauce. Steak sauce. Ketchup. Onion, garlic, and table salt. Meat flavorings and tenderizers.  Choose: Fresh and dried herbs and spices. Low-sodium varieties of mustard and ketchup. Lemon juice. Tabasco sauce. Horseradish. SAMPLE 3 TO 4 GRAM SODIUM MEAL PLAN  Breakfast / Sodium (mg)  1 cup low-fat milk / 143 mg  2 slices whole-wheat toast / 270 mg  1 tbs heart-healthy  margarine / 153 mg  1 hard-boiled egg / 139 mg Lunch / Sodium (mg)  1 cup raw carrots / 76 mg   cup hummus / 298 mg  1 cup low-fat milk / 143 mg   cup red grapes / 2 mg  1 cup low-sodium chicken and rice soup / 480 mg  10 low-sodium saltine crackers / 191 mg Dinner / Sodium (mg)  1 cup whole-wheat pasta / 2 mg  1 cup tomato sauce / 1178 mg  3 oz lean ground beef / 57 mg  1 small side salad (1 cup raw spinach leaves,  cup cucumber,  cup yellow bell pepper) / 25 mg  1 tsp ranch dressing / 144 mg Snack / Sodium (mg)  1 slice cheddar cheese / 258 mg  1 medium apple / 1 mg Nutrient Analysis  Calories: 2005  Protein: 85 g  Carbohydrate: 245 g  Fat: 78 g  Sodium: 3560 mg Document Released: 01/09/2005 Document Revised: 04/03/2011 Document Reviewed: 04/12/2009 [facility] Patient Information 2013 [place], [place].

## 2012-05-02 NOTE — Progress Notes (Signed)
Subjective:    Patient ID: [name], female    DOB: 1919-05-04, 79 y.o.   MRN: [id number]  HPI Mrs. Lavell presents for multiple problems:  1. Blood pressure - has been running too high. Has been on Claritin D- was told to stop. Resume furosemide  2. Allergic Rhinits - recommend nasal inhalational steroids  3. Venous insufficiency - lower extremity edema.  4. DJD - has had problems in her hands and also c/o sharp pain in the right knee.   5. Hme/Onc - anemia stable  6. Renal insufficieny - reviewed to '11  Variable with a max of 1.8 but over the last 4 months 0.9 to 1.3 to 1.1. No contra-indication to use of ACE-1  Past Medical History  Diagnosis Date  . Nonischemic cardiomyopathy 2007 / Feb 2009    Miminial non-obs CAD cath (2007); EF previously 25%;  55% by echo 2012  . Aortic insufficiency October 2007    Severe with ascending thoracic aneurysm; s/p pericardial tissue AVR and Heamsheild graft in October 2007; c/b pericardial effusion requiring window;  echo 03/2010: inferobasal HK, severe LVH, EF 55-60%, dynamic obstruction during Valsalva in mid cavity, peak velocity 232 cm/sec, peak gradient 22 mmHg, AVR ok, mod dilated aorta, mild LAE  . Hyperlipidemia   . Hypertension   . GERD (gastroesophageal reflux disease)   . History of cholelithiasis     Gallbladder dysfunction  . History of non anemic vitamin B12 deficiency   . Allergic rhinitis   . TIA (transient ischemic attack) October 2010  . Brain cancer   . CNS lymphoma    Past Surgical History  Procedure Laterality Date  . Tonsillectomy    . Aortic valve replacement  2007  . Bilateral vein stripping    . Left temporal tumor resected     Family History  Problem Relation Age of Onset  . Stroke Father   . Arthritis Other   . Coronary artery disease Other     Female 1st degree relative <50  . Diabetes Other   . Cerebral aneurysm Son    History   Social History  . Marital Status: Widowed    Spouse Name:  N/A    Number of Children: N/A  . Years of Education: N/A   Occupational History  . Not on file.   Social History Main Topics  . Smoking status: Never Smoker   . Smokeless tobacco: Never Used  . Alcohol Use: No  . Drug Use: No  . Sexually Active: Not on file   Other Topics Concern  . Not on file   Social History Narrative   HSG   Married @ 1942, 79 years/ widowed 2007   Lives alone in [place]   She was a housewife   No h/o abuse   End-of-Life: No CPR; DNI; no prolonged tube feeding.  Provided signed "Out of Facility Order" and completed and signed MOST form.  These should accompany her to hospital, etc.  She should share her wished with the family (04/01/10)          Current Outpatient Prescriptions on File Prior to Visit  Medication Sig Dispense Refill  . folic acid (FOLVITE) 1 MG tablet TAKE 1 TABLET ONCE DAILY.  30 tablet  5  . furosemide (LASIX) 20 MG tablet Take 20 mg by mouth daily as needed.      . loratadine (CLARITIN) 10 MG tablet Take 1 tablet (10 mg total) by mouth daily.      Marland Kitchen  ramipril (ALTACE) 2.5 MG capsule Take 1 capsule (2.5 mg total) by mouth daily.  30 capsule  3  . TOPROL XL 50 MG 24 hr tablet TAKE 1 TABLET ONCE DAILY.  30 tablet  2   No current facility-administered medications on file prior to visit.      Review of Systems System review is negative for any constitutional, cardiac, pulmonary, GI or neuro symptoms or complaints other than as described in the HPI.     Objective:   Physical Exam Filed Vitals:   05/02/12 1511  BP: 160/80  Pulse: 79  Temp: 97.4 F (36.3 C)   Wt Readings from Last 3 Encounters:  05/02/12 136 lb (61.689 kg)  05/02/12 134 lb 3.2 oz (60.873 kg)  03/22/12 130 lb 14.4 oz (59.376 kg)   Gen'l - WNWD white woman looking younger than her stated age HEENT- C&S clear Cor- RRR Pulm - normal respirations MSK - normal ROM major joints; no crepitus in the left or right knee Neuro - A&O x 3, memory/mental status not  checked. Normal strength - able to rise from sitting w/o assistance. Normal gait using cane.       Assessment & Plan:

## 2012-05-03 ENCOUNTER — Telehealth: Payer: Self-pay | Admitting: *Deleted

## 2012-05-03 ENCOUNTER — Ambulatory Visit (HOSPITAL_COMMUNITY): Payer: Medicare Other | Attending: Cardiology | Admitting: Radiology

## 2012-05-03 DIAGNOSIS — I421 Obstructive hypertrophic cardiomyopathy: Secondary | ICD-10-CM | POA: Diagnosis not present

## 2012-05-03 DIAGNOSIS — Z954 Presence of other heart-valve replacement: Secondary | ICD-10-CM

## 2012-05-03 DIAGNOSIS — I359 Nonrheumatic aortic valve disorder, unspecified: Secondary | ICD-10-CM

## 2012-05-03 NOTE — Progress Notes (Signed)
Echocardiogram performed.  

## 2012-05-03 NOTE — Telephone Encounter (Signed)
lmom labs ok, no changes to be made 

## 2012-05-03 NOTE — Telephone Encounter (Signed)
Message copied by Tarri Fuller on Fri May 03, 2012 10:41 AM ------      Message from: [place], [place] T      Created: Thu May 02, 2012  5:13 PM       Renal fxn stable      K+ ok      TSH normal      Tereso Newcomer, PA-C  5:13 PM 05/02/2012 ------

## 2012-05-06 DIAGNOSIS — I872 Venous insufficiency (chronic) (peripheral): Secondary | ICD-10-CM | POA: Insufficient documentation

## 2012-05-06 DIAGNOSIS — M1990 Unspecified osteoarthritis, unspecified site: Secondary | ICD-10-CM | POA: Insufficient documentation

## 2012-05-06 NOTE — Assessment & Plan Note (Signed)
Reviewed mechanism of action - drawing and handout provided.  Plan otc support hosiery  Elevation of legs.

## 2012-05-06 NOTE — Assessment & Plan Note (Signed)
Seasonal allergic rhinits.  Plan Nasal inhalational steroid spray. Mechanism of action explained.

## 2012-05-06 NOTE — Assessment & Plan Note (Signed)
Doing well. No evidence of decompensation.  Plan Resume Ramipril

## 2012-05-06 NOTE — Assessment & Plan Note (Signed)
DJD - has had problems in her hands and also c/o sharp pain in the right knee. No severe or major joint changes Plan Use the joints - soft rubber ball for a few minutes every day  Heat is good - warm water, gloves.   Lineament of choice, e.g. Aspercreme, etc to the sore joints  Tylenol 500 to 1,000 mg three times as day for pain - works best when taken on schedule  If tylenol fails we can start an OtC anti-inflammatory, e.g. Aleve

## 2012-05-06 NOTE — Assessment & Plan Note (Signed)
BP Readings from Last 3 Encounters:  05/02/12 160/80  05/02/12 142/88  03/22/12 182/97   Elevated BP - she has been off ACE-I due to question of renal insufficiency. Reviewed labs - Creatinine has been stable  Plan Resume Ramipril  Monitor BP and report back or return OV for BP check in 6-8 weeks.

## 2012-05-06 NOTE — Assessment & Plan Note (Signed)
Lab Results  Component Value Date   HGB 12.9 01/15/2012   Doing very well in this regard. No change in regimen

## 2012-05-07 ENCOUNTER — Telehealth: Payer: Self-pay | Admitting: Internal Medicine

## 2012-05-07 ENCOUNTER — Encounter: Payer: Self-pay | Admitting: Physician Assistant

## 2012-05-07 NOTE — Telephone Encounter (Signed)
Patient Information:  Caller Name: Barrie Folk  Phone: [phone]  Patient: [name]  Gender: Female  DOB: 09/16/1919  Age: 79 Years  PCP: Illene Regulus (Adults only)  Office Follow Up:  Does the office need to follow up with this patient?: No  Instructions For The Office: N/A  RN Note:  Caller is eating, drinking, voiding, and interacting within normal limits. Caller has not applied direct pressure to head. Bump towards top of forehead by hairline. Caller states she had [name] wash cloth on head but was not holding it. Caller states she has [name] headache. Caller has not tried ice or pain medication. Caller states she remembers what happened.  Symptoms  Reason For Call & Symptoms: tripped and hit head on the floor- onset 1115AM  Reviewed Health History In EMR: Yes  Reviewed Medications In EMR: Yes  Reviewed Allergies In EMR: Yes  Reviewed Surgeries / Procedures: Yes  Date of Onset of Symptoms: 05/07/2012  Guideline(s) Used:  Head Injury  Disposition Per Guideline:   Home Care  Reason For Disposition Reached:   Minor head injury  Advice Given:  Pain Medicines:  For pain relief, you can take either acetaminophen, ibuprofen, or naproxen.  They are over-the-counter (OTC) pain drugs. You can buy them at the drugstore.  Acetaminophen (e.g., Tylenol):  Regular Strength Tylenol: Take 650 mg (two 325 mg pills) by mouth every 4-6 hours as needed. Each Regular Strength Tylenol pill has 325 mg of acetaminophen.  Extra Strength Tylenol: Take 1,000 mg (two 500 mg pills) every 8 hours as needed. Each Extra Strength Tylenol pill has 500 mg of acetaminophen.  The most you should take each day is 3,000 mg (10 Regular Strength or 6 Extra Strength pills [name] day).  Ibuprofen (e.g., Motrin, Advil):  Take 400 mg (two 200 mg pills) by mouth every 6 hours.  Another choice is to take 600 mg (three 200 mg pills) by mouth every 8 hours.  The most you should take each day is 1,200 mg (six 200 mg pills),  unless your doctor has told you to take more.  Call Back If:  You have more questions  You become worse.  Apply [name] Cold Pack:  Apply [name] cold pack or an ice bag (wrapped in [name] moist towel) to the area for 20 minutes. Repeat in 1 hour, then every 4 hours while awake.  Continue this for the first 48 hours after an injury.  This will help decrease pain and swelling.  Expected Course:  Most head trauma only causes an injury to the scalp. Pain, swelling, and bruising usually start to get better 2 to 3 days after an injury.  Swelling most often is gone after 1 week.  Bruises fade away slowly over 1-2 weeks.  It may take 2 weeks for pain and tenderness of the injured area to go away.  Diet:   Clear fluids to drink at first, in case of vomiting. May resume [name] regular diet after 2 hours.  Call Back If:  Severe headache  Extremity weakness or numbness occurs  Slurred speech or blurred vision occurs  Vomiting occurs  You become worse.  Reassurance - Superficial Laceration (Cut or Scratch) or Abrasion (Scrape):  This sounds like [name] small cut or scrape that we can treat at home.  Here is some care advice that should help.  Bleeding  : Apply direct pressure for 10 minutes with [name] sterile gauze to stop any bleeding.  Cleaning the Wound:  Wash the  wound with soap and water for 5 minutes.  For any dirt, scrub gently with [name] wash cloth.  For any bleeding, apply direct pressure with [name] sterile gauze or clean cloth for 10 minutes.  Antibiotic Ointment  : Apply an antibiotic ointment (e.g., OTC Bacitracin) to the wound twice [name] day.  Call Back If:  Looks infected (pus, redness, increasing tenderness)  Doesn't heal within 10 days  You become worse.  Patient Will Follow Care Advice:  YES

## 2012-05-15 ENCOUNTER — Ambulatory Visit (INDEPENDENT_AMBULATORY_CARE_PROVIDER_SITE_OTHER): Payer: Medicare Other | Admitting: Internal Medicine

## 2012-05-15 ENCOUNTER — Ambulatory Visit (INDEPENDENT_AMBULATORY_CARE_PROVIDER_SITE_OTHER): Payer: Medicare Other | Admitting: *Deleted

## 2012-05-15 ENCOUNTER — Ambulatory Visit (INDEPENDENT_AMBULATORY_CARE_PROVIDER_SITE_OTHER)
Admission: RE | Admit: 2012-05-15 | Discharge: 2012-05-15 | Disposition: A | Discharge status: Home / Self Care | Payer: Medicare Other | Source: Ambulatory Visit | Attending: Internal Medicine | Admitting: Internal Medicine

## 2012-05-15 ENCOUNTER — Encounter: Payer: Self-pay | Admitting: Internal Medicine

## 2012-05-15 VITALS — BP 130/84 | HR 73 | Temp 97.6°F | Wt 131.1 lb

## 2012-05-15 VITALS — BP 137/105 | HR 73 | Resp 16

## 2012-05-15 DIAGNOSIS — R079 Chest pain, unspecified: Secondary | ICD-10-CM

## 2012-05-15 DIAGNOSIS — I10 Essential (primary) hypertension: Secondary | ICD-10-CM

## 2012-05-15 DIAGNOSIS — R0781 Pleurodynia: Secondary | ICD-10-CM

## 2012-05-15 DIAGNOSIS — R269 Unspecified abnormalities of gait and mobility: Secondary | ICD-10-CM | POA: Diagnosis not present

## 2012-05-15 DIAGNOSIS — R51 Headache: Secondary | ICD-10-CM | POA: Diagnosis not present

## 2012-05-15 DIAGNOSIS — S298XXA Other specified injuries of thorax, initial encounter: Secondary | ICD-10-CM | POA: Diagnosis not present

## 2012-05-15 NOTE — Progress Notes (Signed)
Pt came in today for a blood pressure check.  She brought her home monitor as well.  With her home monitor BP was 137/105.  When checked here manually it is 146/90.  SHE HAS NOT TAKEN HER MEDICATIONS YET TODAY!! Advised pt she should take her medications daily at the same time every day.  Requested she get a pill box to help her remember to which she states she already has one.  Instructed her we need a true reading of her BP while taking medications.  Advised her to take medications and check BP about 1 hour after taking them, keep a dairy and bring it in when she sees [place], [place] back 05/29/2012.  She states understanding.  Of note-she reports a fall 4/15.  States she did not pass out and possibly fell because of her dog.  She fell from a step in her garage and hit her head on the concrete.  When assessing there is a noted knot on the left parietal area that appears to have bled.  It is not actively bleeding.  She reports since the fall over 9 days ago she has noticed more difficulty in walking and blurred vision.  This has not gotten any worse but is not appearing to be resolving either.  She will be seen today for evaluation by Dr Rene Paci.

## 2012-05-15 NOTE — Patient Instructions (Signed)
It was good to see you today. We have reviewed your prior records including labs and tests today Test(s) ordered today. Your results will be released to MyChart (or called to you) after review, usually within 72hours after test completion. If any changes need to be made, you will be notified at that same time. we'll make referral for head ct . Our office will contact you regarding appointment(s) once made. What we do next depends on what we find and how are you feeling -

## 2012-05-15 NOTE — Progress Notes (Signed)
Subjective:    Patient ID: [name], female    DOB: Dec 30, 1919, 79 y.o.   MRN: [id number]  HPI  Complains of fall one week ago, slipped on concrete floor garage Hit L side of head during process of fall but denies loss of consciousness Also left axillary chest wall pain, slowly increasing since time of fall Family reports concern for increase in balance problems since fall, chronic history of same Also nonspecific vision blurring, intermittent since fall Denies other injury  Past Medical History  Diagnosis Date  . Nonischemic cardiomyopathy 2007 / Feb 2009    Miminial non-obs CAD cath (2007); EF previously 25%;  55% by echo 2012;  Echo 4/14:  mild LVH; mod asymmetric hypertrophy of the septum, EF 55-60%, Gr 1 DD, AVR ok (mean gradient 9), MAC, mild LAE, PASP 31  . Aortic insufficiency October 2007    Severe with ascending thoracic aneurysm; s/p pericardial tissue AVR and Heamsheild graft in October 2007; c/b pericardial effusion requiring window;  echo 03/2010: inferobasal HK, severe LVH, EF 55-60%, dynamic obstruction during Valsalva in mid cavity, peak velocity 232 cm/sec, peak gradient 22 mmHg, AVR ok, mod dilated aorta, mild LAE  . Hyperlipidemia   . Hypertension   . GERD (gastroesophageal reflux disease)   . History of cholelithiasis     Gallbladder dysfunction  . History of non anemic vitamin B12 deficiency   . Allergic rhinitis   . TIA (transient ischemic attack) October 2010  . Brain cancer   . CNS lymphoma    Review of Systems     Objective:   Physical Exam BP 130/84  Pulse 73  Temp(Src) 97.6 F (36.4 C) (Oral)  Wt 131 lb 1.9 oz (59.476 kg)  BMI 22.5 kg/m2  SpO2 99% Constitutional: She appears well-developed and well-nourished. No distress. son at side Cardiovascular: Normal rate, regular rhythm and normal heart sounds.  No murmur heard. No BLE edema. Pulmonary/Chest: Effort normal and breath sounds normal. No respiratory distress. She has no  wheezes. Musculoskeletal: axillary chest wall pain approximately T7 left side to direct point palpation. Normal range of motion, no joint effusions. No gross deformities Neurological: She is alert and oriented to person, place, and time. No cranial nerve deficit. Coordination, balance, strength, speech and gait are normal.  Skin: left scalp with scabbed laceration over left temporal head near crown. No evidence of surrounding cellulitis, inflammation. Nontender to palpation. Negative hair because of scabbing. Remaining skin is warm and dry. No rash noted. No erythema.  Psychiatric: She has a normal mood and affect. Her behavior is normal. Judgment and thought content normal.   Lab Results  Component Value Date   WBC 4.9 11/16/2011   HGB 12.9 01/15/2012   HCT 38.0 01/15/2012   PLT 169 11/16/2011   GLUCOSE 109* 05/02/2012   CHOL 205* 04/01/2010   TRIG 176.0* 04/01/2010   HDL 33.60* 04/01/2010   LDLDIRECT 130.1 04/01/2010   LDLCALC 98 10/08/2009   ALT 9 09/16/2010   AST 16 09/16/2010   NA 137 05/02/2012   K 4.4 05/02/2012   CL 102 05/02/2012   CREATININE 1.1 05/02/2012   BUN 30* 05/02/2012   CO2 27 05/02/2012   TSH 0.61 05/02/2012   INR 0.98 11/11/2008        Assessment & Plan:   Scalp laceration, left temporal region following accidental injury/fall No evidence of complication - topical wound care symptomatic treatment advised  Mild headache with acute on chronic balance disorder - history of falls, most  recent reviewed Check head CT rule out subdural or intracranial hemorrhage  Left chest wall pain, rule out rib fracture  Advised on over-the-counter Tylenol or anti-inflammatory for pain Education reassurance provided Further treatment to depend on results and response to conservative care

## 2012-05-17 ENCOUNTER — Ambulatory Visit (INDEPENDENT_AMBULATORY_CARE_PROVIDER_SITE_OTHER)
Admission: RE | Admit: 2012-05-17 | Discharge: 2012-05-17 | Disposition: A | Discharge status: Home / Self Care | Payer: Medicare Other | Source: Ambulatory Visit | Attending: Internal Medicine | Admitting: Internal Medicine

## 2012-05-17 DIAGNOSIS — R51 Headache: Secondary | ICD-10-CM | POA: Diagnosis not present

## 2012-05-17 DIAGNOSIS — S1093XA Contusion of unspecified part of neck, initial encounter: Secondary | ICD-10-CM | POA: Diagnosis not present

## 2012-05-17 DIAGNOSIS — S0003XA Contusion of scalp, initial encounter: Secondary | ICD-10-CM | POA: Diagnosis not present

## 2012-05-17 DIAGNOSIS — R269 Unspecified abnormalities of gait and mobility: Secondary | ICD-10-CM

## 2012-05-17 NOTE — Progress Notes (Signed)
BP readings b/t her machine and our Hg cuff are fairly close. BP at Dr. Diamantina Monks office was optimal. Agree with BP diary and we can review in f/u. Tereso Newcomer, PA-C  1:27 PM 05/17/2012

## 2012-05-22 ENCOUNTER — Telehealth: Payer: Self-pay | Admitting: Physician Assistant

## 2012-05-22 NOTE — Telephone Encounter (Signed)
New Problem:    Patient called in wanting to know the results of her latest CT.  Please call back.

## 2012-05-22 NOTE — Telephone Encounter (Signed)
cbpt about results for her Head CT. I explained since Dr. Leschber ordered that office has to give the results, so I gave the pt the # for primary care, pt said thank you very much for my help. told her she was welcome 

## 2012-05-22 NOTE — Telephone Encounter (Signed)
cbpt about results for her Head CT. I explained since Dr. Felicity Coyer ordered that office has to give the results, so I gave the pt the # for primary care, pt said thank you very much for my help. told her she was welcome

## 2012-05-29 ENCOUNTER — Ambulatory Visit: Payer: Medicare Other | Admitting: Physician Assistant

## 2012-06-18 ENCOUNTER — Encounter: Payer: Self-pay | Admitting: Physician Assistant

## 2012-06-18 ENCOUNTER — Ambulatory Visit (INDEPENDENT_AMBULATORY_CARE_PROVIDER_SITE_OTHER): Payer: Medicare Other | Admitting: Physician Assistant

## 2012-06-18 VITALS — BP 126/82 | HR 87 | Ht 64.0 in | Wt 130.1 lb

## 2012-06-18 DIAGNOSIS — Z9181 History of falling: Secondary | ICD-10-CM | POA: Diagnosis not present

## 2012-06-18 DIAGNOSIS — I10 Essential (primary) hypertension: Secondary | ICD-10-CM

## 2012-06-18 DIAGNOSIS — R0602 Shortness of breath: Secondary | ICD-10-CM | POA: Diagnosis not present

## 2012-06-18 DIAGNOSIS — I359 Nonrheumatic aortic valve disorder, unspecified: Secondary | ICD-10-CM | POA: Diagnosis not present

## 2012-06-18 DIAGNOSIS — R296 Repeated falls: Secondary | ICD-10-CM

## 2012-06-18 NOTE — Patient Instructions (Addendum)
Your physician wants you to follow-up in: 6 MONTHS WITH DR. HOCHREIN. You will receive a reminder letter in the mail two months in advance. If you don't receive a letter, please call our office to schedule the follow-up appointment.   NO CHANGES WERE MADE TODAY 

## 2012-06-18 NOTE — Progress Notes (Signed)
[address] [place], [place]  [address] Phone: [phone] Fax:  [phone]  Date:  06/18/2012   ID:  [name], DOB January 19, 1920, MRN [id number]  PCP:  Illene Regulus, MD  Primary Cardiologist:  Dr. Rollene Rotunda     History of Present Illness: [name] is a 79 y.o. female who returns for f/u on high BP.  She has a hx of severe aortic regurgitation and nonischemic cardiomyopathy with an EF of 25-30%. She underwent bioprosthetic aortic valve replacement and replacement of her ascending aorta by Dr. Tyrone Sage October 2007.  This was complicated by pericardial and pleural effusion. She had a posterior pericardial window placed by a VATS procedure. Subsequently her EF has normalized to 55%. She has been plagued by chronic dyspnea in the past.  She had a TIA in October 2010 with some double vision. Last echo 03/2010: inferobasal HK, severe LVH, EF 55-60%, dynamic obstruction during Valsalva in mid cavity, peak velocity 232 cm/sec, peak gradient 22 mmHg, AVR ok, mod dilated aorta, mild LAE.  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   I saw her last month with elevated blood pressures. We asked her to decrease her salt intake. Her PCP had placed her back on her ACE inhibitor.  Since last seen, she has fallen. She had a scalp laceration. Head CT was negative for bleed. Rib x-ray was negative for fracture. She continues to note dyspnea with exertion. She continues to bowl once a week without significant dyspnea. She describes NYHA class II-IIb symptoms. She denies orthopnea or PND. She has mild pedal edema without change. She denies chest pain. She denies syncope.  Labs (12/13):  K 4, creatinine 1.3, Hgb 12.9 Labs (4/14):    K 4.4, Cr 1.1, TSH 0.61  Wt Readings from Last 3 Encounters:  06/18/12 130 lb 1.9 oz (59.022 kg)  05/15/12 131 lb 1.9 oz (59.476 kg)  05/02/12 136 lb (61.689 kg)     Past Medical  History  Diagnosis Date  . Nonischemic cardiomyopathy 2007 / Feb 2009    Miminial non-obs CAD cath (2007); EF previously 25%;  55% by echo 2012;  Echo 4/14:  mild LVH; mod asymmetric hypertrophy of the septum, EF 55-60%, Gr 1 DD, AVR ok (mean gradient 9), MAC, mild LAE, PASP 31  . Aortic insufficiency October 2007    Severe with ascending thoracic aneurysm; s/p pericardial tissue AVR and Heamsheild graft in October 2007; c/b pericardial effusion requiring window;  echo 03/2010: inferobasal HK, severe LVH, EF 55-60%, dynamic obstruction during Valsalva in mid cavity, peak velocity 232 cm/sec, peak gradient 22 mmHg, AVR ok, mod dilated aorta, mild LAE  . Hyperlipidemia   . Hypertension   . GERD (gastroesophageal reflux disease)   . History of cholelithiasis     Gallbladder dysfunction  . History of non anemic vitamin B12 deficiency   . Allergic rhinitis   . TIA (transient ischemic attack) October 2010  . Brain cancer   . CNS lymphoma     Current Outpatient Prescriptions  Medication Sig Dispense Refill  . buPROPion (WELLBUTRIN XL) 150 MG 24 hr tablet Take 150 mg by mouth daily.      . fluticasone (FLONASE) 50 MCG/ACT nasal spray Place 2 sprays into the nose daily.  16 g  6  . folic acid (FOLVITE) 1 MG tablet TAKE 1 TABLET ONCE DAILY.  30 tablet  5  . furosemide (LASIX) 20 MG tablet  Take 20 mg by mouth daily as needed.      . loratadine (CLARITIN) 10 MG tablet Take 1 tablet (10 mg total) by mouth daily.      . ramipril (ALTACE) 2.5 MG capsule Take 1 capsule (2.5 mg total) by mouth daily.  30 capsule  3  . TOPROL XL 50 MG 24 hr tablet TAKE 1 TABLET ONCE DAILY.  30 tablet  2   No current facility-administered medications for this visit.    Allergies:   No Known Allergies  Social History:  The patient  reports that she has never smoked. She has never used smokeless tobacco. She reports that she does not drink alcohol or use illicit drugs.   ROS:  Please see the history of present illness.    She denies wheezing or significant cough.   All other systems reviewed and negative.   PHYSICAL EXAM: VS:  BP 126/82  Pulse 87  Ht 5\' 4"  (1.626 m)  Wt 130 lb 1.9 oz (59.022 kg)  BMI 22.32 kg/m2  Well nourished, well developed, in no acute distress HEENT: normal Neck: no JVD Cardiac:  normal S1, S2; + S4, RRR; short 1/6 systolic murmur along the LSB Lungs:  clear to auscultation bilaterally, no wheezing, rhonchi or rales Abd: soft, nontender, no hepatomegaly Ext: trace bilateral ankle edema Skin: warm and dry Neuro:  CNs 2-12 intact, no focal abnormalities noted  EKG:  NSR, HR 87, normal axis, inferolateral T wave inversions, PVCs, no change from prior tracing     ASSESSMENT AND PLAN:  1. Hypertension:  Better controlled. Continue current therapy. 2. Aortic Insufficiency, Status Post Tissue AVR:   Recent echocardiogram with stable aortic valve function. 3. Dyspnea: This is a chronic symptom. It appears to be stable. 4. Frequent Falls:  I have recommended that she consider referral to physical therapy for balance. She is not sure she can arrange transportation. She can certainly arrange this with her PCP or me if she desires.  5. CNS Lymphoma: Followup with oncology as scheduled 6. Disposition:  Follow up with Dr. Antoine Poche in 6 months.  Signed, Tereso Newcomer, PA-C  10:40 AM 06/18/2012

## 2012-06-20 ENCOUNTER — Telehealth: Payer: Self-pay | Admitting: Oncology

## 2012-06-20 ENCOUNTER — Ambulatory Visit: Payer: Medicare Other | Admitting: Oncology

## 2012-06-20 NOTE — Telephone Encounter (Signed)
pt called and wants a ride , she will call back if she can get a ride she will come,nurse notified

## 2012-06-21 ENCOUNTER — Other Ambulatory Visit: Payer: Self-pay

## 2012-06-21 MED ORDER — FOLIC ACID 1 MG PO TABS: 1.0000 mg | ORAL_TABLET | Freq: Every day | ORAL | Status: DC

## 2012-06-24 ENCOUNTER — Other Ambulatory Visit: Payer: Self-pay

## 2012-06-24 ENCOUNTER — Other Ambulatory Visit: Payer: Self-pay | Admitting: Cardiology

## 2012-06-24 MED ORDER — METOPROLOL SUCCINATE ER 50 MG PO TB24: ORAL_TABLET | ORAL | Status: DC

## 2012-06-24 NOTE — Telephone Encounter (Signed)
..   Requested Prescriptions   Pending Prescriptions Disp Refills  . ramipril (ALTACE) 2.5 MG capsule [Pharmacy Med Name: RAMIPRIL 2.5 MG CAPSULE] 30 capsule 10    Sig: TAKE (1) CAPSULE DAILY.

## 2012-06-24 NOTE — Telephone Encounter (Signed)
..   Requested Prescriptions   Signed Prescriptions Disp Refills  . metoprolol succinate (TOPROL XL) 50 MG 24 hr tablet 30 tablet 2    Sig: TAKE 1 TABLET ONCE DAILY.    Authorizing Provider: Rollene Rotunda    Ordering User: Christella Hartigan, Toshie Demelo Judie Petit

## 2012-07-02 ENCOUNTER — Telehealth: Payer: Self-pay | Admitting: Oncology

## 2012-07-02 NOTE — Telephone Encounter (Signed)
Pt's daughter called and gavher appt for July 2014

## 2012-07-25 ENCOUNTER — Telehealth: Payer: Self-pay | Admitting: Oncology

## 2012-07-25 ENCOUNTER — Other Ambulatory Visit: Payer: Self-pay | Admitting: Dermatology

## 2012-07-25 DIAGNOSIS — L821 Other seborrheic keratosis: Secondary | ICD-10-CM | POA: Diagnosis not present

## 2012-07-25 DIAGNOSIS — L280 Lichen simplex chronicus: Secondary | ICD-10-CM | POA: Diagnosis not present

## 2012-07-25 DIAGNOSIS — C44621 Squamous cell carcinoma of skin of unspecified upper limb, including shoulder: Secondary | ICD-10-CM | POA: Diagnosis not present

## 2012-07-25 DIAGNOSIS — D485 Neoplasm of uncertain behavior of skin: Secondary | ICD-10-CM | POA: Diagnosis not present

## 2012-07-25 NOTE — Telephone Encounter (Signed)
Called pt and leftn message regarding appt r/s from 7/11 to 7/21

## 2012-07-29 ENCOUNTER — Telehealth: Payer: Self-pay | Admitting: *Deleted

## 2012-07-29 NOTE — Telephone Encounter (Signed)
Notified daughter, that appointment can't be moved since MD is not in the office. She wants to ensure that she is called for all appointments and not the patient-she gets confused about appointments and forgets them.

## 2012-07-29 NOTE — Telephone Encounter (Signed)
Left VM asking if appointment for 08/02/12 can be kept please because patient's son was going to come in for appointment?

## 2012-08-02 ENCOUNTER — Ambulatory Visit: Payer: Medicare Other | Admitting: Oncology

## 2012-08-06 ENCOUNTER — Other Ambulatory Visit: Payer: Self-pay

## 2012-08-06 DIAGNOSIS — C8599 Non-Hodgkin lymphoma, unspecified, extranodal and solid organ sites: Secondary | ICD-10-CM

## 2012-08-06 MED ORDER — FUROSEMIDE 20 MG PO TABS: 20.0000 mg | ORAL_TABLET | Freq: Every day | ORAL | Status: DC | PRN

## 2012-08-12 ENCOUNTER — Ambulatory Visit (HOSPITAL_BASED_OUTPATIENT_CLINIC_OR_DEPARTMENT_OTHER): Payer: Medicare Other | Admitting: Oncology

## 2012-08-12 ENCOUNTER — Telehealth: Payer: Self-pay | Admitting: Oncology

## 2012-08-12 VITALS — BP 163/79 | HR 66 | Temp 97.0°F | Resp 18 | Ht 64.0 in | Wt 131.9 lb

## 2012-08-12 DIAGNOSIS — C8589 Other specified types of non-Hodgkin lymphoma, extranodal and solid organ sites: Secondary | ICD-10-CM | POA: Diagnosis not present

## 2012-08-12 DIAGNOSIS — C8599 Non-Hodgkin lymphoma, unspecified, extranodal and solid organ sites: Secondary | ICD-10-CM

## 2012-08-12 DIAGNOSIS — N189 Chronic kidney disease, unspecified: Secondary | ICD-10-CM

## 2012-08-12 DIAGNOSIS — I10 Essential (primary) hypertension: Secondary | ICD-10-CM | POA: Diagnosis not present

## 2012-08-12 NOTE — Progress Notes (Signed)
   [facility]    OFFICE PROGRESS NOTE   INTERVAL HISTORY:   She returns as scheduled. She continues to complain of malaise. She bowls 1 date per week. She had a transient episode of "double vision "lasting for less than 5 minutes on 08/10/2012. The double vision spontaneously resolved. The balance difficulty with walking persists. She fell in her garage in April. A brain CT revealed a small scalp hematoma and no new intracranial finding. She did not lose consciousness and did not go to emergency room. Good appetite.  Objective:  Vital signs in last 24 hours:  Blood pressure 163/79, pulse 66, temperature 97 F (36.1 C), temperature source Oral, resp. rate 18, height 5\' 4"  (1.626 m), weight 131 lb 14.4 oz (59.829 kg).    HEENT: Neck without Lymphatics: No cervical or supraclavicular nodes. "Shotty "bilateral axillary nodes. Resp: Lungs clear bilaterally Cardio: Regular rate and rhythm GI: No hepatosplenomegaly Vascular: No leg edema Neuro: Alert and oriented, the motor exam appears intact in the upper and lower extremities. Finger to nose testing is normal. She ambulates with an unsteady gait.     Medications: I have reviewed the patient's current medications.  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  a. Status post surgical resection at [facility] 07/20/2010 with the pathology confirming a diffuse large B-cell lymphoma.  b. Initiation of weekly rituximab therapy 08/31/2010, status post week number 4 on 09/23/2010. c. Restaging MRI of the brain 10/07/2010 without evidence of disease progression. d. Initiation of every 2 month "maintenance" rituximab on 11/16/2010. Last treated on 09/15/2011 e. Restaging MRI of the brain 02/06/2011 unchanged aside from a slight increase in altered T2 signal in the subcortical region at the anterior left temporal lobe. f. Restaging MRI of  the brain 07/07/2011-minimal change in appearance of T2 hyperintensity involving the anterior left temporal lobe g. Restaging MRI of the brain 01/15/2012-no significant change to suggest progression of tumor given limits of the unenhanced study 2. Gait ataxia and memory deficit, question related to the central nervous system lymphoma. She appears stable today. 3. Hypertension. 4. Nonischemic cardiomyopathy. 5. Status post aortic valve replacement. 6. Transient ischemic attack in 2010. 7. Chronic renal insufficiency  Disposition:  Her overall status appears unchanged. I have a low clinical suspicion for progression of the CNS lymphoma. She will contact us for new neurologic symptoms. Ms. Gasaway will return for an office visit in 3 months.   Thornton Papas, MD  08/12/2012  11:27 AM

## 2012-08-12 NOTE — Telephone Encounter (Signed)
gv and printed appt sched and avs for pt  °

## 2012-08-28 ENCOUNTER — Other Ambulatory Visit: Payer: Self-pay

## 2012-10-07 ENCOUNTER — Telehealth: Payer: Self-pay | Admitting: *Deleted

## 2012-10-07 NOTE — Telephone Encounter (Signed)
Message from pt reporting dizziness today. Returned call, she reports this has been going on intermittently, noticed it was worse today. Denies nausea or vision changes. States her balance is a little off, but this has been ongoing as well. Reports she is eating and drinking OK. Suggested she make PCP aware. Lives alone, but has had someone with her today.  Info forwarded to Dr. Truett Perna.

## 2012-10-08 NOTE — Telephone Encounter (Signed)
Late entry for 9/15 1740: Phone call reviewed with Dr. Truett Perna, follow up as scheduled. call if this worsens.

## 2012-10-14 ENCOUNTER — Other Ambulatory Visit (INDEPENDENT_AMBULATORY_CARE_PROVIDER_SITE_OTHER): Payer: Medicare Other

## 2012-10-14 ENCOUNTER — Ambulatory Visit: Payer: Medicare Other | Admitting: Internal Medicine

## 2012-10-14 ENCOUNTER — Encounter: Payer: Self-pay | Admitting: Internal Medicine

## 2012-10-14 ENCOUNTER — Ambulatory Visit (INDEPENDENT_AMBULATORY_CARE_PROVIDER_SITE_OTHER): Payer: Medicare Other | Admitting: Internal Medicine

## 2012-10-14 VITALS — BP 140/88 | HR 61 | Wt 136.6 lb

## 2012-10-14 DIAGNOSIS — J310 Chronic rhinitis: Secondary | ICD-10-CM

## 2012-10-14 DIAGNOSIS — I5032 Chronic diastolic (congestive) heart failure: Secondary | ICD-10-CM

## 2012-10-14 DIAGNOSIS — F329 Major depressive disorder, single episode, unspecified: Secondary | ICD-10-CM

## 2012-10-14 DIAGNOSIS — C8599 Non-Hodgkin lymphoma, unspecified, extranodal and solid organ sites: Secondary | ICD-10-CM

## 2012-10-14 DIAGNOSIS — I10 Essential (primary) hypertension: Secondary | ICD-10-CM

## 2012-10-14 DIAGNOSIS — E785 Hyperlipidemia, unspecified: Secondary | ICD-10-CM

## 2012-10-14 DIAGNOSIS — Z23 Encounter for immunization: Secondary | ICD-10-CM | POA: Diagnosis not present

## 2012-10-14 DIAGNOSIS — M1990 Unspecified osteoarthritis, unspecified site: Secondary | ICD-10-CM

## 2012-10-14 DIAGNOSIS — C8589 Other specified types of non-Hodgkin lymphoma, extranodal and solid organ sites: Secondary | ICD-10-CM

## 2012-10-14 LAB — LIPID PANEL
HDL: 38.4 mg/dL — ABNORMAL LOW (ref >39.00)
LDL Cholesterol: 104 mg/dL — ABNORMAL HIGH (ref 0–99)
Total CHOL/HDL Ratio: 4
VLDL: 24 mg/dL (ref 0.0–40.0)

## 2012-10-14 LAB — HEPATIC FUNCTION PANEL
Alkaline Phosphatase: 73 U/L (ref 39–117)
Bilirubin, Direct: 0.2 mg/dL (ref 0.0–0.3)
Total Bilirubin: 1.2 mg/dL (ref 0.3–1.2)
Total Protein: 7.1 g/dL (ref 6.0–8.3)

## 2012-10-14 NOTE — Patient Instructions (Addendum)
Discussed pneumonia immunizations: encouraged Prevnar today, return for 23 polysaccharide in 6+ weeks.   Derm - was seen by Dr. Karlyn Agee - seborrheic keratosis, lichenficiation, skin lesions of uncertain behavior that were excised.  She was given a lotion for her scalp - steroid??  OA - continued hand pain. She has tried hand gloves.  Sinus drainage with a lump in the throat: rec - nasal saline, mucinex 600 mg bid.  Dizziness - sounds more like dysequilibrium rather than true vertigo. Rec - meclizine 12.5 q 6  Paresthesia - finger tips both hands. Seems to be getting worse. It interferes with knitting but she is still able to bowl.  Fluid accumulation and shortness of breath: has seen cardiology May '14: plan is to use lasix as needed: "rule of 2's"

## 2012-10-14 NOTE — Progress Notes (Signed)
Subjective:    Patient ID: [name], female    DOB: [birth year], 79 y.o.   MRN: [id number]  HPI [name] presents for routine follow-up. Discussed pneumonia immunizations: encouraged Prevnar today, return for 23 polysaccharide in 6+ weeks.   Derm - was seen by Dr. Karlyn Agee - seborrheic keratosis, lichenficiation, skin lesions of uncertain behavior that were excised.  She was given a lotion for her scalp - steroid??  OA - continued hand pain. She has tried hand gloves.  Sinus drainage with a lump in the throat: rec - nasal saline, mucinex 600 mg bid.  Dizziness - sounds more like dysequilibrium rather than true vertigo. Rec - meclizine 12.5 q 6  Paresthesia - finger tips both hands. Seems to be getting worse. It interferes with knitting but she is still able to bowl.  Fluid accumulation and shortness of breath: has seen cardiology May '14: plan is to use lasix as needed: "rule of 2's"  Past Medical History  Diagnosis Date  . Nonischemic cardiomyopathy 2007 / Feb 2009    Miminial non-obs CAD cath (2007); EF previously 25%;  55% by echo 2012;  Echo 4/14:  mild LVH; mod asymmetric hypertrophy of the septum, EF 55-60%, Gr 1 DD, AVR ok (mean gradient 9), MAC, mild LAE, PASP 31  . Aortic insufficiency October 2007    Severe with ascending thoracic aneurysm; s/p pericardial tissue AVR and Heamsheild graft in October 2007; c/b pericardial effusion requiring window;  echo 03/2010: inferobasal HK, severe LVH, EF 55-60%, dynamic obstruction during Valsalva in mid cavity, peak velocity 232 cm/sec, peak gradient 22 mmHg, AVR ok, mod dilated aorta, mild LAE  . Hyperlipidemia   . Hypertension   . GERD (gastroesophageal reflux disease)   . History of cholelithiasis     Gallbladder dysfunction  . History of non anemic vitamin B12 deficiency   . Allergic rhinitis   . TIA (transient ischemic attack) October 2010  . Brain cancer   . CNS lymphoma    Past Surgical History  Procedure  Laterality Date  . Tonsillectomy    . Aortic valve replacement  2007  . Bilateral vein stripping    . Left temporal tumor resected     Family History  Problem Relation Age of Onset  . Stroke Father   . Arthritis Other   . Coronary artery disease Other     Female 1st degree relative <50  . Diabetes Other   . Cerebral aneurysm Son    History   Social History  . Marital Status: Widowed    Spouse Name: N/A    Number of Children: N/A  . Years of Education: N/A   Occupational History  . Not on file.   Social History Main Topics  . Smoking status: Never Smoker   . Smokeless tobacco: Never Used  . Alcohol Use: No  . Drug Use: No  . Sexual Activity: Not on file   Other Topics Concern  . Not on file   Social History Narrative   HSG   Married @ 1942, 65 years/ widowed 2007   Lives alone in [place]   She was a housewife   No h/o abuse   End-of-Life: No CPR; DNI; no prolonged tube feeding.  Provided signed "Out of Facility Order" and completed and signed MOST form.  These should accompany her to hospital, etc.  She should share her wished with the family (04/01/10)          Current Outpatient Prescriptions on File  Prior to Visit  Medication Sig Dispense Refill  . fluticasone (FLONASE) 50 MCG/ACT nasal spray Place 2 sprays into the nose daily.  16 g  6  . folic acid (FOLVITE) 1 MG tablet Take 1 tablet (1 mg total) by mouth daily.  30 tablet  5  . furosemide (LASIX) 20 MG tablet Take 1 tablet (20 mg total) by mouth daily as needed.  30 tablet  6  . loratadine (CLARITIN) 10 MG tablet Take 1 tablet (10 mg total) by mouth daily.      . metoprolol succinate (TOPROL XL) 50 MG 24 hr tablet TAKE 1 TABLET ONCE DAILY.  30 tablet  2  . ramipril (ALTACE) 2.5 MG capsule TAKE (1) CAPSULE DAILY.  30 capsule  10  . simvastatin (ZOCOR) 40 MG tablet Take 40 mg by mouth every evening.       No current facility-administered medications on file prior to visit.     Review of Systems System  review is negative for any constitutional, cardiac, pulmonary, GI or neuro symptoms or complaints other than as described in the HPI.     Objective:   Physical Exam Filed Vitals:   10/14/12 1038  BP: 140/88  Pulse: 61   gen'l- Very young looking 79 y/o in no distress HEENT - [place]/AT, C&S clear, PERRLA Cor 2+ radial pulse, regular Pulm - normal respirations, Lungs - CTAP Derm- erythematous scaly patch of skin at posterior skull/upper neck Neuro - A&O x3, Speech clear, CN II-XII normal facial symmetry, normal gait.       Assessment & Plan:

## 2012-10-15 NOTE — Assessment & Plan Note (Signed)
Appears to be stable - highly functional (goes bowling!!) and remains independent. Balance and gait problems persist. Last visit to Dr. Truett Perna July 21, '14 - appeared stable.  Plan F/u with Dr. Truett Perna as scheduled  May try meclizine 12.5 mg q 6 as needed for dysequilibrium - it may help

## 2012-10-15 NOTE — Assessment & Plan Note (Signed)
Continues to take statin therapy.   Plan Due for follow up lab  Addendum - normal LFTs, good control of lipids

## 2012-10-15 NOTE — Assessment & Plan Note (Signed)
No signs of decompensation. She has seen Tereso Newcomer recently and was deemed stable.  Plan Continue present medical regimen

## 2012-10-15 NOTE — Assessment & Plan Note (Signed)
She does c/o post nasal drainage and inspissated mucus.  Plan Encouraged her to use Flonase nasal spray  Recommended mucinex 600 mg bid for inspissated secretions

## 2012-10-15 NOTE — Assessment & Plan Note (Signed)
BP Readings from Last 3 Encounters:  10/14/12 140/88  08/12/12 163/79  06/18/12 126/82   Some variability in BP but generally within range of acceptable control. Recent Bmet normal  Plan Continue present medications

## 2012-10-15 NOTE — Assessment & Plan Note (Signed)
Appears to be in good spirits. Son is with her and has no report of any behavioral or emotional issues.

## 2012-10-15 NOTE — Assessment & Plan Note (Signed)
Continued stiffness and discomfort both hands. Her hands do stay cold.  Plan Continue with warming gloves, especially over night.

## 2012-11-11 ENCOUNTER — Ambulatory Visit (HOSPITAL_BASED_OUTPATIENT_CLINIC_OR_DEPARTMENT_OTHER): Payer: Medicare Other | Admitting: Oncology

## 2012-11-11 ENCOUNTER — Telehealth: Payer: Self-pay | Admitting: Oncology

## 2012-11-11 VITALS — BP 135/74 | HR 75 | Temp 97.3°F | Resp 18 | Ht 64.0 in | Wt 132.9 lb

## 2012-11-11 DIAGNOSIS — C8581 Other specified types of non-Hodgkin lymphoma, lymph nodes of head, face, and neck: Secondary | ICD-10-CM | POA: Diagnosis not present

## 2012-11-11 DIAGNOSIS — I10 Essential (primary) hypertension: Secondary | ICD-10-CM | POA: Diagnosis not present

## 2012-11-11 DIAGNOSIS — N189 Chronic kidney disease, unspecified: Secondary | ICD-10-CM | POA: Diagnosis not present

## 2012-11-11 DIAGNOSIS — R0609 Other forms of dyspnea: Secondary | ICD-10-CM

## 2012-11-11 DIAGNOSIS — C8599 Non-Hodgkin lymphoma, unspecified, extranodal and solid organ sites: Secondary | ICD-10-CM

## 2012-11-11 NOTE — Telephone Encounter (Signed)
gv and printed appt sched and avs foro pt for Jan 2015

## 2012-11-11 NOTE — Progress Notes (Signed)
   [facility]    OFFICE PROGRESS NOTE   INTERVAL HISTORY:   Ms. Prestwood returns for scheduled followup of CNS lymphoma. She reports a mild left frontal headache. Her chief complaint is dyspnea on exertion. She continues to bowl weekly. No new neurologic symptoms. Stable ataxia. She complains of malaise.  Objective:  Vital signs in last 24 hours:  Blood pressure 135/74, pulse 75, temperature 97.3 F (36.3 C), temperature source Oral, resp. rate 18, height 5\' 4"  (1.626 m), weight 132 lb 14.4 oz (60.283 kg).    HEENT: Neck without mass Lymphatics: No cervical, supraclavicular, or axillary nodes Resp: Lungs with end inspiratory rhonchi at the left posterior base, no respiratory distress Cardio: Regular rate and rhythm,? Split S1 GI: No hepatosplenomegaly Vascular: No leg edema Neuro: Alert, oriented to month and year. Finger to nose testing is normal. The motor exam appears intact in the upper and lower extremities. Slight gait ataxia     Medications: I have reviewed the patient's current medications.  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  a. 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  b. Initiation of weekly rituximab therapy 08/31/2010, status post week number 4 on 09/23/2010. c. Restaging MRI of the brain 10/07/2010 without evidence of disease progression. d. Initiation of every 2 month "maintenance" rituximab on 11/16/2010. Last treated on 09/15/2011 e. Restaging MRI of the brain 02/06/2011 unchanged aside from a slight increase in altered T2 signal in the subcortical region at the anterior left temporal lobe. f. Restaging MRI of the brain 07/07/2011-minimal change in appearance of T2 hyperintensity involving the anterior left temporal lobe g. Restaging MRI of the brain 01/15/2012-no  significant change to suggest progression of tumor given limits of the unenhanced study 2. Gait ataxia and memory deficit, question related to the central nervous system lymphoma. Stable 3. Hypertension. 4. Nonischemic cardiomyopathy. 5. Status post aortic valve replacement. 6. Transient ischemic attack in 2010. 7. Chronic renal insufficiency 8. Chronic exertional dyspnea-likely related to the cardiomyopathy   Disposition:  There is no clinical evidence for progression of the CNS lymphoma. She last had an MRI of the brain in December of 2013. We will order another MRI if she develops new symptoms. Ms. Antonacci will followup with Dr. Debby Bud to evaluate the dyspnea. She will return for an office visit in 3 months. I recommended she ambulate with a walker.   Thornton Papas, MD  11/11/2012  4:16 PM

## 2012-11-11 NOTE — Progress Notes (Signed)
Patient does not recall what medications she is taking. Does know it is October, but unable to name who president is currently. Poor short-term memory. She lives alone and has emergency-call system/life alert she wears around her neck that was used on prior occasion when she burned hot dog on her stove by letting all the water evaporate.

## 2012-11-14 ENCOUNTER — Other Ambulatory Visit: Payer: Self-pay

## 2012-11-14 MED ORDER — METOPROLOL SUCCINATE ER 50 MG PO TB24: ORAL_TABLET | ORAL | Status: DC

## 2012-12-16 ENCOUNTER — Ambulatory Visit (INDEPENDENT_AMBULATORY_CARE_PROVIDER_SITE_OTHER): Payer: Medicare Other | Admitting: Cardiology

## 2012-12-16 ENCOUNTER — Encounter: Payer: Self-pay | Admitting: Cardiology

## 2012-12-16 VITALS — BP 110/72 | Ht 64.0 in | Wt 132.0 lb

## 2012-12-16 DIAGNOSIS — I359 Nonrheumatic aortic valve disorder, unspecified: Secondary | ICD-10-CM

## 2012-12-16 MED ORDER — SIMVASTATIN 20 MG PO TABS: 20.0000 mg | ORAL_TABLET | Freq: Every evening | ORAL | Status: DC

## 2012-12-16 NOTE — Patient Instructions (Addendum)
Decrease Zocor to 20 mg a day. Continue all other medications as listed.  Follow up in 1 year with Dr Antoine Poche.  You will receive a letter in the mail 2 months before you are due.  Please call us when you receive this letter to schedule your follow up appointment.

## 2012-12-16 NOTE — Progress Notes (Signed)
HPI The patient presents for evaluation of shortness of breath. She has a history of AVR in 2007.  She had a cardiomyopathy at that time with an EF of 25%. She had an echo earlier this year. Her EF was well-preserved and her aortic valve replacement pericardial valve looked normal.  She presents for followup. She has no acute complaints although she continues to have dyspnea with some activities such as carrying groceries. She complains of fatigue. She is still able to bowl however. She's not describing PND or orthopnea. She's not describing new palpitations, presyncope or syncope. She's not having any chest pressure, neck or arm discomfort.   No Known Allergies  Current Outpatient Prescriptions  Medication Sig Dispense Refill  . fluticasone (FLONASE) 50 MCG/ACT nasal spray Place 2 sprays into the nose daily.  16 g  6  . folic acid (FOLVITE) 1 MG tablet Take 1 tablet (1 mg total) by mouth daily.  30 tablet  5  . furosemide (LASIX) 20 MG tablet Take 1 tablet (20 mg total) by mouth daily as needed.  30 tablet  6  . loratadine (CLARITIN) 10 MG tablet Take 1 tablet (10 mg total) by mouth daily.      . metoprolol succinate (TOPROL XL) 50 MG 24 hr tablet TAKE 1 TABLET ONCE DAILY.  30 tablet  2  . ramipril (ALTACE) 2.5 MG capsule TAKE (1) CAPSULE DAILY.  30 capsule  10  . simvastatin (ZOCOR) 40 MG tablet Take 40 mg by mouth every evening.       No current facility-administered medications for this visit.    Past Medical History  Diagnosis Date  . Nonischemic cardiomyopathy 2007 / Feb 2009    Miminial non-obs CAD cath (2007); EF previously 25%;  55% by echo 2012;  Echo 4/14:  mild LVH; mod asymmetric hypertrophy of the septum, EF 55-60%, Gr 1 DD, AVR ok (mean gradient 9), MAC, mild LAE, PASP 31  . Aortic insufficiency October 2007    Severe with ascending thoracic aneurysm; s/p pericardial tissue AVR and Heamsheild graft in October 2007; c/b pericardial effusion requiring window;  echo 03/2010:  inferobasal HK, severe LVH, EF 55-60%, dynamic obstruction during Valsalva in mid cavity, peak velocity 232 cm/sec, peak gradient 22 mmHg, AVR ok, mod dilated aorta, mild LAE  . Hyperlipidemia   . Hypertension   . GERD (gastroesophageal reflux disease)   . History of cholelithiasis     Gallbladder dysfunction  . History of non anemic vitamin B12 deficiency   . Allergic rhinitis   . TIA (transient ischemic attack) October 2010  . Brain cancer   . CNS lymphoma     Past Surgical History  Procedure Laterality Date  . Tonsillectomy    . Aortic valve replacement  2007  . Bilateral vein stripping    . Left temporal tumor resected     ROS: A stated in the HPI and negative for all other systems.  PHYSICAL EXAM BP 110/72  Ht 5\' 4"  (1.626 m)  Wt 132 lb (59.875 kg)  BMI 22.65 kg/m2 GENERAL:  Well appearing, looks younger than stated age NECK:  No jugular venous distention, waveform within normal limits, carotid upstroke brisk and symmetric, no bruits, no thyromegaly LYMPHATICS:  No cervical, inguinal adenopathy LUNGS:  Clear to auscultation bilaterally BACK:  No CVA tenderness CHEST:  Well healed sternotomy scar. HEART:  PMI not displaced or sustained,S1 and S2 within normal limits, no S3, no S4, no clicks, no rubs, no murmurs ABD:  Flat, positive bowel sounds normal in frequency in pitch, no bruits, no rebound, no guarding, no midline pulsatile mass, no hepatomegaly, no splenomegaly EXT:  2 plus pulses throughout, no edema, no cyanosis no clubbing  ASSESSMENT AND PLAN  AVR:  She had a normally functioning valve this year. No further imaging is indicated. No change in therapy.  FATIGUE:  I suspect that this is multifactorial.  She will follow up with Dr. Debby Bud to discuss further.  I do not see a reversible etiology.  DYSPNEA:  This has been a long standing complaint.  There might be some mild diastolic dysfunction but I do not think this contributes.  I suspect that this is related to  normal muscle/strength loss with aging.  I think that it would be reasonable to reduce the statin as the benefit with age is questionable while the potential for side effects increases.

## 2012-12-17 ENCOUNTER — Ambulatory Visit: Payer: Medicare Other | Admitting: Cardiology

## 2013-02-17 ENCOUNTER — Telehealth: Payer: Self-pay | Admitting: Oncology

## 2013-02-17 ENCOUNTER — Ambulatory Visit: Payer: Medicare Other | Admitting: Oncology

## 2013-02-17 NOTE — Telephone Encounter (Signed)
Pt called and r/s md visit to [facility], first opening for MD , nurse notified

## 2013-02-19 ENCOUNTER — Telehealth: Payer: Self-pay | Admitting: Oncology

## 2013-02-19 NOTE — Telephone Encounter (Signed)
Talked to pt and gave her appt for 02/24/13

## 2013-02-24 ENCOUNTER — Ambulatory Visit (HOSPITAL_BASED_OUTPATIENT_CLINIC_OR_DEPARTMENT_OTHER): Payer: Medicare Other | Admitting: Oncology

## 2013-02-24 ENCOUNTER — Telehealth: Payer: Self-pay | Admitting: Oncology

## 2013-02-24 ENCOUNTER — Ambulatory Visit (HOSPITAL_BASED_OUTPATIENT_CLINIC_OR_DEPARTMENT_OTHER): Payer: Medicare Other

## 2013-02-24 VITALS — BP 145/77 | HR 64 | Temp 96.7°F | Resp 18 | Ht 64.0 in | Wt 133.0 lb

## 2013-02-24 DIAGNOSIS — I10 Essential (primary) hypertension: Secondary | ICD-10-CM | POA: Diagnosis not present

## 2013-02-24 DIAGNOSIS — R21 Rash and other nonspecific skin eruption: Secondary | ICD-10-CM

## 2013-02-24 DIAGNOSIS — C8589 Other specified types of non-Hodgkin lymphoma, extranodal and solid organ sites: Secondary | ICD-10-CM

## 2013-02-24 DIAGNOSIS — C8599 Non-Hodgkin lymphoma, unspecified, extranodal and solid organ sites: Secondary | ICD-10-CM

## 2013-02-24 DIAGNOSIS — C8339 Primary central nervous system lymphoma: Secondary | ICD-10-CM

## 2013-02-24 DIAGNOSIS — R269 Unspecified abnormalities of gait and mobility: Secondary | ICD-10-CM

## 2013-02-24 DIAGNOSIS — N189 Chronic kidney disease, unspecified: Secondary | ICD-10-CM

## 2013-02-24 LAB — BASIC METABOLIC PANEL (CC13)
Anion Gap: 11 mEq/L (ref 3–11)
BUN: 28.9 mg/dL — AB (ref 7.0–26.0)
CHLORIDE: 102 meq/L (ref 98–109)
CO2: 26 meq/L (ref 22–29)
CREATININE: 1.2 mg/dL — AB (ref 0.6–1.1)
Calcium: 9.9 mg/dL (ref 8.4–10.4)
GLUCOSE: 98 mg/dL (ref 70–140)
POTASSIUM: 4.7 meq/L (ref 3.5–5.1)
Sodium: 139 mEq/L (ref 136–145)

## 2013-02-24 NOTE — Telephone Encounter (Signed)
Gave pt appt for ML on February 2015 , pt sent to labs today

## 2013-02-24 NOTE — Progress Notes (Signed)
   [name]    OFFICE PROGRESS NOTE   INTERVAL HISTORY:   She returns for scheduled followup of CNS lymphoma. [name] reports an episode of dizziness while on the phone with her daughter last week. She has headaches and intermittent nausea. She continues to bowl weekly. The gait remains ataxic. She lives alone. She is not driving. She complains of pruritus and a rash at the posterior neck.  Objective:  Vital signs in last 24 hours:  Blood pressure 145/77, pulse 64, temperature 96.7 F (35.9 C), temperature source Oral, resp. rate 18, height 5\' 4"  (1.626 m), weight 133 lb (60.328 kg).    HEENT: Neck without mass Lymphatics: No cervical or supraclavicular nodes Resp: Lungs clear bilaterally Cardio: Regular rate and rhythm GI: No hepatosplenomegaly, nontender Vascular: No leg edema Neuro: Alert and oriented, the motor exam appears intact in the upper and lower extremities. Finger to nose testing is normal. The gait is ataxic. She trips to the right. She required assistance to ambulate. She was able to ambulate independently with a cane.  Skin: Plaque like erythema at the lower posterior neck and upper back     Medications: I have reviewed the patient's current medications.  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  a. Status post surgical resection at [facility] 07/20/2010 with the pathology confirming a diffuse large B-cell lymphoma.  b. Initiation of weekly rituximab therapy 08/31/2010, status post week number 4 on 09/23/2010. c. Restaging MRI of the brain 10/07/2010 without evidence of disease progression. d. Initiation of every 2 month "maintenance" rituximab on 11/16/2010. Last treated on 09/15/2011 e. Restaging MRI of the brain 02/06/2011 unchanged aside from a slight increase in altered T2 signal in the subcortical region at the anterior left temporal  lobe. f. Restaging MRI of the brain 07/07/2011-minimal change in appearance of T2 hyperintensity involving the anterior left temporal lobe g. Restaging MRI of the brain 01/15/2012-no significant change to suggest progression of tumor given limits of the unenhanced study 2. Gait ataxia and memory deficit, question related to the central nervous system lymphoma. The ataxia has progressed. 3. Hypertension. 4. Nonischemic cardiomyopathy. 5. Status post aortic valve replacement. 6. Transient ischemic attack in 2010. 7. Chronic renal insufficiency 8. Chronic exertional dyspnea-likely related to the cardiomyopathy 9. Eczema-like rash at the posterior neck and upper back  Disposition:  The gait appears more ataxic today. I am concerned this could be related to progression of the CNS lymphoma. We will check an MRI of the brain.  I am concerned [name] is living alone. She is at risk for falls. We will ask physical therapy to assess her in the home. I recommended she ambulate only with a walker. We discussed the situation with her friend/caretaker today. We will contact her son by telephone.  She will be scheduled for a return visit in 2-3 weeks. We recommended she try clobetasol cream for the rash.   Norma Coder, MD  02/24/2013  12:46 PM  Addendum: I discussed the situation with her son by telephone. He will accompany her at the next office visit. He plans to arrive on 03/13/2013.

## 2013-02-25 ENCOUNTER — Other Ambulatory Visit: Payer: Self-pay | Admitting: *Deleted

## 2013-02-25 ENCOUNTER — Telehealth: Payer: Self-pay | Admitting: Oncology

## 2013-02-25 DIAGNOSIS — C8339 Primary central nervous system lymphoma: Secondary | ICD-10-CM

## 2013-02-25 DIAGNOSIS — C8599 Non-Hodgkin lymphoma, unspecified, extranodal and solid organ sites: Secondary | ICD-10-CM

## 2013-02-25 DIAGNOSIS — R269 Unspecified abnormalities of gait and mobility: Secondary | ICD-10-CM

## 2013-02-25 NOTE — Telephone Encounter (Signed)
lvm for pt regarding to appt d.t change...mailed pt appt sched and letter °

## 2013-02-25 NOTE — Progress Notes (Signed)
Faxed order for physical therapy eval to [place].

## 2013-02-26 ENCOUNTER — Telehealth: Payer: Self-pay | Admitting: Internal Medicine

## 2013-02-26 DIAGNOSIS — H9190 Unspecified hearing loss, unspecified ear: Secondary | ICD-10-CM

## 2013-02-26 NOTE — Telephone Encounter (Signed)
Order to [facility] done

## 2013-02-26 NOTE — Telephone Encounter (Signed)
Pt's son called request referral for hearing test for Mrs. [name], [facility] [phone]. Pt is having hearing difficulty. Please advise

## 2013-03-03 ENCOUNTER — Other Ambulatory Visit: Payer: Self-pay | Admitting: Oncology

## 2013-03-03 ENCOUNTER — Ambulatory Visit (HOSPITAL_COMMUNITY)
Admission: RE | Admit: 2013-03-03 | Discharge: 2013-03-03 | Disposition: A | Discharge status: Home / Self Care | Payer: Medicare Other | Source: Ambulatory Visit | Attending: Oncology | Admitting: Oncology

## 2013-03-03 DIAGNOSIS — Z8673 Personal history of transient ischemic attack (TIA), and cerebral infarction without residual deficits: Secondary | ICD-10-CM | POA: Insufficient documentation

## 2013-03-03 DIAGNOSIS — C8589 Other specified types of non-Hodgkin lymphoma, extranodal and solid organ sites: Secondary | ICD-10-CM | POA: Diagnosis not present

## 2013-03-03 DIAGNOSIS — R279 Unspecified lack of coordination: Secondary | ICD-10-CM | POA: Diagnosis not present

## 2013-03-03 DIAGNOSIS — G9389 Other specified disorders of brain: Secondary | ICD-10-CM | POA: Insufficient documentation

## 2013-03-03 DIAGNOSIS — C8599 Non-Hodgkin lymphoma, unspecified, extranodal and solid organ sites: Secondary | ICD-10-CM

## 2013-03-03 DIAGNOSIS — C8581 Other specified types of non-Hodgkin lymphoma, lymph nodes of head, face, and neck: Secondary | ICD-10-CM | POA: Diagnosis not present

## 2013-03-03 DIAGNOSIS — Z9889 Other specified postprocedural states: Secondary | ICD-10-CM | POA: Insufficient documentation

## 2013-03-03 DIAGNOSIS — R51 Headache: Secondary | ICD-10-CM | POA: Diagnosis not present

## 2013-03-03 DIAGNOSIS — C8339 Primary central nervous system lymphoma: Secondary | ICD-10-CM | POA: Insufficient documentation

## 2013-03-03 DIAGNOSIS — I6789 Other cerebrovascular disease: Secondary | ICD-10-CM | POA: Insufficient documentation

## 2013-03-03 DIAGNOSIS — Q048 Other specified congenital malformations of brain: Secondary | ICD-10-CM | POA: Diagnosis not present

## 2013-03-03 MED ORDER — GADOBENATE DIMEGLUMINE 529 MG/ML IV SOLN
6.0000 mL | Freq: Once | INTRAVENOUS | Status: AC | PRN
  Administered 2013-03-03: 6 mL via INTRAVENOUS

## 2013-03-05 DIAGNOSIS — C8589 Other specified types of non-Hodgkin lymphoma, extranodal and solid organ sites: Secondary | ICD-10-CM | POA: Diagnosis not present

## 2013-03-05 DIAGNOSIS — N189 Chronic kidney disease, unspecified: Secondary | ICD-10-CM | POA: Diagnosis not present

## 2013-03-05 DIAGNOSIS — I129 Hypertensive chronic kidney disease with stage 1 through stage 4 chronic kidney disease, or unspecified chronic kidney disease: Secondary | ICD-10-CM | POA: Diagnosis not present

## 2013-03-05 DIAGNOSIS — R279 Unspecified lack of coordination: Secondary | ICD-10-CM | POA: Diagnosis not present

## 2013-03-05 DIAGNOSIS — I5030 Unspecified diastolic (congestive) heart failure: Secondary | ICD-10-CM | POA: Diagnosis not present

## 2013-03-05 DIAGNOSIS — R269 Unspecified abnormalities of gait and mobility: Secondary | ICD-10-CM | POA: Diagnosis not present

## 2013-03-05 DIAGNOSIS — F411 Generalized anxiety disorder: Secondary | ICD-10-CM | POA: Diagnosis not present

## 2013-03-05 DIAGNOSIS — I509 Heart failure, unspecified: Secondary | ICD-10-CM | POA: Diagnosis not present

## 2013-03-05 DIAGNOSIS — I739 Peripheral vascular disease, unspecified: Secondary | ICD-10-CM | POA: Diagnosis not present

## 2013-03-05 DIAGNOSIS — Z5189 Encounter for other specified aftercare: Secondary | ICD-10-CM | POA: Diagnosis not present

## 2013-03-05 DIAGNOSIS — D510 Vitamin B12 deficiency anemia due to intrinsic factor deficiency: Secondary | ICD-10-CM | POA: Diagnosis not present

## 2013-03-07 DIAGNOSIS — N189 Chronic kidney disease, unspecified: Secondary | ICD-10-CM | POA: Diagnosis not present

## 2013-03-07 DIAGNOSIS — I129 Hypertensive chronic kidney disease with stage 1 through stage 4 chronic kidney disease, or unspecified chronic kidney disease: Secondary | ICD-10-CM | POA: Diagnosis not present

## 2013-03-07 DIAGNOSIS — R269 Unspecified abnormalities of gait and mobility: Secondary | ICD-10-CM | POA: Diagnosis not present

## 2013-03-07 DIAGNOSIS — C8589 Other specified types of non-Hodgkin lymphoma, extranodal and solid organ sites: Secondary | ICD-10-CM | POA: Diagnosis not present

## 2013-03-07 DIAGNOSIS — R279 Unspecified lack of coordination: Secondary | ICD-10-CM | POA: Diagnosis not present

## 2013-03-07 DIAGNOSIS — Z5189 Encounter for other specified aftercare: Secondary | ICD-10-CM | POA: Diagnosis not present

## 2013-03-10 DIAGNOSIS — R269 Unspecified abnormalities of gait and mobility: Secondary | ICD-10-CM | POA: Diagnosis not present

## 2013-03-10 DIAGNOSIS — Z5189 Encounter for other specified aftercare: Secondary | ICD-10-CM | POA: Diagnosis not present

## 2013-03-10 DIAGNOSIS — R279 Unspecified lack of coordination: Secondary | ICD-10-CM | POA: Diagnosis not present

## 2013-03-10 DIAGNOSIS — C8589 Other specified types of non-Hodgkin lymphoma, extranodal and solid organ sites: Secondary | ICD-10-CM | POA: Diagnosis not present

## 2013-03-10 DIAGNOSIS — I129 Hypertensive chronic kidney disease with stage 1 through stage 4 chronic kidney disease, or unspecified chronic kidney disease: Secondary | ICD-10-CM | POA: Diagnosis not present

## 2013-03-10 DIAGNOSIS — N189 Chronic kidney disease, unspecified: Secondary | ICD-10-CM | POA: Diagnosis not present

## 2013-03-12 ENCOUNTER — Telehealth: Payer: Self-pay | Admitting: Internal Medicine

## 2013-03-12 DIAGNOSIS — I129 Hypertensive chronic kidney disease with stage 1 through stage 4 chronic kidney disease, or unspecified chronic kidney disease: Secondary | ICD-10-CM | POA: Diagnosis not present

## 2013-03-12 DIAGNOSIS — C8589 Other specified types of non-Hodgkin lymphoma, extranodal and solid organ sites: Secondary | ICD-10-CM | POA: Diagnosis not present

## 2013-03-12 DIAGNOSIS — N189 Chronic kidney disease, unspecified: Secondary | ICD-10-CM | POA: Diagnosis not present

## 2013-03-12 DIAGNOSIS — Z5189 Encounter for other specified aftercare: Secondary | ICD-10-CM | POA: Diagnosis not present

## 2013-03-12 DIAGNOSIS — R279 Unspecified lack of coordination: Secondary | ICD-10-CM | POA: Diagnosis not present

## 2013-03-12 DIAGNOSIS — R269 Unspecified abnormalities of gait and mobility: Secondary | ICD-10-CM | POA: Diagnosis not present

## 2013-03-12 MED ORDER — FLUCONAZOLE 100 MG PO TABS: 100.0000 mg | ORAL_TABLET | Freq: Every day | ORAL | Status: DC

## 2013-03-12 NOTE — Telephone Encounter (Signed)
Followed back up with home health nurse to advice about med.  Also left a message on Mrs. Bin's answering service.

## 2013-03-12 NOTE — Telephone Encounter (Signed)
Patient has a vagina ich.  She has used three tubes of vagisil with no improvement.  Please follow up with Home health Nurse, Benjamine Mola to let know if an rx can be called in or if she needs to come in for an appt.

## 2013-03-12 NOTE — Telephone Encounter (Signed)
Trial of fluconazole 100 mg once a day x 7 days for any potential yeast vaginitis. If this doesn't help will need an ov.

## 2013-03-13 ENCOUNTER — Ambulatory Visit: Payer: Medicare Other | Admitting: Nurse Practitioner

## 2013-03-13 DIAGNOSIS — Z5189 Encounter for other specified aftercare: Secondary | ICD-10-CM | POA: Diagnosis not present

## 2013-03-13 DIAGNOSIS — C8589 Other specified types of non-Hodgkin lymphoma, extranodal and solid organ sites: Secondary | ICD-10-CM | POA: Diagnosis not present

## 2013-03-13 DIAGNOSIS — I129 Hypertensive chronic kidney disease with stage 1 through stage 4 chronic kidney disease, or unspecified chronic kidney disease: Secondary | ICD-10-CM | POA: Diagnosis not present

## 2013-03-13 DIAGNOSIS — R279 Unspecified lack of coordination: Secondary | ICD-10-CM | POA: Diagnosis not present

## 2013-03-13 DIAGNOSIS — N189 Chronic kidney disease, unspecified: Secondary | ICD-10-CM | POA: Diagnosis not present

## 2013-03-13 DIAGNOSIS — R269 Unspecified abnormalities of gait and mobility: Secondary | ICD-10-CM | POA: Diagnosis not present

## 2013-03-14 ENCOUNTER — Ambulatory Visit (HOSPITAL_BASED_OUTPATIENT_CLINIC_OR_DEPARTMENT_OTHER): Payer: Medicare Other | Admitting: Nurse Practitioner

## 2013-03-14 ENCOUNTER — Telehealth: Payer: Self-pay | Admitting: Oncology

## 2013-03-14 ENCOUNTER — Other Ambulatory Visit: Payer: Self-pay | Admitting: Internal Medicine

## 2013-03-14 VITALS — BP 154/89 | HR 76 | Resp 17 | Ht 64.0 in | Wt 136.1 lb

## 2013-03-14 DIAGNOSIS — C8589 Other specified types of non-Hodgkin lymphoma, extranodal and solid organ sites: Secondary | ICD-10-CM

## 2013-03-14 DIAGNOSIS — C8599 Non-Hodgkin lymphoma, unspecified, extranodal and solid organ sites: Secondary | ICD-10-CM

## 2013-03-14 DIAGNOSIS — N189 Chronic kidney disease, unspecified: Secondary | ICD-10-CM | POA: Diagnosis not present

## 2013-03-14 DIAGNOSIS — Z5189 Encounter for other specified aftercare: Secondary | ICD-10-CM | POA: Diagnosis not present

## 2013-03-14 DIAGNOSIS — G3281 Cerebellar ataxia in diseases classified elsewhere: Secondary | ICD-10-CM | POA: Diagnosis not present

## 2013-03-14 DIAGNOSIS — I10 Essential (primary) hypertension: Secondary | ICD-10-CM | POA: Diagnosis not present

## 2013-03-14 DIAGNOSIS — R279 Unspecified lack of coordination: Secondary | ICD-10-CM | POA: Diagnosis not present

## 2013-03-14 DIAGNOSIS — R269 Unspecified abnormalities of gait and mobility: Secondary | ICD-10-CM | POA: Diagnosis not present

## 2013-03-14 DIAGNOSIS — I129 Hypertensive chronic kidney disease with stage 1 through stage 4 chronic kidney disease, or unspecified chronic kidney disease: Secondary | ICD-10-CM | POA: Diagnosis not present

## 2013-03-14 NOTE — Progress Notes (Addendum)
OFFICE PROGRESS NOTE  Interval history:  [name] returns for followup CNS lymphoma. Following her last visit 02/24/2013 she was referred for a brain MRI due to progressive ataxia. There was no evidence of recurrent/progressive lymphoma. Stable postoperative changes were noted.  She denies any falls. She and her son both feel her balance is fairly stable. She remains very active. No fevers or sweats. No unusual headaches. No visual disturbance. Appetite is stable. She notes intermittent numbness/tingling of both hands. Main complaint is a poor energy level. Her son notes progressive memory decline.  Objective: Filed Vitals:   03/14/13 1019  BP: 154/89  Pulse: 76  Resp: 17   Oropharynx is without thrush or ulceration. No palpable cervical, subclavicular or axillary lymph nodes. Lungs are clear. Regular cardiac rhythm. Abdomen soft and nontender. No organomegaly. No leg edema. Calves nontender. She is alert and oriented. Motor strength 5 over 5. Finger to nose intact. Clumsiness with rapid alternating movement of the left hand. Gait is mildly ataxic. She intermittently veers to the right.   Lab Results: Lab Results  Component Value Date   WBC 4.9 11/16/2011   HGB 12.9 01/15/2012   HCT 38.0 01/15/2012   MCV 98.5 11/16/2011   PLT 169 11/16/2011   NEUTROABS 3.1 11/16/2011    Chemistry:    Chemistry      Component Value Date/Time   NA 139 02/24/2013 1320   NA 137 05/02/2012 1350   K 4.7 02/24/2013 1320   K 4.4 05/02/2012 1350   CL 102 05/02/2012 1350   CL 101 11/16/2011 1357   CO2 26 02/24/2013 1320   CO2 27 05/02/2012 1350   BUN 28.9* 02/24/2013 1320   BUN 30* 05/02/2012 1350   CREATININE 1.2* 02/24/2013 1320   CREATININE 1.1 05/02/2012 1350      Component Value Date/Time   CALCIUM 9.9 02/24/2013 1320   CALCIUM 9.0 05/02/2012 1350   ALKPHOS 73 10/14/2012 1158   AST 19 10/14/2012 1158   ALT 10 10/14/2012 1158   BILITOT 1.2 10/14/2012 1158       Studies/Results: Mr Kizzie Fantasia  Contrast  03/03/2013   CLINICAL DATA:  80 year old female with CNS lymphoma. Surgery in 2012. Increasing ataxia and headaches. Restaging. Subsequent encounter.  EXAM: MRI HEAD WITHOUT AND WITH CONTRAST  TECHNIQUE: Multiplanar, multiecho pulse sequences of the brain and surrounding structures were obtained without and with intravenous contrast.  CONTRAST:  43mL MULTIHANCE GADOBENATE DIMEGLUMINE 529 MG/ML IV SOLN  COMPARISON:  Head CT 05/17/2012.  Brain MRI 01/15/2012 and earlier.  FINDINGS: Stable cerebral volume. Small postoperative left side extra-axial collection has resolved. No restricted diffusion or evidence of acute infarction. No ventriculomegaly. Major intracranial vascular flow voids are stable with intracranial artery dolichoectasia. Stable and negative pituitary, cervicomedullary junction, and visualized cervical spine.  Chronic colloid cyst measuring 8 mm diameter has not significantly changed since 2012. Mild progression of patchy bilateral cerebral white matter T2 and FLAIR hyperintensity. Chronic right thalamic lacunar infarct is stable. Chronic left cerebellar lacunar infarct is stable. Scattered chronic micro hemorrhages in the brain appear mildly increased in number, particularly in the cerebellum (series 8, image 7). No acute intracranial hemorrhage identified.  Postoperative encephalomalacia at the left anterior temporal lobe. Overlying left frontotemporal craniotomy. No abnormal enhancement identified. No suspicious dural thickening or enhancement.  Visible internal auditory structures appear normal. Stable orbits soft tissues. Trace right mastoid effusion is unchanged. Stable paranasal sinuses. Normal bone marrow signal. Visualized scalp soft tissues are within normal limits.  IMPRESSION: 1. Stable and satisfactory postoperative appearance of the brain from left temporal lobe lesion resection. 2. Chronic small vessel ischemia/disease with mild progression since 2012. 3. Chronic 8 mm colloid  cyst.  No ventriculomegaly.   Electronically Signed   By: Lars Pinks M.D.   On: 03/03/2013 14:30    Medications: I have reviewed the patient's current medications.  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  a. Status post surgical resection at [facility] 07/20/2010 with the pathology confirming a diffuse large B-cell lymphoma.  b. Initiation of weekly rituximab therapy 08/31/2010, status post week number 4 on 09/23/2010. c. Restaging MRI of the brain 10/07/2010 without evidence of disease progression. d. Initiation of every 2 month "maintenance" rituximab on 11/16/2010. Last treated on 09/15/2011 e. Restaging MRI of the brain 02/06/2011 unchanged aside from a slight increase in altered T2 signal in the subcortical region at the anterior left temporal lobe. f. Restaging MRI of the brain 07/07/2011-minimal change in appearance of T2 hyperintensity involving the anterior left temporal lobe g. Restaging MRI of the brain 01/15/2012-no significant change to suggest progression of tumor given limits of the unenhanced study h. MRI of brain 03/03/2013 with stable postoperative changes. 2. Gait ataxia and memory deficit, question related to the central nervous system lymphoma. The ataxia was progressive when here 02/24/2013. 3. Hypertension. 4. Nonischemic cardiomyopathy. 5. Status post aortic valve replacement. 6. Transient ischemic attack in 2010. 7. Chronic renal insufficiency 8. Chronic exertional dyspnea-likely related to the cardiomyopathy. 9. Eczema-like rash at the posterior neck and upper back.  Dispositon-her gait appears improved. The recent brain MRI showed no evidence of progression of the CNS lymphoma. She and her son understand that she is at risk for falls. We remain concerned that she lives alone. Her son shares that concern.  She will return for a followup visit in 3-4 months. She will  contact the office in the interim with any problems.   Patient seen with Dr. Benay Spice. 25 minutes were spent face-to-face at today's visit with the majority of that time involved in counseling/coordination of care.   [name] ANP/GNP-BC   This was a shared visit with [name]. Her gait is improved today. No clinical or x-ray evidence for progression of the CNS lymphoma.  [name], M.D.

## 2013-03-14 NOTE — Telephone Encounter (Signed)
Gave pt appt for MD, june 2015

## 2013-03-17 ENCOUNTER — Encounter: Payer: Self-pay | Admitting: Internal Medicine

## 2013-03-17 ENCOUNTER — Ambulatory Visit (INDEPENDENT_AMBULATORY_CARE_PROVIDER_SITE_OTHER): Payer: Medicare Other | Admitting: Internal Medicine

## 2013-03-17 VITALS — BP 130/70 | HR 68 | Temp 96.8°F | Wt 136.8 lb

## 2013-03-17 DIAGNOSIS — C8589 Other specified types of non-Hodgkin lymphoma, extranodal and solid organ sites: Secondary | ICD-10-CM

## 2013-03-17 DIAGNOSIS — D518 Other vitamin B12 deficiency anemias: Secondary | ICD-10-CM | POA: Diagnosis not present

## 2013-03-17 DIAGNOSIS — I10 Essential (primary) hypertension: Secondary | ICD-10-CM

## 2013-03-17 DIAGNOSIS — D510 Vitamin B12 deficiency anemia due to intrinsic factor deficiency: Secondary | ICD-10-CM

## 2013-03-17 DIAGNOSIS — Z23 Encounter for immunization: Secondary | ICD-10-CM

## 2013-03-17 DIAGNOSIS — I5032 Chronic diastolic (congestive) heart failure: Secondary | ICD-10-CM | POA: Diagnosis not present

## 2013-03-17 DIAGNOSIS — E785 Hyperlipidemia, unspecified: Secondary | ICD-10-CM

## 2013-03-17 DIAGNOSIS — C8599 Non-Hodgkin lymphoma, unspecified, extranodal and solid organ sites: Secondary | ICD-10-CM

## 2013-03-17 DIAGNOSIS — R269 Unspecified abnormalities of gait and mobility: Secondary | ICD-10-CM

## 2013-03-17 NOTE — Progress Notes (Signed)
Pre visit review using our clinic review tool, if applicable. No additional management support is needed unless otherwise documented below in the visit note. 

## 2013-03-17 NOTE — Patient Instructions (Signed)
Very good to see you and to learn that you had a good visit with Dr. Benay Spice.  Diastolic heart failure - see the 2D echo report. It is a problem with a stiff ventricle and decreased filling.  The "Rule of 2's:"  Weigh every day at the same time in the same garb. If you can more than 2 lbs in 2 days take your lasix for 2 days.  Positional vertigo - off balance when you stand up. Remember to wait after standing for the blood to catch up to your brain = the "rule of 20," - count to 20 before walking off.   You seem in general to be doing well. Will give the Prevnar pneumonia vaccine today and you should return in 4 -6 weeks for the Shingles vaccine - nurse visit.   You will do well with Dr. Alain Marion.

## 2013-03-17 NOTE — Progress Notes (Signed)
Subjective:    Patient ID: [name], female    DOB: 05/26/1919, 80 y.o.   MRN: [id number]  HPI [name] was recently seen Feb 20th by Dr. Benay Spice. She had an MRI which was OK, her exam was ok and this was a good response. Her gait is back to her baseline. Her son reports that she is doing well. She does have a little trouble with name recall. She does have a little trouble with balance and is supposed to use a device/walker and she does intermittently. She continues to have an itchy scalp which does respond to topical treatment.  Past Medical History  Diagnosis Date  . Nonischemic cardiomyopathy 2007 / Feb 2009    Miminial non-obs CAD cath (2007); EF previously 25%;  55% by echo 2012;  Echo 4/14:  mild LVH; mod asymmetric hypertrophy of the septum, EF 55-60%, Gr 1 DD, AVR ok (mean gradient 9), MAC, mild LAE, PASP 31  . Aortic insufficiency October 2007    Severe with ascending thoracic aneurysm; s/p pericardial tissue AVR and Heamsheild graft in October 2007; c/b pericardial effusion requiring window;  echo 03/2010: inferobasal HK, severe LVH, EF 55-60%, dynamic obstruction during Valsalva in mid cavity, peak velocity 232 cm/sec, peak gradient 22 mmHg, AVR ok, mod dilated aorta, mild LAE  . Hyperlipidemia   . Hypertension   . GERD (gastroesophageal reflux disease)   . History of cholelithiasis     Gallbladder dysfunction  . History of non anemic vitamin B12 deficiency   . Allergic rhinitis   . TIA (transient ischemic attack) October 2010  . Brain cancer   . CNS lymphoma    Past Surgical History  Procedure Laterality Date  . Tonsillectomy    . Aortic valve replacement  2007  . Bilateral vein stripping    . Left temporal tumor resected     Family History  Problem Relation Age of Onset  . Stroke Father   . Arthritis Other   . Coronary artery disease Other     Female 1st degree relative <50  . Diabetes Other   . Cerebral aneurysm Son    History   Social History  .  Marital Status: Widowed    Spouse Name: N/A    Number of Children: N/A  . Years of Education: N/A   Occupational History  . Not on file.   Social History Main Topics  . Smoking status: Never Smoker   . Smokeless tobacco: Never Used  . Alcohol Use: No  . Drug Use: No  . Sexual Activity: Not on file   Other Topics Concern  . Not on file   Social History Narrative   HSG   Married @ 1942, 34 years/ widowed 2007   Lives alone in [place]   She was a housewife   No h/o abuse   End-of-Life: No CPR; DNI; no prolonged tube feeding.  Provided signed "Out of Facility Order" and completed and signed MOST form.  These should accompany her to hospital, etc.  She should share her wished with the family (04/01/10)          Current Outpatient Prescriptions on File Prior to Visit  Medication Sig Dispense Refill  . Clobetasol Prop Emollient Base 0.05 % emollient cream Apply topically 2 (two) times daily.      . fluconazole (DIFLUCAN) 100 MG tablet Take 1 tablet (100 mg total) by mouth daily.  7 tablet  0  . fluticasone (FLONASE) 50 MCG/ACT nasal spray Place  2 sprays into the nose daily.  16 g  6  . folic acid (FOLVITE) 1 MG tablet TAKE 1 TABLET ONCE DAILY.  30 tablet  5  . furosemide (LASIX) 20 MG tablet Take 1 tablet (20 mg total) by mouth daily as needed.  30 tablet  6  . loratadine (CLARITIN) 10 MG tablet Take 1 tablet (10 mg total) by mouth daily.      . metoprolol succinate (TOPROL XL) 50 MG 24 hr tablet TAKE 1 TABLET ONCE DAILY.  30 tablet  2  . ramipril (ALTACE) 2.5 MG capsule TAKE (1) CAPSULE DAILY.  30 capsule  10  . simvastatin (ZOCOR) 20 MG tablet Take 1 tablet (20 mg total) by mouth every evening.  30 tablet  11   No current facility-administered medications on file prior to visit.      Review of Systems Constitutional:  Negative for fever, chills, activity change and unexpected weight change.  HEENT:  Negative for hearing loss, ear pain, congestion, neck stiffness and  postnasal drip. Negative for sore throat or swallowing problems. Negative for dental complaints.   Eyes: Negative for vision loss or change in visual acuity.  Respiratory: Negative for chest tightness and wheezing. Negative for DOE.   Cardiovascular: Negative for chest pain or palpitations. No decreased exercise tolerance Gastrointestinal: No change in bowel habit. No bloating or gas. No reflux or indigestion Genitourinary: Negative for urgency, frequency, flank pain and difficulty urinating.  Musculoskeletal: Negative for myalgias, back pain, arthralgias and gait problem.  Neurological: Positive for dizziness, negtaive for tremors, c/o legg weakness, negative for headaches.  Hematological: Negative for adenopathy.  Psychiatric/Behavioral: Negative for behavioral problems and dysphoric mood.       Objective:   Physical Exam Filed Vitals:   03/17/13 1116  BP: 130/70  Pulse: 68  Temp: 96.8 F (36 C)   Wt Readings from Last 3 Encounters:  03/17/13 136 lb 12.8 oz (62.052 kg)  03/14/13 136 lb 1.6 oz (61.735 kg)  02/24/13 133 lb (60.328 kg)   Gen'l WNWD woman looking younger than her chronological age 73- C&S clear, PERRLA Cor 2+ radial, RRR PUlm - normal respirations Neuro - A&O x 3, memory not formally tested but is ok, CN II-XII normal, cerebellar - mild problems with balance especially with position change, but able to get up to exam table with minimal assist, ambulates w/o assistance.       Assessment & Plan:

## 2013-03-18 NOTE — Assessment & Plan Note (Signed)
Encouraged to use walker, especially when out. Reminded her that "pride cometh before the fall."

## 2013-03-18 NOTE — Assessment & Plan Note (Signed)
Seen very recently by Dr. Benay Spice and she is doing well. MRI brain Feb '15 with recurrence.

## 2013-03-18 NOTE — Assessment & Plan Note (Signed)
Has occasional SOB/DOE. Does not take furosemide daily, just prn. Reviewed with her and her son the mechanism of diastolic dysfunction. Normal exam today  Plan "rule of 2's" explained. Also encouraged her to use monitor scales which she has in her home.

## 2013-03-18 NOTE — Assessment & Plan Note (Signed)
Taking and tolerating "Statin" therapy. Last lipid panel Nov '14 w/ LDL 104  Plan Continue present regimen

## 2013-03-18 NOTE — Assessment & Plan Note (Signed)
BP Readings from Last 3 Encounters:  03/17/13 130/70  03/14/13 154/89  02/24/13 145/77   Adequate control of BP (JNC 8 guidelines)

## 2013-03-18 NOTE — Assessment & Plan Note (Signed)
Lab Results  Component Value Date   VITAMINB12 >1500* 09/27/2011

## 2013-03-18 NOTE — Assessment & Plan Note (Signed)
Lab Results  Component Value Date   HGB 12.9 01/15/2012

## 2013-03-19 DIAGNOSIS — R269 Unspecified abnormalities of gait and mobility: Secondary | ICD-10-CM | POA: Diagnosis not present

## 2013-03-19 DIAGNOSIS — Z5189 Encounter for other specified aftercare: Secondary | ICD-10-CM | POA: Diagnosis not present

## 2013-03-19 DIAGNOSIS — I129 Hypertensive chronic kidney disease with stage 1 through stage 4 chronic kidney disease, or unspecified chronic kidney disease: Secondary | ICD-10-CM | POA: Diagnosis not present

## 2013-03-19 DIAGNOSIS — C8589 Other specified types of non-Hodgkin lymphoma, extranodal and solid organ sites: Secondary | ICD-10-CM | POA: Diagnosis not present

## 2013-03-19 DIAGNOSIS — R279 Unspecified lack of coordination: Secondary | ICD-10-CM | POA: Diagnosis not present

## 2013-03-19 DIAGNOSIS — N189 Chronic kidney disease, unspecified: Secondary | ICD-10-CM | POA: Diagnosis not present

## 2013-03-20 DIAGNOSIS — C8589 Other specified types of non-Hodgkin lymphoma, extranodal and solid organ sites: Secondary | ICD-10-CM | POA: Diagnosis not present

## 2013-03-20 DIAGNOSIS — I129 Hypertensive chronic kidney disease with stage 1 through stage 4 chronic kidney disease, or unspecified chronic kidney disease: Secondary | ICD-10-CM | POA: Diagnosis not present

## 2013-03-20 DIAGNOSIS — Z5189 Encounter for other specified aftercare: Secondary | ICD-10-CM | POA: Diagnosis not present

## 2013-03-20 DIAGNOSIS — R279 Unspecified lack of coordination: Secondary | ICD-10-CM | POA: Diagnosis not present

## 2013-03-20 DIAGNOSIS — R269 Unspecified abnormalities of gait and mobility: Secondary | ICD-10-CM | POA: Diagnosis not present

## 2013-03-20 DIAGNOSIS — N189 Chronic kidney disease, unspecified: Secondary | ICD-10-CM | POA: Diagnosis not present

## 2013-03-21 DIAGNOSIS — Z5189 Encounter for other specified aftercare: Secondary | ICD-10-CM

## 2013-03-21 DIAGNOSIS — N189 Chronic kidney disease, unspecified: Secondary | ICD-10-CM | POA: Diagnosis not present

## 2013-03-21 DIAGNOSIS — R279 Unspecified lack of coordination: Secondary | ICD-10-CM

## 2013-03-21 DIAGNOSIS — C8589 Other specified types of non-Hodgkin lymphoma, extranodal and solid organ sites: Secondary | ICD-10-CM | POA: Diagnosis not present

## 2013-03-21 DIAGNOSIS — R269 Unspecified abnormalities of gait and mobility: Secondary | ICD-10-CM | POA: Diagnosis not present

## 2013-03-21 DIAGNOSIS — I129 Hypertensive chronic kidney disease with stage 1 through stage 4 chronic kidney disease, or unspecified chronic kidney disease: Secondary | ICD-10-CM | POA: Diagnosis not present

## 2013-03-24 ENCOUNTER — Other Ambulatory Visit: Payer: Self-pay

## 2013-03-24 MED ORDER — METOPROLOL SUCCINATE ER 50 MG PO TB24: ORAL_TABLET | ORAL | Status: DC

## 2013-03-26 DIAGNOSIS — N189 Chronic kidney disease, unspecified: Secondary | ICD-10-CM | POA: Diagnosis not present

## 2013-03-26 DIAGNOSIS — R279 Unspecified lack of coordination: Secondary | ICD-10-CM | POA: Diagnosis not present

## 2013-03-26 DIAGNOSIS — R269 Unspecified abnormalities of gait and mobility: Secondary | ICD-10-CM | POA: Diagnosis not present

## 2013-03-26 DIAGNOSIS — Z5189 Encounter for other specified aftercare: Secondary | ICD-10-CM | POA: Diagnosis not present

## 2013-03-26 DIAGNOSIS — C8589 Other specified types of non-Hodgkin lymphoma, extranodal and solid organ sites: Secondary | ICD-10-CM | POA: Diagnosis not present

## 2013-03-26 DIAGNOSIS — I129 Hypertensive chronic kidney disease with stage 1 through stage 4 chronic kidney disease, or unspecified chronic kidney disease: Secondary | ICD-10-CM | POA: Diagnosis not present

## 2013-03-27 ENCOUNTER — Telehealth: Payer: Self-pay

## 2013-03-27 DIAGNOSIS — I129 Hypertensive chronic kidney disease with stage 1 through stage 4 chronic kidney disease, or unspecified chronic kidney disease: Secondary | ICD-10-CM | POA: Diagnosis not present

## 2013-03-27 DIAGNOSIS — N189 Chronic kidney disease, unspecified: Secondary | ICD-10-CM | POA: Diagnosis not present

## 2013-03-27 DIAGNOSIS — C8589 Other specified types of non-Hodgkin lymphoma, extranodal and solid organ sites: Secondary | ICD-10-CM | POA: Diagnosis not present

## 2013-03-27 DIAGNOSIS — R279 Unspecified lack of coordination: Secondary | ICD-10-CM | POA: Diagnosis not present

## 2013-03-27 DIAGNOSIS — Z5189 Encounter for other specified aftercare: Secondary | ICD-10-CM | POA: Diagnosis not present

## 2013-03-27 DIAGNOSIS — R269 Unspecified abnormalities of gait and mobility: Secondary | ICD-10-CM | POA: Diagnosis not present

## 2013-03-27 NOTE — Telephone Encounter (Signed)
Ok for PT ( in addition to going bowling twice a week)

## 2013-03-27 NOTE — Telephone Encounter (Signed)
Elizabeth from [facility] called and is hoping to get an order for physical therapy (X4) for the pt.   Daneen Schick , [place] [facility]) [phone]

## 2013-03-27 NOTE — Telephone Encounter (Signed)
Left a message on voicemail for [name] okay for PT

## 2013-03-28 ENCOUNTER — Ambulatory Visit: Payer: Medicare Other | Admitting: Oncology

## 2013-03-31 ENCOUNTER — Telehealth: Payer: Self-pay | Admitting: *Deleted

## 2013-03-31 DIAGNOSIS — Z5189 Encounter for other specified aftercare: Secondary | ICD-10-CM | POA: Diagnosis not present

## 2013-03-31 DIAGNOSIS — R269 Unspecified abnormalities of gait and mobility: Secondary | ICD-10-CM | POA: Diagnosis not present

## 2013-03-31 DIAGNOSIS — R279 Unspecified lack of coordination: Secondary | ICD-10-CM | POA: Diagnosis not present

## 2013-03-31 DIAGNOSIS — N189 Chronic kidney disease, unspecified: Secondary | ICD-10-CM | POA: Diagnosis not present

## 2013-03-31 DIAGNOSIS — C8589 Other specified types of non-Hodgkin lymphoma, extranodal and solid organ sites: Secondary | ICD-10-CM | POA: Diagnosis not present

## 2013-03-31 DIAGNOSIS — I129 Hypertensive chronic kidney disease with stage 1 through stage 4 chronic kidney disease, or unspecified chronic kidney disease: Secondary | ICD-10-CM | POA: Diagnosis not present

## 2013-03-31 NOTE — Telephone Encounter (Signed)
Occupational Therapist phoned requesting a 2 week 1 continue OT orders for patient.  CB# [phone]

## 2013-03-31 NOTE — Telephone Encounter (Signed)
K

## 2013-04-02 DIAGNOSIS — R279 Unspecified lack of coordination: Secondary | ICD-10-CM | POA: Diagnosis not present

## 2013-04-02 DIAGNOSIS — Z5189 Encounter for other specified aftercare: Secondary | ICD-10-CM | POA: Diagnosis not present

## 2013-04-02 DIAGNOSIS — I129 Hypertensive chronic kidney disease with stage 1 through stage 4 chronic kidney disease, or unspecified chronic kidney disease: Secondary | ICD-10-CM | POA: Diagnosis not present

## 2013-04-02 DIAGNOSIS — N189 Chronic kidney disease, unspecified: Secondary | ICD-10-CM | POA: Diagnosis not present

## 2013-04-02 DIAGNOSIS — C8589 Other specified types of non-Hodgkin lymphoma, extranodal and solid organ sites: Secondary | ICD-10-CM | POA: Diagnosis not present

## 2013-04-02 DIAGNOSIS — R269 Unspecified abnormalities of gait and mobility: Secondary | ICD-10-CM | POA: Diagnosis not present

## 2013-04-03 NOTE — Telephone Encounter (Signed)
Left detailed message on [facility] VM with MDs message

## 2013-04-04 DIAGNOSIS — N189 Chronic kidney disease, unspecified: Secondary | ICD-10-CM | POA: Diagnosis not present

## 2013-04-04 DIAGNOSIS — Z5189 Encounter for other specified aftercare: Secondary | ICD-10-CM | POA: Diagnosis not present

## 2013-04-04 DIAGNOSIS — R269 Unspecified abnormalities of gait and mobility: Secondary | ICD-10-CM | POA: Diagnosis not present

## 2013-04-04 DIAGNOSIS — C8589 Other specified types of non-Hodgkin lymphoma, extranodal and solid organ sites: Secondary | ICD-10-CM | POA: Diagnosis not present

## 2013-04-04 DIAGNOSIS — R279 Unspecified lack of coordination: Secondary | ICD-10-CM | POA: Diagnosis not present

## 2013-04-04 DIAGNOSIS — I129 Hypertensive chronic kidney disease with stage 1 through stage 4 chronic kidney disease, or unspecified chronic kidney disease: Secondary | ICD-10-CM | POA: Diagnosis not present

## 2013-04-07 DIAGNOSIS — Z5189 Encounter for other specified aftercare: Secondary | ICD-10-CM | POA: Diagnosis not present

## 2013-04-07 DIAGNOSIS — C8589 Other specified types of non-Hodgkin lymphoma, extranodal and solid organ sites: Secondary | ICD-10-CM | POA: Diagnosis not present

## 2013-04-07 DIAGNOSIS — R279 Unspecified lack of coordination: Secondary | ICD-10-CM | POA: Diagnosis not present

## 2013-04-07 DIAGNOSIS — R269 Unspecified abnormalities of gait and mobility: Secondary | ICD-10-CM | POA: Diagnosis not present

## 2013-04-07 DIAGNOSIS — I129 Hypertensive chronic kidney disease with stage 1 through stage 4 chronic kidney disease, or unspecified chronic kidney disease: Secondary | ICD-10-CM | POA: Diagnosis not present

## 2013-04-07 DIAGNOSIS — N189 Chronic kidney disease, unspecified: Secondary | ICD-10-CM | POA: Diagnosis not present

## 2013-04-09 DIAGNOSIS — R279 Unspecified lack of coordination: Secondary | ICD-10-CM | POA: Diagnosis not present

## 2013-04-09 DIAGNOSIS — N189 Chronic kidney disease, unspecified: Secondary | ICD-10-CM | POA: Diagnosis not present

## 2013-04-09 DIAGNOSIS — C8589 Other specified types of non-Hodgkin lymphoma, extranodal and solid organ sites: Secondary | ICD-10-CM | POA: Diagnosis not present

## 2013-04-09 DIAGNOSIS — Z5189 Encounter for other specified aftercare: Secondary | ICD-10-CM | POA: Diagnosis not present

## 2013-04-09 DIAGNOSIS — R269 Unspecified abnormalities of gait and mobility: Secondary | ICD-10-CM | POA: Diagnosis not present

## 2013-04-09 DIAGNOSIS — I129 Hypertensive chronic kidney disease with stage 1 through stage 4 chronic kidney disease, or unspecified chronic kidney disease: Secondary | ICD-10-CM | POA: Diagnosis not present

## 2013-04-10 DIAGNOSIS — N189 Chronic kidney disease, unspecified: Secondary | ICD-10-CM | POA: Diagnosis not present

## 2013-04-10 DIAGNOSIS — R279 Unspecified lack of coordination: Secondary | ICD-10-CM | POA: Diagnosis not present

## 2013-04-10 DIAGNOSIS — Z5189 Encounter for other specified aftercare: Secondary | ICD-10-CM | POA: Diagnosis not present

## 2013-04-10 DIAGNOSIS — I129 Hypertensive chronic kidney disease with stage 1 through stage 4 chronic kidney disease, or unspecified chronic kidney disease: Secondary | ICD-10-CM | POA: Diagnosis not present

## 2013-04-10 DIAGNOSIS — R269 Unspecified abnormalities of gait and mobility: Secondary | ICD-10-CM | POA: Diagnosis not present

## 2013-04-10 DIAGNOSIS — C8589 Other specified types of non-Hodgkin lymphoma, extranodal and solid organ sites: Secondary | ICD-10-CM | POA: Diagnosis not present

## 2013-06-23 DIAGNOSIS — M79609 Pain in unspecified limb: Secondary | ICD-10-CM | POA: Diagnosis not present

## 2013-06-23 DIAGNOSIS — L600 Ingrowing nail: Secondary | ICD-10-CM | POA: Diagnosis not present

## 2013-07-03 ENCOUNTER — Other Ambulatory Visit: Payer: Self-pay | Admitting: Cardiology

## 2013-07-04 ENCOUNTER — Telehealth: Payer: Self-pay | Admitting: Oncology

## 2013-07-04 ENCOUNTER — Ambulatory Visit: Payer: Medicare Other | Admitting: *Deleted

## 2013-07-04 ENCOUNTER — Ambulatory Visit (HOSPITAL_BASED_OUTPATIENT_CLINIC_OR_DEPARTMENT_OTHER): Payer: Medicare Other | Admitting: Oncology

## 2013-07-04 VITALS — BP 154/83 | HR 71 | Temp 97.6°F | Resp 18 | Ht 64.0 in | Wt 135.6 lb

## 2013-07-04 DIAGNOSIS — N189 Chronic kidney disease, unspecified: Secondary | ICD-10-CM | POA: Diagnosis not present

## 2013-07-04 DIAGNOSIS — I10 Essential (primary) hypertension: Secondary | ICD-10-CM | POA: Diagnosis not present

## 2013-07-04 DIAGNOSIS — C8581 Other specified types of non-Hodgkin lymphoma, lymph nodes of head, face, and neck: Secondary | ICD-10-CM

## 2013-07-04 DIAGNOSIS — C8589 Other specified types of non-Hodgkin lymphoma, extranodal and solid organ sites: Secondary | ICD-10-CM

## 2013-07-04 DIAGNOSIS — Z23 Encounter for immunization: Secondary | ICD-10-CM

## 2013-07-04 MED ORDER — FLUTICASONE PROPIONATE 50 MCG/ACT NA SUSP: 2.0000 | Freq: Every day | NASAL | Status: DC

## 2013-07-04 NOTE — Telephone Encounter (Signed)
Gave pt appt for Md only for October

## 2013-07-04 NOTE — Progress Notes (Signed)
  [facility] OFFICE PROGRESS NOTE   Diagnosis: CNS lymphoma  INTERVAL HISTORY:   She returns today with her son. She continues to have gait ataxia. No recent fall. She continues to bowl weekly. She complains of malaise.  Objective:  Vital signs in last 24 hours:  Blood pressure 154/83, pulse 71, temperature 97.6 F (36.4 C), temperature source Oral, resp. rate 18, height 5\' 4"  (1.626 m), weight 135 lb 9.6 oz (61.508 kg).    HEENT: Neck without mass Lymphatics: No cervical, superventricular, or axillar node Resp: Lungs clear bilaterally, no respiratory distress Cardio: Regular rate and rhythm with premature beats GI: No hepatosplenomegaly, nontender Vascular: No leg edema Neuro: Alert, follows commands, oriented. The motor exam appears intact in the upper and lower extremities. She ambulates with an ataxic gait.    Medications: I have reviewed the patient's current medications.  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  a. 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  b. Initiation of weekly rituximab therapy 08/31/2010, status post week number 4 on 09/23/2010. c. Restaging MRI of the brain 10/07/2010 without evidence of disease progression. d. Initiation of every 2 month "maintenance" rituximab on 11/16/2010. Last treated on 09/15/2011 e. Restaging MRI of the brain 02/06/2011 unchanged aside from a slight increase in altered T2 signal in the subcortical region at the anterior left temporal lobe. f. Restaging MRI of the brain 07/07/2011-minimal change in appearance of T2 hyperintensity involving the anterior left temporal lobe g. Restaging MRI of the brain 01/15/2012-no significant change to suggest progression of tumor given limits of the unenhanced study h. MRI of brain 03/03/2013 with stable  postoperative changes. 2. Gait ataxia and memory deficit, question related to the central nervous system lymphoma. The ataxia appears stable today.  3. Hypertension. 4. Nonischemic cardiomyopathy. 5. Status post aortic valve replacement. 6. Transient ischemic attack in 2010. 7. Chronic renal insufficiency 8. Chronic exertional dyspnea-likely related to the cardiomyopathy. 9. Eczema-like rash at the posterior neck and upper back.  Disposition:  Ms. Dow appears stable. I discussed the current situation with her son. We decided against a restaging MRI unless she has new symptoms. I encouraged her to use the walker when ambulating. We also discussed the possibility of her relocating to an assisted living facility. She would like to stay in her home for now. She has a good support network with friends and family. Ms. Bruhl will return for an office visit in 4 months. Her son will contact us in the interim if she develops new symptoms.  Betsy Coder, MD  07/04/2013  11:23 AM

## 2013-07-04 NOTE — Patient Instructions (Signed)

## 2013-07-09 DIAGNOSIS — L600 Ingrowing nail: Secondary | ICD-10-CM | POA: Diagnosis not present

## 2013-07-30 ENCOUNTER — Other Ambulatory Visit: Payer: Self-pay | Admitting: *Deleted

## 2013-07-30 MED ORDER — RAMIPRIL 2.5 MG PO CAPS: ORAL_CAPSULE | ORAL | Status: DC

## 2013-08-21 ENCOUNTER — Telehealth: Payer: Self-pay | Admitting: *Deleted

## 2013-08-21 NOTE — Telephone Encounter (Signed)
Received message from pt asking "Can I come in to have a quick physical?  I'm flying to [facility] Saturday and want to know if it's OK for me to fly since I'm having some SOB"  Per Dr. Benay Spice; notified pt that she needs to contact her PCP for evaluation.  Pt verbalized understanding of instruction and expressed appreciation for call back.

## 2013-09-12 DIAGNOSIS — M79609 Pain in unspecified limb: Secondary | ICD-10-CM | POA: Diagnosis not present

## 2013-09-19 ENCOUNTER — Other Ambulatory Visit: Payer: Self-pay | Admitting: Dermatology

## 2013-09-19 ENCOUNTER — Ambulatory Visit (INDEPENDENT_AMBULATORY_CARE_PROVIDER_SITE_OTHER): Payer: Medicare Other | Admitting: Internal Medicine

## 2013-09-19 ENCOUNTER — Encounter: Payer: Self-pay | Admitting: Internal Medicine

## 2013-09-19 VITALS — BP 168/92 | HR 75 | Temp 97.6°F | Resp 18 | Ht 64.0 in | Wt 133.0 lb

## 2013-09-19 DIAGNOSIS — F411 Generalized anxiety disorder: Secondary | ICD-10-CM | POA: Diagnosis not present

## 2013-09-19 DIAGNOSIS — L280 Lichen simplex chronicus: Secondary | ICD-10-CM | POA: Diagnosis not present

## 2013-09-19 DIAGNOSIS — C44320 Squamous cell carcinoma of skin of unspecified parts of face: Secondary | ICD-10-CM | POA: Diagnosis not present

## 2013-09-19 DIAGNOSIS — I5032 Chronic diastolic (congestive) heart failure: Secondary | ICD-10-CM

## 2013-09-19 DIAGNOSIS — Z23 Encounter for immunization: Secondary | ICD-10-CM | POA: Diagnosis not present

## 2013-09-19 DIAGNOSIS — R5383 Other fatigue: Secondary | ICD-10-CM | POA: Insufficient documentation

## 2013-09-19 DIAGNOSIS — D518 Other vitamin B12 deficiency anemias: Secondary | ICD-10-CM | POA: Diagnosis not present

## 2013-09-19 DIAGNOSIS — L219 Seborrheic dermatitis, unspecified: Secondary | ICD-10-CM | POA: Diagnosis not present

## 2013-09-19 DIAGNOSIS — L570 Actinic keratosis: Secondary | ICD-10-CM | POA: Diagnosis not present

## 2013-09-19 DIAGNOSIS — I10 Essential (primary) hypertension: Secondary | ICD-10-CM

## 2013-09-19 DIAGNOSIS — R5381 Other malaise: Secondary | ICD-10-CM

## 2013-09-19 DIAGNOSIS — L820 Inflamed seborrheic keratosis: Secondary | ICD-10-CM | POA: Diagnosis not present

## 2013-09-19 DIAGNOSIS — R5382 Chronic fatigue, unspecified: Secondary | ICD-10-CM

## 2013-09-19 MED ORDER — METOPROLOL SUCCINATE ER 50 MG PO TB24: ORAL_TABLET | ORAL | Status: DC

## 2013-09-19 MED ORDER — VITAMIN B-12 1000 MCG SL SUBL: 1.0000 | SUBLINGUAL_TABLET | Freq: Every day | SUBLINGUAL | Status: AC

## 2013-09-19 NOTE — Progress Notes (Signed)
Pre visit review using our clinic review tool, if applicable. No additional management support is needed unless otherwise documented below in the visit note. 

## 2013-09-19 NOTE — Assessment & Plan Note (Signed)
Continue with current prescription therapy as reflected on the Med list.  

## 2013-09-19 NOTE — Assessment & Plan Note (Signed)
Not using B12 for ?reason 8/15

## 2013-09-19 NOTE — Progress Notes (Signed)
   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 Active in a bowling league.  BP Readings from Last 3 Encounters:  09/19/13 168/92  07/04/13 154/83  03/17/13 130/70    Wt Readings from Last 3 Encounters:  09/19/13 133 lb (60.328 kg)  07/04/13 135 lb 9.6 oz (61.508 kg)  03/17/13 136 lb 12.8 oz (62.052 kg)     Review of Systems  Constitutional: Positive for fatigue. Negative for chills, activity change, appetite change and unexpected weight change.  HENT: Negative for congestion, mouth sores and sinus pressure.   Eyes: Negative for visual disturbance.  Respiratory: Negative for cough, chest tightness and wheezing.   Gastrointestinal: Negative for nausea and abdominal pain.  Genitourinary: Negative for frequency, difficulty urinating and vaginal pain.  Musculoskeletal: Negative for back pain and gait problem.  Skin: Negative for pallor, rash and wound.  Neurological: Negative for dizziness, tremors, seizures, weakness, numbness and headaches.  Psychiatric/Behavioral: Negative for suicidal ideas, hallucinations, confusion and sleep disturbance. The patient is nervous/anxious.        Objective:   Physical Exam  Constitutional: She appears well-developed. No distress.  Looks younger than her stated age NAD  HENT:  Head: Normocephalic.  Right Ear: External ear normal.  Left Ear: External ear normal.  Nose: Nose normal.  Mouth/Throat: Oropharynx is clear and moist.  Eyes: Conjunctivae are normal. Pupils are equal, round, and reactive to light. Right eye exhibits no discharge. Left eye exhibits no discharge.  Neck: Normal range of motion. Neck supple. No JVD present. No tracheal deviation present. No thyromegaly present.  Cardiovascular: Normal rate, regular rhythm and normal heart sounds.   Pulmonary/Chest: No  stridor. No respiratory distress. She has no wheezes.  Abdominal: Soft. Bowel sounds are normal. She exhibits no distension and no mass. There is no tenderness. There is no rebound and no guarding.  Musculoskeletal: She exhibits no edema and no tenderness.  Lymphadenopathy:    She has no cervical adenopathy.  Neurological: She displays normal reflexes. No cranial nerve deficit. She exhibits normal muscle tone. Coordination abnormal.  Skin: No rash noted. No erythema.  Psychiatric: She has a normal mood and affect. Her behavior is normal. Judgment and thought content normal.    Lab Results  Component Value Date   WBC 4.9 11/16/2011   HGB 12.9 01/15/2012   HCT 38.0 01/15/2012   PLT 169 11/16/2011   GLUCOSE 98 02/24/2013   CHOL 166 10/14/2012   TRIG 120.0 10/14/2012   HDL 38.40* 10/14/2012   LDLDIRECT 130.1 04/01/2010   LDLCALC 104* 10/14/2012   ALT 10 10/14/2012   AST 19 10/14/2012   NA 139 02/24/2013   K 4.7 02/24/2013   CL 102 05/02/2012   CREATININE 1.2* 02/24/2013   BUN 28.9* 02/24/2013   CO2 26 02/24/2013   TSH 0.61 05/02/2012   INR 0.98 11/11/2008         Assessment & Plan:

## 2013-09-19 NOTE — Patient Instructions (Signed)
Take Metoprolol at night Re-start Vitamin B12

## 2013-09-19 NOTE — Assessment & Plan Note (Signed)
Will review meds Re-start B12

## 2013-09-22 ENCOUNTER — Ambulatory Visit (INDEPENDENT_AMBULATORY_CARE_PROVIDER_SITE_OTHER): Payer: Medicare Other | Admitting: *Deleted

## 2013-09-22 ENCOUNTER — Other Ambulatory Visit: Payer: Self-pay | Admitting: Internal Medicine

## 2013-09-22 DIAGNOSIS — N3090 Cystitis, unspecified without hematuria: Secondary | ICD-10-CM

## 2013-09-22 DIAGNOSIS — R7989 Other specified abnormal findings of blood chemistry: Secondary | ICD-10-CM

## 2013-09-22 DIAGNOSIS — Z23 Encounter for immunization: Secondary | ICD-10-CM | POA: Diagnosis not present

## 2013-09-22 DIAGNOSIS — I5032 Chronic diastolic (congestive) heart failure: Secondary | ICD-10-CM | POA: Diagnosis not present

## 2013-09-22 DIAGNOSIS — D518 Other vitamin B12 deficiency anemias: Secondary | ICD-10-CM

## 2013-09-22 DIAGNOSIS — I10 Essential (primary) hypertension: Secondary | ICD-10-CM | POA: Diagnosis not present

## 2013-09-22 DIAGNOSIS — F411 Generalized anxiety disorder: Secondary | ICD-10-CM | POA: Diagnosis not present

## 2013-09-22 DIAGNOSIS — R5383 Other fatigue: Secondary | ICD-10-CM

## 2013-09-22 DIAGNOSIS — R5381 Other malaise: Secondary | ICD-10-CM

## 2013-09-22 DIAGNOSIS — R5382 Chronic fatigue, unspecified: Secondary | ICD-10-CM

## 2013-09-22 LAB — HEPATIC FUNCTION PANEL
ALT: 12 U/L (ref 0–35)
AST: 19 U/L (ref 0–37)
Albumin: 4.2 g/dL (ref 3.5–5.2)
Alkaline Phosphatase: 70 U/L (ref 39–117)
Bilirubin, Direct: 0.2 mg/dL (ref 0.0–0.3)
TOTAL PROTEIN: 7.5 g/dL (ref 6.0–8.3)
Total Bilirubin: 1.5 mg/dL — ABNORMAL HIGH (ref 0.2–1.2)

## 2013-09-22 LAB — URINALYSIS, ROUTINE W REFLEX MICROSCOPIC
BILIRUBIN URINE: NEGATIVE
HGB URINE DIPSTICK: NEGATIVE
KETONES UR: NEGATIVE
Nitrite: NEGATIVE
RBC / HPF: NONE SEEN (ref 0–?)
Specific Gravity, Urine: 1.015 (ref 1.000–1.030)
TOTAL PROTEIN, URINE-UPE24: 30 — AB
Urine Glucose: NEGATIVE
Urobilinogen, UA: 0.2 (ref 0.0–1.0)
pH: 5.5 (ref 5.0–8.0)

## 2013-09-22 LAB — BASIC METABOLIC PANEL
BUN: 25 mg/dL — AB (ref 6–23)
CHLORIDE: 101 meq/L (ref 96–112)
CO2: 26 mEq/L (ref 19–32)
Calcium: 9.5 mg/dL (ref 8.4–10.5)
Creatinine, Ser: 1.3 mg/dL — ABNORMAL HIGH (ref 0.4–1.2)
GFR: 40.32 mL/min — ABNORMAL LOW (ref >60.00)
Glucose, Bld: 103 mg/dL — ABNORMAL HIGH (ref 70–99)
POTASSIUM: 4.3 meq/L (ref 3.5–5.1)
Sodium: 136 mEq/L (ref 135–145)

## 2013-09-22 LAB — CBC WITH DIFFERENTIAL/PLATELET
BASOS PCT: 0.5 % (ref 0.0–3.0)
Basophils Absolute: 0 10*3/uL (ref 0.0–0.1)
Eosinophils Absolute: 0.3 10*3/uL (ref 0.0–0.7)
Eosinophils Relative: 4.9 % (ref 0.0–5.0)
HCT: 40.2 % (ref 36.0–46.0)
HEMOGLOBIN: 13.4 g/dL (ref 12.0–15.0)
Lymphocytes Relative: 14.2 % (ref 12.0–46.0)
Lymphs Abs: 0.7 10*3/uL (ref 0.7–4.0)
MCHC: 33.4 g/dL (ref 30.0–36.0)
MCV: 102.8 fl — AB (ref 78.0–100.0)
MONO ABS: 0.7 10*3/uL (ref 0.1–1.0)
Monocytes Relative: 12.6 % — ABNORMAL HIGH (ref 3.0–12.0)
Neutro Abs: 3.6 10*3/uL (ref 1.4–7.7)
Neutrophils Relative %: 67.8 % (ref 43.0–77.0)
Platelets: 198 10*3/uL (ref 150.0–400.0)
RBC: 3.91 Mil/uL (ref 3.87–5.11)
RDW: 13 % (ref 11.5–15.5)
WBC: 5.3 10*3/uL (ref 4.0–10.5)

## 2013-09-22 LAB — LIPID PANEL
CHOL/HDL RATIO: 4
CHOLESTEROL: 153 mg/dL (ref 0–200)
HDL: 35.7 mg/dL — ABNORMAL LOW (ref >39.00)
LDL CALC: 90 mg/dL (ref 0–99)
NonHDL: 117.3
Triglycerides: 138 mg/dL (ref 0.0–149.0)
VLDL: 27.6 mg/dL (ref 0.0–40.0)

## 2013-09-22 LAB — VITAMIN B12: Vitamin B-12: 1185 pg/mL — ABNORMAL HIGH (ref 211–911)

## 2013-09-22 LAB — TSH: TSH: 4.54 u[IU]/mL — ABNORMAL HIGH (ref 0.35–4.50)

## 2013-09-22 LAB — CK: Total CK: 69 U/L (ref 7–177)

## 2013-09-22 MED ORDER — CIPROFLOXACIN HCL 250 MG PO TABS: 250.0000 mg | ORAL_TABLET | Freq: Two times a day (BID) | ORAL | Status: DC

## 2013-09-24 ENCOUNTER — Telehealth: Payer: Self-pay | Admitting: Internal Medicine

## 2013-09-24 NOTE — Telephone Encounter (Signed)
Pt informed of recent lab results and to take Cipro for bladder infection.

## 2013-09-24 NOTE — Telephone Encounter (Signed)
Pt request lab result that was done 09/22/13. Please give pt a call, pt would like to speak to the assistant about medication that Dr. Camila Li send in for her as well.

## 2013-10-16 DIAGNOSIS — Z85828 Personal history of other malignant neoplasm of skin: Secondary | ICD-10-CM | POA: Diagnosis not present

## 2013-10-16 DIAGNOSIS — C44320 Squamous cell carcinoma of skin of unspecified parts of face: Secondary | ICD-10-CM | POA: Diagnosis not present

## 2013-10-20 ENCOUNTER — Other Ambulatory Visit: Payer: Self-pay | Admitting: Internal Medicine

## 2013-10-23 ENCOUNTER — Ambulatory Visit: Payer: Medicare Other | Admitting: Oncology

## 2013-11-06 ENCOUNTER — Encounter: Payer: Self-pay | Admitting: Oncology

## 2013-11-06 ENCOUNTER — Telehealth: Payer: Self-pay | Admitting: Oncology

## 2013-11-06 ENCOUNTER — Ambulatory Visit (HOSPITAL_BASED_OUTPATIENT_CLINIC_OR_DEPARTMENT_OTHER): Payer: Medicare Other | Admitting: Oncology

## 2013-11-06 VITALS — BP 118/56 | HR 85 | Temp 97.1°F | Resp 18 | Ht 64.0 in | Wt 127.9 lb

## 2013-11-06 DIAGNOSIS — C8330 Diffuse large B-cell lymphoma, unspecified site: Secondary | ICD-10-CM

## 2013-11-06 DIAGNOSIS — I10 Essential (primary) hypertension: Secondary | ICD-10-CM

## 2013-11-06 DIAGNOSIS — C8599 Non-Hodgkin lymphoma, unspecified, extranodal and solid organ sites: Secondary | ICD-10-CM

## 2013-11-06 DIAGNOSIS — R260 Ataxic gait: Secondary | ICD-10-CM | POA: Diagnosis not present

## 2013-11-06 DIAGNOSIS — R0609 Other forms of dyspnea: Secondary | ICD-10-CM

## 2013-11-06 DIAGNOSIS — R413 Other amnesia: Secondary | ICD-10-CM | POA: Diagnosis not present

## 2013-11-06 DIAGNOSIS — N189 Chronic kidney disease, unspecified: Secondary | ICD-10-CM | POA: Diagnosis not present

## 2013-11-06 NOTE — Telephone Encounter (Signed)
gv and printed appt sched and avs for pt for Feb 2016 °

## 2013-11-06 NOTE — Progress Notes (Signed)
  [facility] OFFICE PROGRESS NOTE   Diagnosis: CNS lymphoma  INTERVAL HISTORY:   [name] returns with her son. He reports she's had increased difficulty with memory recall. She recently fell over her dog at home. She continues to live alone. No seizures or new focal neurologic symptoms. Persistent gait ataxia.  Objective:  Vital signs in last 24 hours:  Blood pressure 118/56, pulse 85, temperature 97.1 F (36.2 C), temperature source Oral, resp. rate 18, height 5\' 4"  (1.626 m), weight 127 lb 14.4 oz (58.015 kg), SpO2 98.00%.    HEENT: Neck without mass Lymphatics: No cervical, supraclavicular, or axillary node Resp: Lungs clear bilaterally Cardio: Irregular GI: No hepatosplenomegaly Vascular: No leg edema Neuro: Alert, follows commands, the motor exam appears intact in the upper and lower extremities. The gait is unsteady. She trips to the right and left.    Medications: I have reviewed the patient's current medications.  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  a. 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  b. Initiation of weekly rituximab therapy 08/31/2010, status post week number 4 on 09/23/2010. c. Restaging MRI of the brain 10/07/2010 without evidence of disease progression. d. Initiation of every 2 month "maintenance" rituximab on 11/16/2010. Last treated on 09/15/2011 e. Restaging MRI of the brain 02/06/2011 unchanged aside from a slight increase in altered T2 signal in the subcortical region at the anterior left temporal lobe. f. Restaging MRI of the brain 07/07/2011-minimal change in appearance of T2 hyperintensity involving the anterior left temporal lobe g. Restaging MRI of the brain 01/15/2012-no significant change to suggest progression of tumor given limits of the unenhanced  study h. MRI of brain 03/03/2013 with stable postoperative changes. 2. Gait ataxia and memory deficit, question related to the central nervous system lymphoma. The ataxia appears stable today.  3. Hypertension. 4. Nonischemic cardiomyopathy. 5. Status post aortic valve replacement. 6. Transient ischemic attack in 2010. 7. Chronic renal insufficiency 8. Chronic exertional dyspnea-likely related to the cardiomyopathy.  Disposition:  She continues to have memory loss and gait ataxia. I suspect her symptoms are related to brain surgery and age-related memory loss. However she remains at high risk of developing progression of the CNS lymphoma. We decided to proceed with a restaging brain MRI.  I encouraged her to allow help in the home. I remain concerned it is unsafe for her to live alone. She and her son are most comfortable with her living at home and arrange for additional help.  She will return for an office visit in 4 months.  Betsy Coder, MD  11/06/2013  10:14 AM

## 2013-11-06 NOTE — Patient Instructions (Signed)
Would be helpful for you to get someone to help you at home with your medications and meal preparation.   Fall Prevention and Home Safety Falls cause injuries and can affect all age groups. It is possible to use preventive measures to significantly decrease the likelihood of falls. There are many simple measures which can make your home safer and prevent falls. OUTDOORS  Repair cracks and edges of walkways and driveways.  Remove high doorway thresholds.  Trim shrubbery on the main path into your home.  Have good outside lighting.  Clear walkways of tools, rocks, debris, and clutter.  Check that handrails are not broken and are securely fastened. Both sides of steps should have handrails.  Have leaves, snow, and ice cleared regularly.  Use sand or salt on walkways during winter months.  In the garage, clean up grease or oil spills. BATHROOM  Install night lights.  Install grab bars by the toilet and in the tub and shower.  Use non-skid mats or decals in the tub or shower.  Place a plastic non-slip stool in the shower to sit on, if needed.  Keep floors dry and clean up all water on the floor immediately.  Remove soap buildup in the tub or shower on a regular basis.  Secure bath mats with non-slip, double-sided rug tape.  Remove throw rugs and tripping hazards from the floors. BEDROOMS  Install night lights.  Make sure a bedside light is easy to reach.  Do not use oversized bedding.  Keep a telephone by your bedside.  Have a firm chair with side arms to use for getting dressed.  Remove throw rugs and tripping hazards from the floor. KITCHEN  Keep handles on pots and pans turned toward the center of the stove. Use back burners when possible.  Clean up spills quickly and allow time for drying.  Avoid walking on wet floors.  Avoid hot utensils and knives.  Position shelves so they are not too high or low.  Place commonly used objects within easy  reach.  If necessary, use a sturdy step stool with a grab bar when reaching.  Keep electrical cables out of the way.  Do not use floor polish or wax that makes floors slippery. If you must use wax, use non-skid floor wax.  Remove throw rugs and tripping hazards from the floor. STAIRWAYS  Never leave objects on stairs.  Place handrails on both sides of stairways and use them. Fix any loose handrails. Make sure handrails on both sides of the stairways are as long as the stairs.  Check carpeting to make sure it is firmly attached along stairs. Make repairs to worn or loose carpet promptly.  Avoid placing throw rugs at the top or bottom of stairways, or properly secure the rug with carpet tape to prevent slippage. Get rid of throw rugs, if possible.  Have an electrician put in a light switch at the top and bottom of the stairs. OTHER FALL PREVENTION TIPS  Wear low-heel or rubber-soled shoes that are supportive and fit well. Wear closed toe shoes.  When using a stepladder, make sure it is fully opened and both spreaders are firmly locked. Do not climb a closed stepladder.  Add color or contrast paint or tape to grab bars and handrails in your home. Place contrasting color strips on first and last steps.  Learn and use mobility aids as needed. Install an electrical emergency response system.  Turn on lights to avoid dark areas. Replace light bulbs that  burn out immediately. Get light switches that glow.  Arrange furniture to create clear pathways. Keep furniture in the same place.  Firmly attach carpet with non-skid or double-sided tape.  Eliminate uneven floor surfaces.  Select a carpet pattern that does not visually hide the edge of steps.  Be aware of all pets. OTHER HOME SAFETY TIPS  Set the water temperature for 120 F (48.8 C).  Keep emergency numbers on or near the telephone.  Keep smoke detectors on every level of the home and near sleeping areas. Document Released:  12/30/2001 Document Revised: 07/11/2011 Document Reviewed: 03/31/2011 [facility] Patient Information 2015 [place], [place]. This information is not intended to replace advice given to you by your health care provider. Make sure you discuss any questions you have with your health care provider.

## 2013-11-07 DIAGNOSIS — B351 Tinea unguium: Secondary | ICD-10-CM | POA: Diagnosis not present

## 2013-11-07 DIAGNOSIS — M79609 Pain in unspecified limb: Secondary | ICD-10-CM | POA: Diagnosis not present

## 2013-11-08 ENCOUNTER — Other Ambulatory Visit: Payer: Self-pay | Admitting: Cardiology

## 2013-11-14 ENCOUNTER — Telehealth: Payer: Self-pay | Admitting: Internal Medicine

## 2013-11-14 NOTE — Telephone Encounter (Signed)
Patient son would like to know if patient need any blood work done before her appointment? Please advise Phone # [phone]

## 2013-11-14 NOTE — Telephone Encounter (Signed)
Pt informed labs are in sytem. She can come a few days prior to her f/u OV with PCP.

## 2013-11-19 ENCOUNTER — Ambulatory Visit (HOSPITAL_COMMUNITY)
Admission: RE | Admit: 2013-11-19 | Discharge: 2013-11-19 | Disposition: A | Discharge status: Home / Self Care | Payer: Medicare Other | Source: Ambulatory Visit | Attending: Radiology | Admitting: Radiology

## 2013-11-19 DIAGNOSIS — Z9221 Personal history of antineoplastic chemotherapy: Secondary | ICD-10-CM | POA: Diagnosis not present

## 2013-11-19 DIAGNOSIS — C8580 Other specified types of non-Hodgkin lymphoma, unspecified site: Secondary | ICD-10-CM | POA: Insufficient documentation

## 2013-11-19 DIAGNOSIS — C8599 Non-Hodgkin lymphoma, unspecified, extranodal and solid organ sites: Secondary | ICD-10-CM

## 2013-11-19 DIAGNOSIS — C8591 Non-Hodgkin lymphoma, unspecified, lymph nodes of head, face, and neck: Secondary | ICD-10-CM | POA: Diagnosis not present

## 2013-11-19 DIAGNOSIS — I6782 Cerebral ischemia: Secondary | ICD-10-CM | POA: Diagnosis not present

## 2013-11-19 MED ORDER — GADOBENATE DIMEGLUMINE 529 MG/ML IV SOLN
15.0000 mL | Freq: Once | INTRAVENOUS | Status: AC | PRN
  Administered 2013-11-19: 11 mL via INTRAVENOUS

## 2013-11-21 ENCOUNTER — Other Ambulatory Visit: Payer: Self-pay

## 2013-11-21 ENCOUNTER — Telehealth: Payer: Self-pay | Admitting: *Deleted

## 2013-11-21 MED ORDER — METOPROLOL SUCCINATE ER 50 MG PO TB24: ORAL_TABLET | ORAL | Status: DC

## 2013-11-21 NOTE — Telephone Encounter (Signed)
Message copied by Brien Few on Fri Nov 21, 2013  9:52 AM ------      Message from: Betsy Coder B      Created: Wed Nov 19, 2013  8:30 PM       Please call son, MRI with no evidence of progressive cancer ------

## 2013-11-21 NOTE — Telephone Encounter (Signed)
Spoke with pt's son, No evidence of progressive cancer on MRI. He voiced understanding.

## 2013-11-24 ENCOUNTER — Other Ambulatory Visit: Payer: Self-pay | Admitting: Nurse Practitioner

## 2013-11-27 ENCOUNTER — Telehealth: Payer: Self-pay | Admitting: *Deleted

## 2013-11-27 NOTE — Telephone Encounter (Signed)
Has "severe" cold symptoms. Runny nose, cough and sinus congestion. Feels tired and achy. Asking if Dr. Benay Spice should see her or order antibiotic? She denies fever or dyspnea. Suggested she call her PCP, Dr. Alain Marion and follow his suggestions since she is not under chemo treatment. Gave her phone # for her PCP.

## 2013-12-01 ENCOUNTER — Telehealth: Payer: Self-pay | Admitting: *Deleted

## 2013-12-01 NOTE — Telephone Encounter (Signed)
No new orders received from dr. Benay Spice.  Instructions for her to call PCP.  Gave [name] the number with instructions on phone tree use to reach internal medicine at [employer].

## 2013-12-01 NOTE — Telephone Encounter (Signed)
   Provider input needed: stomach pain   Reason for call: "New pain to right navel area started this morning when I woke up"  Gastrointestinal: positive for abdominal pain   ALLERGIES:  has No Known Allergies.  Patient last received chemotherapy/ treatment on 09-15-2011 rituxamab  Patient was last seen in the office on 11-06-2013  Next appt is 03-19-2014  Is patient having fevers greater than 100.5?  no   Is patient having uncontrolled pain, or new pain? yes, Reports does not ever have pain, this is constant, started a few hours ago and doesn't know why.   Is patient having new back pain that changes with position (worsens or eases when laying down?)  no, "All I do is sit, I don't move around much"   Is patient able to eat and drink? yes    Is patient able to pass stool without difficulty?   yes, LBM was yesterday     Is patient having uncontrolled nausea?  no    patient calls 12/01/2013 with complaint of  Gastrointestinal: positive for abdominal pain   Summary Based on the above information advised patient to  Await return call to [phone] and to call PCP Dr. Judeen Hammans office back again    [name], Norma Pai  12/01/2013, 10:10 AM   [name]   DOB: 1919/04/01   MR#: [id number]   CSN#   [id number] 12/01/2013

## 2013-12-11 DIAGNOSIS — H3531 Nonexudative age-related macular degeneration: Secondary | ICD-10-CM | POA: Diagnosis not present

## 2013-12-12 ENCOUNTER — Ambulatory Visit: Payer: Medicare Other | Admitting: Internal Medicine

## 2013-12-15 ENCOUNTER — Other Ambulatory Visit: Payer: Self-pay | Admitting: Cardiology

## 2014-01-14 ENCOUNTER — Other Ambulatory Visit: Payer: Self-pay | Admitting: *Deleted

## 2014-01-14 ENCOUNTER — Encounter: Payer: Self-pay | Admitting: Cardiology

## 2014-01-14 ENCOUNTER — Encounter: Payer: Medicare Other | Admitting: Internal Medicine

## 2014-01-14 ENCOUNTER — Ambulatory Visit: Payer: Medicare Other | Admitting: Internal Medicine

## 2014-01-14 ENCOUNTER — Ambulatory Visit (INDEPENDENT_AMBULATORY_CARE_PROVIDER_SITE_OTHER): Payer: Medicare Other | Admitting: Cardiology

## 2014-01-14 VITALS — BP 130/70 | HR 78 | Ht 64.0 in | Wt 131.1 lb

## 2014-01-14 DIAGNOSIS — I5032 Chronic diastolic (congestive) heart failure: Secondary | ICD-10-CM

## 2014-01-14 MED ORDER — RAMIPRIL 2.5 MG PO CAPS: 2.5000 mg | ORAL_CAPSULE | Freq: Every day | ORAL | Status: DC

## 2014-01-14 NOTE — Patient Instructions (Signed)
Stop taking your metoprolol   Your physician recommends that you schedule a follow-up appointment in: 3 months with Dr. Percival Spanish

## 2014-01-14 NOTE — Progress Notes (Signed)
HPI The patient presents for evaluation of shortness of breath. She has a history of AVR in 2007.  She had a cardiomyopathy at that time with an EF of 25%. She had an echo last year. Her EF was well-preserved and her aortic valve replacement pericardial valve looked normal.  She presents for followup. She has no acute complaints although she continues to have dyspnea with some activities.  This seems to be unchanged from previous. She complains of fatigue. She's not describing PND or orthopnea. She's not describing new palpitations, presyncope or syncope. She's not having any chest pressure, neck or arm discomfort.  I   No Known Allergies  Current Outpatient Prescriptions  Medication Sig Dispense Refill  . Clobetasol Prop Emollient Base 0.05 % emollient cream Apply topically 2 (two) times daily.    . Cyanocobalamin (VITAMIN B-12) 1000 MCG SUBL Place 1 tablet (1,000 mcg total) under the tongue daily. 100 tablet 3  . fluticasone (FLONASE) 50 MCG/ACT nasal spray Place 2 sprays into both nostrils daily. 16 g 3  . folic acid (FOLVITE) 1 MG tablet TAKE 1 TABLET ONCE DAILY. 30 tablet 5  . furosemide (LASIX) 20 MG tablet Take 1 tablet (20 mg total) by mouth daily as needed. 30 tablet 6  . loratadine (CLARITIN) 10 MG tablet Take 1 tablet (10 mg total) by mouth daily.    . metoprolol succinate (TOPROL-XL) 50 MG 24 hr tablet TAKE 1 TABLET ONCE DAILY. 30 tablet 2  . ramipril (ALTACE) 2.5 MG capsule TAKE (1) CAPSULE DAILY. 30 capsule 0   No current facility-administered medications for this visit.    Past Medical History  Diagnosis Date  . Nonischemic cardiomyopathy 2007 / Feb 2009    Miminial non-obs CAD cath (2007); EF previously 25%;  55% by echo 2012;  Echo 4/14:  mild LVH; mod asymmetric hypertrophy of the septum, EF 55-60%, Gr 1 DD, AVR ok (mean gradient 9), MAC, mild LAE, PASP 31  . Aortic insufficiency October 2007    Severe with ascending thoracic aneurysm; s/p pericardial tissue AVR and  Heamsheild graft in October 2007; c/b pericardial effusion requiring window;  echo 03/2010: inferobasal HK, severe LVH, EF 55-60%, dynamic obstruction during Valsalva in mid cavity, peak velocity 232 cm/sec, peak gradient 22 mmHg, AVR ok, mod dilated aorta, mild LAE  . Hyperlipidemia   . Hypertension   . GERD (gastroesophageal reflux disease)   . History of cholelithiasis     Gallbladder dysfunction  . History of non anemic vitamin B12 deficiency   . Allergic rhinitis   . TIA (transient ischemic attack) October 2010  . Brain cancer   . CNS lymphoma   . History of recent fall 09/2013+    > 2 falls year    Past Surgical History  Procedure Laterality Date  . Tonsillectomy    . Aortic valve replacement  2007  . Bilateral vein stripping    . Left temporal tumor resected     ROS: A stated in the HPI and negative for all other systems.  PHYSICAL EXAM BP 130/70 mmHg  Pulse 78  Ht 5\' 4"  (1.626 m)  Wt 131 lb 1.6 oz (59.467 kg)  BMI 22.49 kg/m2 GENERAL:  Well appearing, looks younger than stated age NECK:  No jugular venous distention, waveform within normal limits, carotid upstroke brisk and symmetric, no bruits, no thyromegaly LYMPHATICS:  No cervical, inguinal adenopathy LUNGS:  Clear to auscultation bilaterally BACK:  No CVA tenderness CHEST:  Well healed sternotomy scar. HEART:  PMI not displaced or sustained,S1 and S2 within normal limits, no S3, no S4, no clicks, no rubs, no murmurs ABD:  Flat, positive bowel sounds normal in frequency in pitch, no bruits, no rebound, no guarding, no midline pulsatile mass, no hepatomegaly, no splenomegaly EXT:  2 plus pulses throughout, no edema, no cyanosis no clubbing  EKG:  Sinus rhythm, rate 78, left axis deviation, poor anterior R wave progression, premature ventricular contraction, no acute ST-T wave changes.  ASSESSMENT AND PLAN  AVR:  She had a normally functioning valve last year. No further imaging is indicated. No change in  therapy.  FATIGUE:  I think this is multifactorial and related to age. However, I'm going to discontinue her beta blocker to see if this might help.  DYSPNEA:  This seems to be chronic.  I don't think there is any worsening of this. No further workup is indicated.  HTN:  She's going to need to keep an eye blood pressure and talked about possibly keeping a diary as we come off the beta blocker.

## 2014-01-22 DIAGNOSIS — L603 Nail dystrophy: Secondary | ICD-10-CM | POA: Diagnosis not present

## 2014-01-22 DIAGNOSIS — I739 Peripheral vascular disease, unspecified: Secondary | ICD-10-CM | POA: Diagnosis not present

## 2014-03-16 ENCOUNTER — Ambulatory Visit (INDEPENDENT_AMBULATORY_CARE_PROVIDER_SITE_OTHER)
Admission: RE | Admit: 2014-03-16 | Discharge: 2014-03-16 | Disposition: A | Discharge status: Home / Self Care | Payer: Medicare Other | Source: Ambulatory Visit | Attending: Internal Medicine | Admitting: Internal Medicine

## 2014-03-16 ENCOUNTER — Ambulatory Visit (INDEPENDENT_AMBULATORY_CARE_PROVIDER_SITE_OTHER): Payer: Medicare Other | Admitting: Internal Medicine

## 2014-03-16 ENCOUNTER — Other Ambulatory Visit (INDEPENDENT_AMBULATORY_CARE_PROVIDER_SITE_OTHER): Payer: Medicare Other

## 2014-03-16 ENCOUNTER — Encounter: Payer: Self-pay | Admitting: Internal Medicine

## 2014-03-16 VITALS — BP 140/96 | HR 71 | Resp 16 | Ht 64.0 in | Wt 130.0 lb

## 2014-03-16 DIAGNOSIS — R0789 Other chest pain: Secondary | ICD-10-CM

## 2014-03-16 DIAGNOSIS — R0600 Dyspnea, unspecified: Secondary | ICD-10-CM | POA: Diagnosis not present

## 2014-03-16 DIAGNOSIS — R0609 Other forms of dyspnea: Secondary | ICD-10-CM

## 2014-03-16 DIAGNOSIS — R5383 Other fatigue: Secondary | ICD-10-CM

## 2014-03-16 DIAGNOSIS — I10 Essential (primary) hypertension: Secondary | ICD-10-CM

## 2014-03-16 LAB — TSH: TSH: 2.89 u[IU]/mL (ref 0.35–4.50)

## 2014-03-16 LAB — CBC WITH DIFFERENTIAL/PLATELET
BASOS PCT: 0.4 % (ref 0.0–3.0)
Basophils Absolute: 0 10*3/uL (ref 0.0–0.1)
Eosinophils Absolute: 0.2 10*3/uL (ref 0.0–0.7)
Eosinophils Relative: 3.7 % (ref 0.0–5.0)
HEMATOCRIT: 39.8 % (ref 36.0–46.0)
Hemoglobin: 13.6 g/dL (ref 12.0–15.0)
LYMPHS PCT: 11.9 % — AB (ref 12.0–46.0)
Lymphs Abs: 0.7 10*3/uL (ref 0.7–4.0)
MCHC: 34.2 g/dL (ref 30.0–36.0)
MCV: 100.3 fl — AB (ref 78.0–100.0)
MONO ABS: 0.7 10*3/uL (ref 0.1–1.0)
Monocytes Relative: 11.7 % (ref 3.0–12.0)
NEUTROS PCT: 72.3 % (ref 43.0–77.0)
Neutro Abs: 4.3 10*3/uL (ref 1.4–7.7)
PLATELETS: 236 10*3/uL (ref 150.0–400.0)
RBC: 3.97 Mil/uL (ref 3.87–5.11)
RDW: 13.1 % (ref 11.5–15.5)
WBC: 6 10*3/uL (ref 4.0–10.5)

## 2014-03-16 LAB — T4, FREE: Free T4: 0.81 ng/dL (ref 0.60–1.60)

## 2014-03-16 NOTE — Progress Notes (Signed)
Pre visit review using our clinic review tool, if applicable. No additional management support is needed unless otherwise documented below in the visit note. 

## 2014-03-16 NOTE — Progress Notes (Signed)
   Subjective:    Patient ID: [name], female    DOB: 1919-03-30, 81 y.o.   MRN: [id number]  HPI  She has been having chest pain in the left upper lateral chest intermittently since last week. She is unable to define the exact date of onset. There was no trigger or injury for this. It will typically last less than 30 seconds and is described as dull. It is not pleuritic in nature and is nonexertional.  She does describe dyspnea on exertion which is unrelated to chest pain.  She has stopped her Lasix because "it makes me pee too much".   She was seen by her Cardiologist 01/14/14 with shortness of breath and fatigue. Because of fatigue her beta blocker was discontinued; she was asked to monitor her blood pressure.  At that time her atrial valve replacement was felt to be stable based on an ECHOcardiogram from 2014. The aortic valve placement had been completed in 2007. She has a history of cardiomyopathy with ejection fraction of 25%.  Her last chest x-ray on record was 05/01/11.  She has history of B12 deficiency. The problem list includes chest pain, dyspnea, and fatigue as chronic, recurrent issues.  Last labs were 09/22/13. At that time creatinine was 1.3, BUN 25, glucose 103, and GFR 40.32. Lipids were good except HDL was mildly reduced at 35.7. She exhibited a macrocytosis with MCV 102.8 but B12 level was supernormal. TSH was slightly increased at 4.54.  Review of Systems She denies dyspepsia; dysphagia; abdominal pain of significance; unexplained weight loss; melena; rectal bleeding; cough; sputum production; or hemoptysis.  Despite stopping the Lasix she denies ankle edema or paroxysmal nocturnal dyspnea.  She has no claudication symptoms.  She does describe occasional postural dizziness.     Objective:   Physical Exam  Positive or pertinent findings include Facies are weathered. She has ptosis greater on the right than the left. The second heart sound is  increased. She has intermittent dry cough DIP DJD changes are present. Pedal pulses are decreased. Homans sign is negative.  General appearance :Thin but adequately nourished; in no distress. Eyes: No conjunctival inflammation or scleral icterus is present. Heart:  Normal rate and regular rhythm. S1 normal without gallop, murmur, click, rub or other extra sounds   Lungs:Chest clear to auscultation; no wheezes, rhonchi,rales ,or rubs present.No increased work of breathing.  Abdomen: bowel sounds normal, soft and non-tender without masses, organomegaly or hernias noted.  No guarding or rebound. No HJR Vascular : all pulses equal ; no bruits present. Skin:Warm & dry.  Intact without suspicious lesions or rashes ; no jaundice or tenting Lymphatic: No lymphadenopathy is noted about the head, neck, axilla Neuro: Strength, tone & DTRs normal. Oriented X 3 but she struggled with name of President     Assessment & Plan:  #1 chest pain; EKG is unchanged compared to 01/14/14 when seen by her cardiologist. She has inverted T waves in leads 1 and aVL. T wave is flat in V6. In December she did have a rare PVC.  #2 dyspnea  #3 fatigue  Plan: See orders and recommendations

## 2014-03-16 NOTE — Patient Instructions (Signed)
  Your next office appointment will be determined based upon review of your pending labs AND x-rays. Those instructions will be transmitted to you by mail. Critical values will be called. Followup as needed for any active or acute issue. Please report any significant change in your symptoms. 

## 2014-03-17 ENCOUNTER — Encounter: Payer: Self-pay | Admitting: Internal Medicine

## 2014-03-17 ENCOUNTER — Ambulatory Visit (INDEPENDENT_AMBULATORY_CARE_PROVIDER_SITE_OTHER): Payer: Medicare Other | Admitting: Internal Medicine

## 2014-03-17 ENCOUNTER — Telehealth: Payer: Self-pay

## 2014-03-17 ENCOUNTER — Other Ambulatory Visit (INDEPENDENT_AMBULATORY_CARE_PROVIDER_SITE_OTHER): Payer: Medicare Other

## 2014-03-17 VITALS — BP 110/80 | HR 105 | Ht 64.0 in

## 2014-03-17 DIAGNOSIS — F0390 Unspecified dementia without behavioral disturbance: Secondary | ICD-10-CM | POA: Diagnosis not present

## 2014-03-17 DIAGNOSIS — R739 Hyperglycemia, unspecified: Secondary | ICD-10-CM

## 2014-03-17 DIAGNOSIS — R7989 Other specified abnormal findings of blood chemistry: Secondary | ICD-10-CM

## 2014-03-17 DIAGNOSIS — C8599 Non-Hodgkin lymphoma, unspecified, extranodal and solid organ sites: Secondary | ICD-10-CM

## 2014-03-17 DIAGNOSIS — R079 Chest pain, unspecified: Secondary | ICD-10-CM

## 2014-03-17 DIAGNOSIS — R791 Abnormal coagulation profile: Secondary | ICD-10-CM

## 2014-03-17 DIAGNOSIS — C8580 Other specified types of non-Hodgkin lymphoma, unspecified site: Secondary | ICD-10-CM

## 2014-03-17 LAB — HEPATIC FUNCTION PANEL
ALBUMIN: 4.3 g/dL (ref 3.5–5.2)
ALT: 8 U/L (ref 0–35)
AST: 16 U/L (ref 0–37)
Alkaline Phosphatase: 79 U/L (ref 39–117)
Bilirubin, Direct: 0.2 mg/dL (ref 0.0–0.3)
Total Bilirubin: 1 mg/dL (ref 0.2–1.2)
Total Protein: 7.2 g/dL (ref 6.0–8.3)

## 2014-03-17 LAB — BASIC METABOLIC PANEL
BUN: 29 mg/dL — ABNORMAL HIGH (ref 6–23)
CALCIUM: 9.8 mg/dL (ref 8.4–10.5)
CO2: 27 meq/L (ref 19–32)
CREATININE: 1.19 mg/dL (ref 0.40–1.20)
Chloride: 100 mEq/L (ref 96–112)
GFR: 45 mL/min — AB (ref >60.00)
GLUCOSE: 105 mg/dL — AB (ref 70–99)
Potassium: 4.3 mEq/L (ref 3.5–5.1)
SODIUM: 137 meq/L (ref 135–145)

## 2014-03-17 LAB — D-DIMER, QUANTITATIVE: D-Dimer, Quant: 1.34 ug/mL-FEU — ABNORMAL HIGH (ref 0.00–0.48)

## 2014-03-17 LAB — HEMOGLOBIN A1C: Hgb A1c MFr Bld: 5.9 % (ref 4.6–6.5)

## 2014-03-17 NOTE — Progress Notes (Signed)
Pre visit review using our clinic review tool, if applicable. No additional management support is needed unless otherwise documented below in the visit note. 

## 2014-03-17 NOTE — Progress Notes (Signed)
   Subjective:    Patient ID: [name], female    DOB: 1919-02-16, 81 y.o.   MRN: [id number]  HPI  She came in to follow-up on the d-dimer elevation from 03/16/14. Unfortunately it is virtually impossible to get any meaningful history from her. She may have had some left axillary chest pain today ,possibly up to  level I.  She was accompanied by Demetrio Lapping [phone])  a friend of her son, Ronalee Belts [phone]) who lives in [place], [place].  She lives by herself with no supervision. She does have a medic alert bracelet/necklace which she is not wearing.  She has a history of B-cell lymphoma for which she's had a temporal mass resection.  She admits to memory deficits but states that this is new.  Review of Systems She may have had some chest discomfort 2/21; she is unclear as to this.    Objective:   Physical Exam  Pertinent or positive findings include: The most striking finding is her confusion. She is able to give [place] the year and the month but not the day of the month. Yesterday she was able to state that it was the 22nd. She is unable to give the name of the President or the [employer]. She also is unable to give the name of the state. She is pleasantly confused, intermittently laughing, and unperplexed by the queries. She has slightly irregular rhythm with slight flow murmur. No abnormal breath sounds are present. No L axillary rub.She has no increased work of breathing.  General appearance :adequately nourished; in no distress.Appears much younger than stated age Eyes: No conjunctival inflammation or scleral icterus is present. Heart:   S1 and S2 normal without gallop, click, rub or other extra sounds   Vascular : all pulses equal ; no bruits present. Skin:Warm & dry.  Intact without suspicious lesions or rashes ; no jaundice or tenting Lymphatic: No lymphadenopathy is noted about the head, neck, axilla Neuro: Strength, tone & DTRs normal.       Assessment &  Plan:  #1 left chest pain, atypical   #2 elevated d-dimer   #3 dementia  Plan: CT angiogram will be pursued 03/19/14 when her son will be here from [place].[name] novel anticoagulant was provided as 6 pills only. A request was made that administration of these be supervised. She was to avoid all aspirin or anti-inflammatory agents while on this.  Long-term this sweet lady is going to need supervision 24/72 to prevent a potential catastrophe.

## 2014-03-17 NOTE — Telephone Encounter (Signed)
Request for a1c add on has been faxed to lab

## 2014-03-17 NOTE — Telephone Encounter (Signed)
-----   Message from Hendricks Limes, MD sent at 03/17/2014  8:25 AM EST ----- Please add A1c (R73.9) Please do not mail out results; as you know she'll be here today

## 2014-03-17 NOTE — Patient Instructions (Addendum)
  Please start Xarelto 15 mg twice a day until we have ruled out a clot in the lung.No aspirin , ibuprofen, naproxen , etc while on Xarelto. Such a combination could cause serious bleeding.  Son: Legrand Como [phone] Friend: Demetrio Lapping [phone]

## 2014-03-19 ENCOUNTER — Telehealth: Payer: Self-pay | Admitting: Oncology

## 2014-03-19 ENCOUNTER — Ambulatory Visit (INDEPENDENT_AMBULATORY_CARE_PROVIDER_SITE_OTHER)
Admission: RE | Admit: 2014-03-19 | Discharge: 2014-03-19 | Disposition: A | Discharge status: Home / Self Care | Payer: Medicare Other | Source: Ambulatory Visit | Attending: Internal Medicine | Admitting: Internal Medicine

## 2014-03-19 ENCOUNTER — Ambulatory Visit (HOSPITAL_BASED_OUTPATIENT_CLINIC_OR_DEPARTMENT_OTHER): Payer: Medicare Other | Admitting: Oncology

## 2014-03-19 ENCOUNTER — Inpatient Hospital Stay: Admission: RE | Admit: 2014-03-19 | Payer: Medicare Other | Source: Ambulatory Visit

## 2014-03-19 VITALS — BP 151/97 | HR 83 | Temp 97.9°F | Resp 18 | Ht 64.0 in | Wt 130.2 lb

## 2014-03-19 DIAGNOSIS — C8339 Diffuse large B-cell lymphoma, extranodal and solid organ sites: Secondary | ICD-10-CM | POA: Diagnosis not present

## 2014-03-19 DIAGNOSIS — N189 Chronic kidney disease, unspecified: Secondary | ICD-10-CM

## 2014-03-19 DIAGNOSIS — R079 Chest pain, unspecified: Secondary | ICD-10-CM | POA: Diagnosis not present

## 2014-03-19 DIAGNOSIS — C8599 Non-Hodgkin lymphoma, unspecified, extranodal and solid organ sites: Secondary | ICD-10-CM

## 2014-03-19 DIAGNOSIS — R791 Abnormal coagulation profile: Secondary | ICD-10-CM | POA: Diagnosis not present

## 2014-03-19 DIAGNOSIS — R7989 Other specified abnormal findings of blood chemistry: Secondary | ICD-10-CM

## 2014-03-19 DIAGNOSIS — I712 Thoracic aortic aneurysm, without rupture: Secondary | ICD-10-CM | POA: Diagnosis not present

## 2014-03-19 MED ORDER — IOHEXOL 350 MG/ML SOLN
75.0000 mL | Freq: Once | INTRAVENOUS | Status: AC | PRN
  Administered 2014-03-19: 75 mL via INTRAVENOUS

## 2014-03-19 NOTE — Progress Notes (Signed)
[facility] OFFICE PROGRESS NOTE   Diagnosis: CNS lymphoma  INTERVAL HISTORY:   Ms. Haese returns as scheduled. She saw Dr. Linna Darner earlier suite for evaluation of chest pain. She was noted to have elevated d-dimer. She was referred for a CT of the chest today.  She continues to have balance difficulty and memory loss. She lives alone and has intermittent visits from friends and family.  Objective:  Vital signs in last 24 hours:  Blood pressure 151/97, pulse 83, temperature 97.9 F (36.6 C), temperature source Oral, resp. rate 18, height 5\' 4"  (1.626 m), weight 130 lb 3.2 oz (59.058 kg), SpO2 99 %.    Lymphatics:  No cervical, supraclavicular, or axillary nodes Resp:  Lungs clear bilaterally Cardio:  Regular rate and rhythm GI:  No hepatosplenomegaly Vascular:  No leg edema Neuro: alert, oriented to place and diagnosis, slightly unsteady gait, the motor exam appears intact in the upper and lower extremities     Lab Results:  Lab Results  Component Value Date   WBC 6.0 03/16/2014   HGB 13.6 03/16/2014   HCT 39.8 03/16/2014   MCV 100.3* 03/16/2014   PLT 236.0 03/16/2014   NEUTROABS 4.3 03/16/2014    Imaging:  Dg Chest 2 View  03/16/2014   CLINICAL DATA:  Left-sided chest pain, dyspnea on exertion  EXAM: CHEST  2 VIEW  COMPARISON:  05/01/2011  FINDINGS: Postsurgical changes are again seen. The cardiac shadow is stable. Aortic calcifications are again noted. Some mild prominence of the descending thoracic aorta is again noted. Cardiac shadows are within normal limits. No acute bony abnormality is noted.  IMPRESSION: Stable appearance of the chest when compared with the prior study.   Electronically Signed   By: Inez Catalina M.D.   On: 03/16/2014 17:41   Ct Angio Chest Pe W/cm &/or Wo Cm  03/19/2014   CLINICAL DATA:  Left-sided chest pain.  Elevated D-dimer level.  EXAM: CT ANGIOGRAPHY CHEST WITH CONTRAST  TECHNIQUE: Multidetector CT imaging of the chest was  performed using the standard protocol during bolus administration of intravenous contrast. Multiplanar CT image reconstructions and MIPs were obtained to evaluate the vascular anatomy.  CONTRAST:  72mL OMNIPAQUE IOHEXOL 350 MG/ML SOLN  COMPARISON:  CT scan of June 24, 2010.  FINDINGS: No pneumothorax or pleural effusion is noted. No acute pulmonary disease is noted. There is no evidence of pulmonary embolus. Aneurysmal dilatation of ascending thoracic aorta is noted with maximum measured diameter 4.9 cm which is not significantly changed compared to prior exam. There is also noted aneurysmal dilatation of the transverse aortic arch with maximum measured diameter of 5.0 cm, which is significantly increased compared to prior exam. Also noted is dilatation of descending thoracic aorta with maximum measured diameter 3.5 cm which is increased compared to prior exam. No mediastinal mass or adenopathy is noted. Visualized portion of upper abdomen appears normal. Sternotomy wires are noted.  Review of the MIP images confirms the above findings.  IMPRESSION: No evidence of pulmonary embolus.  Aneurysmal dilatation of ascending thoracic aorta is noted with maximum measured diameter of 4.9 cm which is not significantly changed compared to prior exam. Aneurysmal dilatation of transverse aortic arch is also noted with maximum measured diameter of 5.0 cm, which is significantly increased compared to prior exam of 2012.   Electronically Signed   By: Marijo Conception, M.D.   On: 03/19/2014 09:01    Medications: I have reviewed the patient's current medications.  Assessment/Plan: 1.  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  1. 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  2. Initiation of weekly rituximab therapy 08/31/2010, status post week number 4 on  09/23/2010. 3. Restaging MRI of the brain 10/07/2010 without evidence of disease progression. 4. Initiation of every 2 month "maintenance" rituximab on 11/16/2010. Last treated on 09/15/2011 5. Restaging MRI of the brain 02/06/2011 unchanged aside from a slight increase in altered T2 signal in the subcortical region at the anterior left temporal lobe. 6. Restaging MRI of the brain 07/07/2011-minimal change in appearance of T2 hyperintensity involving the anterior left temporal lobe 7. Restaging MRI of the brain 01/15/2012-no significant change to suggest progression of tumor given limits of the unenhanced study 8. MRI of brain 03/03/2013 with stable postoperative changes. 9. MRI brain 11/19/2013 with unchanged postoperative appearance of the left temporal lobe, no recurrent mass  2. Gait ataxia and memory deficit, question related to the central nervous system lymphoma. The ataxia appears stable today.  3. Hypertension. 4. Nonischemic cardiomyopathy. 5. Status post aortic valve replacement. 6. Transient ischemic attack in 2010. 7. Chronic renal insufficiency 8. Chronic exertional dyspnea-likely related to the cardiomyopathy.    Disposition:  There is no clinical evidence for progression of the CNS lymphoma. I suspect the memory loss is related to dementia. I remain concerned that she is living alone. Her son stated that he plans to check into assisted living, but she remains at home for now.  Ms. Dejoy will return for an office visit in 4 months. She has not taken her blood pressure medication today. She will discontinue xarelto with a negative chest CT.  Betsy Coder, MD  03/19/2014  10:10 AM

## 2014-03-19 NOTE — Telephone Encounter (Signed)
Gave avs & calendar for June. °

## 2014-03-20 ENCOUNTER — Other Ambulatory Visit: Payer: Self-pay | Admitting: Internal Medicine

## 2014-03-20 ENCOUNTER — Encounter: Payer: Medicare Other | Admitting: Internal Medicine

## 2014-03-20 DIAGNOSIS — I714 Abdominal aortic aneurysm, without rupture, unspecified: Secondary | ICD-10-CM | POA: Insufficient documentation

## 2014-04-02 DIAGNOSIS — B351 Tinea unguium: Secondary | ICD-10-CM | POA: Diagnosis not present

## 2014-04-02 DIAGNOSIS — M79609 Pain in unspecified limb: Secondary | ICD-10-CM | POA: Diagnosis not present

## 2014-04-04 NOTE — Progress Notes (Signed)
Please schedule an appt with me to discuss aortic root dilatation.

## 2014-04-06 NOTE — Progress Notes (Signed)
We will see her 04/30/14

## 2014-04-30 ENCOUNTER — Ambulatory Visit (INDEPENDENT_AMBULATORY_CARE_PROVIDER_SITE_OTHER): Payer: Medicare Other | Admitting: Cardiology

## 2014-04-30 ENCOUNTER — Encounter: Payer: Self-pay | Admitting: Cardiology

## 2014-04-30 VITALS — BP 102/74 | HR 84 | Ht 64.0 in | Wt 130.0 lb

## 2014-04-30 DIAGNOSIS — I10 Essential (primary) hypertension: Secondary | ICD-10-CM | POA: Diagnosis not present

## 2014-04-30 NOTE — Progress Notes (Signed)
HPI The patient presents for evaluation of shortness of breath. She has a history of AVR in 2007.  She had a cardiomyopathy at that time with an EF of 25%. She had an echo in 2014.  The EF was well-preserved and her aortic valve replacement pericardial valve looked normal.  She had a CT which was done and ordered by Dr. Linna Darner to evaluate some chest discomfort. There was no evidence of pulmonary embolism. However, the transverse aortic arch was 75 cm which was much bigger than previous. Her aortic root was 4.9 which was about the same. She came today to discuss this. She actually says she is doing quite well. She rarely has any chest discomfort. This is sporadic. She denies any new shortness of breath, PND or orthopnea. She's had no new palpitations, presyncope or syncope. She may rarely gets some lightheadedness. He still bowls.    I reviewed the results of the echocardiogram and a CT scan.   No Known Allergies  Current Outpatient Prescriptions  Medication Sig Dispense Refill  . Clobetasol Prop Emollient Base 0.05 % emollient cream Apply topically 2 (two) times daily.    . Cyanocobalamin (VITAMIN B-12) 1000 MCG SUBL Place 1 tablet (1,000 mcg total) under the tongue daily. 100 tablet 3  . fluticasone (FLONASE) 50 MCG/ACT nasal spray Place 2 sprays into both nostrils daily. 16 g 3  . folic acid (FOLVITE) 1 MG tablet TAKE 1 TABLET ONCE DAILY. 30 tablet 5  . furosemide (LASIX) 20 MG tablet Take 1 tablet (20 mg total) by mouth daily as needed. 30 tablet 6  . loratadine (CLARITIN) 10 MG tablet Take 1 tablet (10 mg total) by mouth daily.    . ramipril (ALTACE) 2.5 MG capsule Take 1 capsule (2.5 mg total) by mouth daily. 90 capsule 3   No current facility-administered medications for this visit.    Past Medical History  Diagnosis Date  . Nonischemic cardiomyopathy 2007 / Feb 2009    Miminial non-obs CAD cath (2007); EF previously 25%;  55% by echo 2012;  Echo 4/14:  mild LVH; mod asymmetric  hypertrophy of the septum, EF 55-60%, Gr 1 DD, AVR ok (mean gradient 9), MAC, mild LAE, PASP 31  . Aortic insufficiency October 2007    Severe with ascending thoracic aneurysm; s/p pericardial tissue AVR and Heamsheild graft in October 2007; c/b pericardial effusion requiring window;  echo 03/2010: inferobasal HK, severe LVH, EF 55-60%, dynamic obstruction during Valsalva in mid cavity, peak velocity 232 cm/sec, peak gradient 22 mmHg, AVR ok, mod dilated aorta, mild LAE  . Hyperlipidemia   . Hypertension   . GERD (gastroesophageal reflux disease)   . History of cholelithiasis     Gallbladder dysfunction  . History of non anemic vitamin B12 deficiency   . Allergic rhinitis   . TIA (transient ischemic attack) October 2010  . Brain cancer   . CNS lymphoma   . History of recent fall 09/2013+    > 2 falls year    Past Surgical History  Procedure Laterality Date  . Tonsillectomy    . Aortic valve replacement  2007  . Bilateral vein stripping    . Left temporal tumor resected      B cell lymphoma   ROS: A stated in the HPI and negative for all other systems.  PHYSICAL EXAM BP 102/74 mmHg  Pulse 84  Ht 5\' 4"  (1.626 m)  Wt 130 lb (58.968 kg)  BMI 22.30 kg/m2 GENERAL:  Well appearing, looks  younger than stated age NECK:  No jugular venous distention, waveform within normal limits, carotid upstroke brisk and symmetric, no bruits, no thyromegaly LYMPHATICS:  No cervical, inguinal adenopathy LUNGS:  Clear to auscultation bilaterally BACK:  No CVA tenderness CHEST:  Well healed sternotomy scar. HEART:  PMI not displaced or sustained,S1 and S2 within normal limits, no S3, no S4, no clicks, no rubs, no murmurs ABD:  Flat, positive bowel sounds normal in frequency in pitch, no bruits, no rebound, no guarding, no midline pulsatile mass, no hepatomegaly, no splenomegaly EXT:  2 plus pulses throughout, no edema, no cyanosis no clubbing  EKG:  Sinus rhythm, rate 84, left axis deviation, poor  anterior R wave progression, premature ventricular contraction, no acute ST-T wave changes, chronic lateral T-wave inversions.    ASSESSMENT AND PLAN  AVR:  She had a normally functioning valve last year. No further imaging is indicated. No change in therapy.  AORTIC ENLARGEMENT:  I had a long discussion with the patient and her son today. The transverse arch is not repairable in her situation. She would regardless not be a good surgical candidate with her advanced age. She's totally asymptomatic and has a wonderful quality of life and would certainly her this to any invasive procedures. No further imaging for this is indicated. We will follow her symptomatically.  HTN:  Her blood pressure is actually running a little bit low. However, it seems to fluctuate. She will continue on the meds as listed.

## 2014-04-30 NOTE — Patient Instructions (Signed)
Dr.Hochrein wants you to follow-up in: ONE YEAR You will receive a reminder letter in the mail two months in advance. If you don't receive a letter, please call our office to schedule the follow-up appointment.  

## 2014-05-01 ENCOUNTER — Other Ambulatory Visit (INDEPENDENT_AMBULATORY_CARE_PROVIDER_SITE_OTHER): Payer: Medicare Other

## 2014-05-01 ENCOUNTER — Encounter: Payer: Self-pay | Admitting: Internal Medicine

## 2014-05-01 ENCOUNTER — Ambulatory Visit (INDEPENDENT_AMBULATORY_CARE_PROVIDER_SITE_OTHER): Payer: Medicare Other | Admitting: Internal Medicine

## 2014-05-01 VITALS — BP 138/80 | HR 89 | Ht 64.0 in | Wt 131.0 lb

## 2014-05-01 DIAGNOSIS — E785 Hyperlipidemia, unspecified: Secondary | ICD-10-CM

## 2014-05-01 DIAGNOSIS — D518 Other vitamin B12 deficiency anemias: Secondary | ICD-10-CM | POA: Diagnosis not present

## 2014-05-01 DIAGNOSIS — I714 Abdominal aortic aneurysm, without rupture, unspecified: Secondary | ICD-10-CM

## 2014-05-01 DIAGNOSIS — Z0000 Encounter for general adult medical examination without abnormal findings: Secondary | ICD-10-CM | POA: Diagnosis not present

## 2014-05-01 DIAGNOSIS — I10 Essential (primary) hypertension: Secondary | ICD-10-CM

## 2014-05-01 LAB — HEPATIC FUNCTION PANEL
ALT: 10 U/L (ref 0–35)
AST: 19 U/L (ref 0–37)
Albumin: 4.2 g/dL (ref 3.5–5.2)
Alkaline Phosphatase: 85 U/L (ref 39–117)
Bilirubin, Direct: 0.2 mg/dL (ref 0.0–0.3)
Total Bilirubin: 0.8 mg/dL (ref 0.2–1.2)
Total Protein: 7.1 g/dL (ref 6.0–8.3)

## 2014-05-01 LAB — URINALYSIS, ROUTINE W REFLEX MICROSCOPIC
BILIRUBIN URINE: NEGATIVE
HGB URINE DIPSTICK: NEGATIVE
Ketones, ur: NEGATIVE
NITRITE: NEGATIVE
PH: 5.5 (ref 5.0–8.0)
Specific Gravity, Urine: 1.015 (ref 1.000–1.030)
TOTAL PROTEIN, URINE-UPE24: NEGATIVE
URINE GLUCOSE: NEGATIVE
Urobilinogen, UA: 0.2 (ref 0.0–1.0)

## 2014-05-01 LAB — BASIC METABOLIC PANEL
BUN: 44 mg/dL — ABNORMAL HIGH (ref 6–23)
CO2: 26 mEq/L (ref 19–32)
Calcium: 9.6 mg/dL (ref 8.4–10.5)
Chloride: 101 mEq/L (ref 96–112)
Creatinine, Ser: 1.36 mg/dL — ABNORMAL HIGH (ref 0.40–1.20)
GFR: 38.57 mL/min — ABNORMAL LOW (ref >60.00)
GLUCOSE: 124 mg/dL — AB (ref 70–99)
Potassium: 4.3 mEq/L (ref 3.5–5.1)
Sodium: 136 mEq/L (ref 135–145)

## 2014-05-01 LAB — CBC WITH DIFFERENTIAL/PLATELET
BASOS ABS: 0 10*3/uL (ref 0.0–0.1)
Basophils Relative: 0.4 % (ref 0.0–3.0)
EOS PCT: 3.6 % (ref 0.0–5.0)
Eosinophils Absolute: 0.2 10*3/uL (ref 0.0–0.7)
HCT: 38.2 % (ref 36.0–46.0)
Hemoglobin: 13.1 g/dL (ref 12.0–15.0)
LYMPHS PCT: 12.1 % (ref 12.0–46.0)
Lymphs Abs: 0.8 10*3/uL (ref 0.7–4.0)
MCHC: 34.2 g/dL (ref 30.0–36.0)
MCV: 99.1 fl (ref 78.0–100.0)
MONO ABS: 0.7 10*3/uL (ref 0.1–1.0)
Monocytes Relative: 11.3 % (ref 3.0–12.0)
NEUTROS PCT: 72.6 % (ref 43.0–77.0)
Neutro Abs: 4.5 10*3/uL (ref 1.4–7.7)
PLATELETS: 205 10*3/uL (ref 150.0–400.0)
RBC: 3.86 Mil/uL — AB (ref 3.87–5.11)
RDW: 13.1 % (ref 11.5–15.5)
WBC: 6.3 10*3/uL (ref 4.0–10.5)

## 2014-05-01 LAB — TSH: TSH: 1.76 u[IU]/mL (ref 0.35–4.50)

## 2014-05-01 LAB — VITAMIN B12: VITAMIN B 12: 302 pg/mL (ref 211–911)

## 2014-05-01 MED ORDER — IPRATROPIUM BROMIDE 0.06 % NA SOLN: 2.0000 | Freq: Three times a day (TID) | NASAL | Status: DC

## 2014-05-01 MED ORDER — VITAMIN D 1000 UNITS PO TABS: 1000.0000 [IU] | ORAL_TABLET | Freq: Every day | ORAL | Status: DC

## 2014-05-01 NOTE — Progress Notes (Signed)
   Subjective:    HPI  The patient is here for a wellness exam.  Comes in w/her son Ronalee Belts  The patient presents for a follow-up of  chronic hypertension, brain lymphoma, CHF controlled with medicines, pernicious anemia (pt stopped B12). Active in a bowling league.   BP Readings from Last 3 Encounters:  05/01/14 138/80  04/30/14 102/74  03/19/14 151/97    Wt Readings from Last 3 Encounters:  05/01/14 131 lb (59.421 kg)  04/30/14 130 lb (58.968 kg)  03/19/14 130 lb 3.2 oz (59.058 kg)     Review of Systems  Constitutional: Positive for fatigue. Negative for chills, activity change, appetite change and unexpected weight change.  HENT: Negative for congestion, mouth sores and sinus pressure.   Eyes: Negative for visual disturbance.  Respiratory: Negative for cough, chest tightness and wheezing.   Gastrointestinal: Negative for nausea and abdominal pain.  Genitourinary: Negative for frequency, difficulty urinating and vaginal pain.  Musculoskeletal: Negative for back pain and gait problem.  Skin: Negative for pallor, rash and wound.  Neurological: Negative for dizziness, tremors, seizures, weakness, numbness and headaches.  Psychiatric/Behavioral: Negative for suicidal ideas, hallucinations, confusion and sleep disturbance. The patient is nervous/anxious.        Objective:   Physical Exam  Constitutional: She appears well-developed. No distress.  Looks younger than her stated age NAD  HENT:  Head: Normocephalic.  Right Ear: External ear normal.  Left Ear: External ear normal.  Nose: Nose normal.  Mouth/Throat: Oropharynx is clear and moist.  Eyes: Conjunctivae are normal. Pupils are equal, round, and reactive to light. Right eye exhibits no discharge. Left eye exhibits no discharge.  Neck: Normal range of motion. Neck supple. No JVD present. No tracheal deviation present. No thyromegaly present.  Cardiovascular: Normal rate, regular rhythm and normal heart sounds.     Pulmonary/Chest: No stridor. No respiratory distress. She has no wheezes.  Abdominal: Soft. Bowel sounds are normal. She exhibits no distension and no mass. There is no tenderness. There is no rebound and no guarding.  Musculoskeletal: She exhibits no edema or tenderness.  Lymphadenopathy:    She has no cervical adenopathy.  Neurological: She displays normal reflexes. No cranial nerve deficit. She exhibits normal muscle tone. Coordination abnormal.  Skin: No rash noted. No erythema.  Psychiatric: She has a normal mood and affect. Her behavior is normal. Judgment and thought content normal.    Lab Results  Component Value Date   WBC 6.0 03/16/2014   HGB 13.6 03/16/2014   HCT 39.8 03/16/2014   PLT 236.0 03/16/2014   GLUCOSE 105* 03/16/2014   CHOL 153 09/22/2013   TRIG 138.0 09/22/2013   HDL 35.70* 09/22/2013   LDLDIRECT 130.1 04/01/2010   LDLCALC 90 09/22/2013   ALT 8 03/16/2014   AST 16 03/16/2014   NA 137 03/16/2014   K 4.3 03/16/2014   CL 100 03/16/2014   CREATININE 1.19 03/16/2014   BUN 29* 03/16/2014   CO2 27 03/16/2014   TSH 2.89 03/16/2014   INR 0.98 11/11/2008   HGBA1C 5.9 03/17/2014         Assessment & Plan:

## 2014-05-01 NOTE — Assessment & Plan Note (Signed)
Monitoring [place] Ramipril

## 2014-05-01 NOTE — Assessment & Plan Note (Signed)
Here for medicare wellness/physical  Diet: heart healthy  Physical activity: not sedentary  Depression/mood screen: negative  Hearing: intact to whispered voice  Visual acuity: grossly normal, performs annual eye exam  ADLs: capable  Fall risk: none  Home safety: good  Cognitive evaluation: intact to orientation, naming, recall and repetition  EOL planning: adv directives, full code/ I agree  I have personally reviewed and have noted  1. The patient's medical, surgical and social history  2. Their use of alcohol, tobacco or illicit drugs  3. Their current medications and supplements  4. The patient's functional ability including ADL's, fall risks, home safety risks and hearing or visual impairment.  5. Diet and physical activities  6. Evidence for depression or mood disorders 7. Roster of doctors was reviewed    Today patient counseled on age appropriate routine health concerns for screening and prevention, each reviewed and up to date or declined. Immunizations reviewed and up to date or declined. Labs ordered and reviewed. Risk factors for depression reviewed and negative. Hearing function and visual acuity are intact. ADLs screened and addressed as needed. Functional ability and level of safety reviewed and appropriate. Education, counseling and referrals performed based on assessed risks today. Patient provided with a copy of personalized plan for preventive services.

## 2014-05-01 NOTE — Progress Notes (Signed)
Pre visit review using our clinic review tool, if applicable. No additional management support is needed unless otherwise documented below in the visit note. 

## 2014-05-01 NOTE — Patient Instructions (Signed)
Preventive Care for Adults A healthy lifestyle and preventive care can promote health and wellness. Preventive health guidelines for women include the following key practices.  A routine yearly physical is a good way to check with your health care provider about your health and preventive screening. It is a chance to share any concerns and updates on your health and to receive a thorough exam.  Visit your dentist for a routine exam and preventive care every 6 months. Brush your teeth twice a day and floss once a day. Good oral hygiene prevents tooth decay and gum disease.  The frequency of eye exams is based on your age, health, family medical history, use of contact lenses, and other factors. Follow your health care provider's recommendations for frequency of eye exams.  Eat a healthy diet. Foods like vegetables, fruits, whole grains, low-fat dairy products, and lean protein foods contain the nutrients you need without too many calories. Decrease your intake of foods high in solid fats, added sugars, and salt. Eat the right amount of calories for you.Get information about a proper diet from your health care provider, if necessary.  Regular physical exercise is one of the most important things you can do for your health. Most adults should get at least 150 minutes of moderate-intensity exercise (any activity that increases your heart rate and causes you to sweat) each week. In addition, most adults need muscle-strengthening exercises on 2 or more days a week.  Maintain a healthy weight. The body mass index (BMI) is a screening tool to identify possible weight problems. It provides an estimate of body fat based on height and weight. Your health care provider can find your BMI and can help you achieve or maintain a healthy weight.For adults 20 years and older:  A BMI below 18.5 is considered underweight.  A BMI of 18.5 to 24.9 is normal.  A BMI of 25 to 29.9 is considered overweight.  A BMI of  30 and above is considered obese.  Maintain normal blood lipids and cholesterol levels by exercising and minimizing your intake of saturated fat. Eat a balanced diet with plenty of fruit and vegetables. Blood tests for lipids and cholesterol should begin at age 76 and be repeated every 5 years. If your lipid or cholesterol levels are high, you are over 50, or you are at high risk for heart disease, you may need your cholesterol levels checked more frequently.Ongoing high lipid and cholesterol levels should be treated with medicines if diet and exercise are not working.  If you smoke, find out from your health care provider how to quit. If you do not use tobacco, do not start.  Lung cancer screening is recommended for adults aged 22-80 years who are at high risk for developing lung cancer because of a history of smoking. A yearly low-dose CT scan of the lungs is recommended for people who have at least a 30-pack-year history of smoking and are a current smoker or have quit within the past 15 years. A pack year of smoking is smoking an average of 1 pack of cigarettes a day for 1 year (for example: 1 pack a day for 30 years or 2 packs a day for 15 years). Yearly screening should continue until the smoker has stopped smoking for at least 15 years. Yearly screening should be stopped for people who develop a health problem that would prevent them from having lung cancer treatment.  If you are pregnant, do not drink alcohol. If you are breastfeeding,  be very cautious about drinking alcohol. If you are not pregnant and choose to drink alcohol, do not have more than 1 drink per day. One drink is considered to be 12 ounces (355 mL) of beer, 5 ounces (148 mL) of wine, or 1.5 ounces (44 mL) of liquor.  Avoid use of street drugs. Do not share needles with anyone. Ask for help if you need support or instructions about stopping the use of drugs.  High blood pressure causes heart disease and increases the risk of  stroke. Your blood pressure should be checked at least every 1 to 2 years. Ongoing high blood pressure should be treated with medicines if weight loss and exercise do not work.  If you are 75-52 years old, ask your health care provider if you should take aspirin to prevent strokes.  Diabetes screening involves taking a blood sample to check your fasting blood sugar level. This should be done once every 3 years, after age 15, if you are within normal weight and without risk factors for diabetes. Testing should be considered at a younger age or be carried out more frequently if you are overweight and have at least 1 risk factor for diabetes.  Breast cancer screening is essential preventive care for women. You should practice "breast self-awareness." This means understanding the normal appearance and feel of your breasts and may include breast self-examination. Any changes detected, no matter how small, should be reported to a health care provider. Women in their 58s and 30s should have a clinical breast exam (CBE) by a health care provider as part of a regular health exam every 1 to 3 years. After age 16, women should have a CBE every year. Starting at age 53, women should consider having a mammogram (breast X-ray test) every year. Women who have a family history of breast cancer should talk to their health care provider about genetic screening. Women at a high risk of breast cancer should talk to their health care providers about having an MRI and a mammogram every year.  Breast cancer gene (BRCA)-related cancer risk assessment is recommended for women who have family members with BRCA-related cancers. BRCA-related cancers include breast, ovarian, tubal, and peritoneal cancers. Having family members with these cancers may be associated with an increased risk for harmful changes (mutations) in the breast cancer genes BRCA1 and BRCA2. Results of the assessment will determine the need for genetic counseling and  BRCA1 and BRCA2 testing.  Routine pelvic exams to screen for cancer are no longer recommended for nonpregnant women who are considered low risk for cancer of the pelvic organs (ovaries, uterus, and vagina) and who do not have symptoms. Ask your health care provider if a screening pelvic exam is right for you.  If you have had past treatment for cervical cancer or a condition that could lead to cancer, you need Pap tests and screening for cancer for at least 20 years after your treatment. If Pap tests have been discontinued, your risk factors (such as having a new sexual partner) need to be reassessed to determine if screening should be resumed. Some women have medical problems that increase the chance of getting cervical cancer. In these cases, your health care provider may recommend more frequent screening and Pap tests.  The HPV test is an additional test that may be used for cervical cancer screening. The HPV test looks for the virus that can cause the cell changes on the cervix. The cells collected during the Pap test can be  tested for HPV. The HPV test could be used to screen women aged 30 years and older, and should be used in women of any age who have unclear Pap test results. After the age of 30, women should have HPV testing at the same frequency as a Pap test.  Colorectal cancer can be detected and often prevented. Most routine colorectal cancer screening begins at the age of 50 years and continues through age 75 years. However, your health care provider may recommend screening at an earlier age if you have risk factors for colon cancer. On a yearly basis, your health care provider may provide home test kits to check for hidden blood in the stool. Use of a small camera at the end of a tube, to directly examine the colon (sigmoidoscopy or colonoscopy), can detect the earliest forms of colorectal cancer. Talk to your health care provider about this at age 50, when routine screening begins. Direct  exam of the colon should be repeated every 5-10 years through age 75 years, unless early forms of pre-cancerous polyps or small growths are found.  People who are at an increased risk for hepatitis B should be screened for this virus. You are considered at high risk for hepatitis B if:  You were born in a country where hepatitis B occurs often. Talk with your health care provider about which countries are considered high risk.  Your parents were born in a high-risk country and you have not received a shot to protect against hepatitis B (hepatitis B vaccine).  You have HIV or AIDS.  You use needles to inject street drugs.  You live with, or have sex with, someone who has hepatitis B.  You get hemodialysis treatment.  You take certain medicines for conditions like cancer, organ transplantation, and autoimmune conditions.  Hepatitis C blood testing is recommended for all people born from 1945 through 1965 and any individual with known risks for hepatitis C.  Practice safe sex. Use condoms and avoid high-risk sexual practices to reduce the spread of sexually transmitted infections (STIs). STIs include gonorrhea, chlamydia, syphilis, trichomonas, herpes, HPV, and human immunodeficiency virus (HIV). Herpes, HIV, and HPV are viral illnesses that have no cure. They can result in disability, cancer, and death.  You should be screened for sexually transmitted illnesses (STIs) including gonorrhea and chlamydia if:  You are sexually active and are younger than 24 years.  You are older than 24 years and your health care provider tells you that you are at risk for this type of infection.  Your sexual activity has changed since you were last screened and you are at an increased risk for chlamydia or gonorrhea. Ask your health care provider if you are at risk.  If you are at risk of being infected with HIV, it is recommended that you take a prescription medicine daily to prevent HIV infection. This is  called preexposure prophylaxis (PrEP). You are considered at risk if:  You are a heterosexual woman, are sexually active, and are at increased risk for HIV infection.  You take drugs by injection.  You are sexually active with a partner who has HIV.  Talk with your health care provider about whether you are at high risk of being infected with HIV. If you choose to begin PrEP, you should first be tested for HIV. You should then be tested every 3 months for as long as you are taking PrEP.  Osteoporosis is a disease in which the bones lose minerals and strength   with aging. This can result in serious bone fractures or breaks. The risk of osteoporosis can be identified using a bone density scan. Women ages 65 years and over and women at risk for fractures or osteoporosis should discuss screening with their health care providers. Ask your health care provider whether you should take a calcium supplement or vitamin D to reduce the rate of osteoporosis.  Menopause can be associated with physical symptoms and risks. Hormone replacement therapy is available to decrease symptoms and risks. You should talk to your health care provider about whether hormone replacement therapy is right for you.  Use sunscreen. Apply sunscreen liberally and repeatedly throughout the day. You should seek shade when your shadow is shorter than you. Protect yourself by wearing long sleeves, pants, a wide-brimmed hat, and sunglasses year round, whenever you are outdoors.  Once a month, do a whole body skin exam, using a mirror to look at the skin on your back. Tell your health care provider of new moles, moles that have irregular borders, moles that are larger than a pencil eraser, or moles that have changed in shape or color.  Stay current with required vaccines (immunizations).  Influenza vaccine. All adults should be immunized every year.  Tetanus, diphtheria, and acellular pertussis (Td, Tdap) vaccine. Pregnant women should  receive 1 dose of Tdap vaccine during each pregnancy. The dose should be obtained regardless of the length of time since the last dose. Immunization is preferred during the 27th-36th week of gestation. An adult who has not previously received Tdap or who does not know her vaccine status should receive 1 dose of Tdap. This initial dose should be followed by tetanus and diphtheria toxoids (Td) booster doses every 10 years. Adults with an unknown or incomplete history of completing a 3-dose immunization series with Td-containing vaccines should begin or complete a primary immunization series including a Tdap dose. Adults should receive a Td booster every 10 years.  Varicella vaccine. An adult without evidence of immunity to varicella should receive 2 doses or a second dose if she has previously received 1 dose. Pregnant females who do not have evidence of immunity should receive the first dose after pregnancy. This first dose should be obtained before leaving the health care facility. The second dose should be obtained 4-8 weeks after the first dose.  Human papillomavirus (HPV) vaccine. Females aged 13-26 years who have not received the vaccine previously should obtain the 3-dose series. The vaccine is not recommended for use in pregnant females. However, pregnancy testing is not needed before receiving a dose. If a female is found to be pregnant after receiving a dose, no treatment is needed. In that case, the remaining doses should be delayed until after the pregnancy. Immunization is recommended for any person with an immunocompromised condition through the age of 26 years if she did not get any or all doses earlier. During the 3-dose series, the second dose should be obtained 4-8 weeks after the first dose. The third dose should be obtained 24 weeks after the first dose and 16 weeks after the second dose.  Zoster vaccine. One dose is recommended for adults aged 60 years or older unless certain conditions are  present.  Measles, mumps, and rubella (MMR) vaccine. Adults born before 1957 generally are considered immune to measles and mumps. Adults born in 1957 or later should have 1 or more doses of MMR vaccine unless there is a contraindication to the vaccine or there is laboratory evidence of immunity to   each of the three diseases. A routine second dose of MMR vaccine should be obtained at least 28 days after the first dose for students attending postsecondary schools, health care workers, or international travelers. People who received inactivated measles vaccine or an unknown type of measles vaccine during 1963-1967 should receive 2 doses of MMR vaccine. People who received inactivated mumps vaccine or an unknown type of mumps vaccine before 1979 and are at high risk for mumps infection should consider immunization with 2 doses of MMR vaccine. For females of childbearing age, rubella immunity should be determined. If there is no evidence of immunity, females who are not pregnant should be vaccinated. If there is no evidence of immunity, females who are pregnant should delay immunization until after pregnancy. Unvaccinated health care workers born before 1957 who lack laboratory evidence of measles, mumps, or rubella immunity or laboratory confirmation of disease should consider measles and mumps immunization with 2 doses of MMR vaccine or rubella immunization with 1 dose of MMR vaccine.  Pneumococcal 13-valent conjugate (PCV13) vaccine. When indicated, a person who is uncertain of her immunization history and has no record of immunization should receive the PCV13 vaccine. An adult aged 19 years or older who has certain medical conditions and has not been previously immunized should receive 1 dose of PCV13 vaccine. This PCV13 should be followed with a dose of pneumococcal polysaccharide (PPSV23) vaccine. The PPSV23 vaccine dose should be obtained at least 8 weeks after the dose of PCV13 vaccine. An adult aged 19  years or older who has certain medical conditions and previously received 1 or more doses of PPSV23 vaccine should receive 1 dose of PCV13. The PCV13 vaccine dose should be obtained 1 or more years after the last PPSV23 vaccine dose.  Pneumococcal polysaccharide (PPSV23) vaccine. When PCV13 is also indicated, PCV13 should be obtained first. All adults aged 65 years and older should be immunized. An adult younger than age 65 years who has certain medical conditions should be immunized. Any person who resides in a nursing home or long-term care facility should be immunized. An adult smoker should be immunized. People with an immunocompromised condition and certain other conditions should receive both PCV13 and PPSV23 vaccines. People with human immunodeficiency virus (HIV) infection should be immunized as soon as possible after diagnosis. Immunization during chemotherapy or radiation therapy should be avoided. Routine use of PPSV23 vaccine is not recommended for American Indians, [place] Natives, or people younger than 65 years unless there are medical conditions that require PPSV23 vaccine. When indicated, people who have unknown immunization and have no record of immunization should receive PPSV23 vaccine. One-time revaccination 5 years after the first dose of PPSV23 is recommended for people aged 19-64 years who have chronic kidney failure, nephrotic syndrome, asplenia, or immunocompromised conditions. People who received 1-2 doses of PPSV23 before age 65 years should receive another dose of PPSV23 vaccine at age 65 years or later if at least 5 years have passed since the previous dose. Doses of PPSV23 are not needed for people immunized with PPSV23 at or after age 65 years.  Meningococcal vaccine. Adults with asplenia or persistent complement component deficiencies should receive 2 doses of quadrivalent meningococcal conjugate (MenACWY-D) vaccine. The doses should be obtained at least 2 months apart.  Microbiologists working with certain meningococcal bacteria, military recruits, people at risk during an outbreak, and people who travel to or live in countries with a high rate of meningitis should be immunized. A first-year college student up through age   21 years who is living in a residence hall should receive a dose if she did not receive a dose on or after her 16th birthday. Adults who have certain high-risk conditions should receive one or more doses of vaccine.  Hepatitis A vaccine. Adults who wish to be protected from this disease, have certain high-risk conditions, work with hepatitis A-infected animals, work in hepatitis A research labs, or travel to or work in countries with a high rate of hepatitis A should be immunized. Adults who were previously unvaccinated and who anticipate close contact with an international adoptee during the first 60 days after arrival in the [place] from a country with a high rate of hepatitis A should be immunized.  Hepatitis B vaccine. Adults who wish to be protected from this disease, have certain high-risk conditions, may be exposed to blood or other infectious body fluids, are household contacts or sex partners of hepatitis B positive people, are clients or workers in certain care facilities, or travel to or work in countries with a high rate of hepatitis B should be immunized.  Haemophilus influenzae type b (Hib) vaccine. A previously unvaccinated person with asplenia or sickle cell disease or having a scheduled splenectomy should receive 1 dose of Hib vaccine. Regardless of previous immunization, a recipient of a hematopoietic stem cell transplant should receive a 3-dose series 6-12 months after her successful transplant. Hib vaccine is not recommended for adults with HIV infection. Preventive Services / Frequency Ages 64 to 68 years  Blood pressure check.** / Every 1 to 2 years.  Lipid and cholesterol check.** / Every 5 years beginning at age  22.  Clinical breast exam.** / Every 3 years for women in their 88s and 53s.  BRCA-related cancer risk assessment.** / For women who have family members with a BRCA-related cancer (breast, ovarian, tubal, or peritoneal cancers).  Pap test.** / Every 2 years from ages 90 through 51. Every 3 years starting at age 21 through age 56 or 3 with a history of 3 consecutive normal Pap tests.  HPV screening.** / Every 3 years from ages 24 through ages 1 to 46 with a history of 3 consecutive normal Pap tests.  Hepatitis C blood test.** / For any individual with known risks for hepatitis C.  Skin self-exam. / Monthly.  Influenza vaccine. / Every year.  Tetanus, diphtheria, and acellular pertussis (Tdap, Td) vaccine.** / Consult your health care provider. Pregnant women should receive 1 dose of Tdap vaccine during each pregnancy. 1 dose of Td every 10 years.  Varicella vaccine.** / Consult your health care provider. Pregnant females who do not have evidence of immunity should receive the first dose after pregnancy.  HPV vaccine. / 3 doses over 6 months, if 72 and younger. The vaccine is not recommended for use in pregnant females. However, pregnancy testing is not needed before receiving a dose.  Measles, mumps, rubella (MMR) vaccine.** / You need at least 1 dose of MMR if you were born in 1957 or later. You may also need a 2nd dose. For females of childbearing age, rubella immunity should be determined. If there is no evidence of immunity, females who are not pregnant should be vaccinated. If there is no evidence of immunity, females who are pregnant should delay immunization until after pregnancy.  Pneumococcal 13-valent conjugate (PCV13) vaccine.** / Consult your health care provider.  Pneumococcal polysaccharide (PPSV23) vaccine.** / 1 to 2 doses if you smoke cigarettes or if you have certain conditions.  Meningococcal vaccine.** /  1 dose if you are age 19 to 21 years and a first-year college  student living in a residence hall, or have one of several medical conditions, you need to get vaccinated against meningococcal disease. You may also need additional booster doses.  Hepatitis A vaccine.** / Consult your health care provider.  Hepatitis B vaccine.** / Consult your health care provider.  Haemophilus influenzae type b (Hib) vaccine.** / Consult your health care provider. Ages 40 to 64 years  Blood pressure check.** / Every 1 to 2 years.  Lipid and cholesterol check.** / Every 5 years beginning at age 20 years.  Lung cancer screening. / Every year if you are aged 55-80 years and have a 30-pack-year history of smoking and currently smoke or have quit within the past 15 years. Yearly screening is stopped once you have quit smoking for at least 15 years or develop a health problem that would prevent you from having lung cancer treatment.  Clinical breast exam.** / Every year after age 40 years.  BRCA-related cancer risk assessment.** / For women who have family members with a BRCA-related cancer (breast, ovarian, tubal, or peritoneal cancers).  Mammogram.** / Every year beginning at age 40 years and continuing for as long as you are in good health. Consult with your health care provider.  Pap test.** / Every 3 years starting at age 30 years through age 65 or 70 years with a history of 3 consecutive normal Pap tests.  HPV screening.** / Every 3 years from ages 30 years through ages 65 to 70 years with a history of 3 consecutive normal Pap tests.  Fecal occult blood test (FOBT) of stool. / Every year beginning at age 50 years and continuing until age 75 years. You may not need to do this test if you get a colonoscopy every 10 years.  Flexible sigmoidoscopy or colonoscopy.** / Every 5 years for a flexible sigmoidoscopy or every 10 years for a colonoscopy beginning at age 50 years and continuing until age 75 years.  Hepatitis C blood test.** / For all people born from 1945 through  1965 and any individual with known risks for hepatitis C.  Skin self-exam. / Monthly.  Influenza vaccine. / Every year.  Tetanus, diphtheria, and acellular pertussis (Tdap/Td) vaccine.** / Consult your health care provider. Pregnant women should receive 1 dose of Tdap vaccine during each pregnancy. 1 dose of Td every 10 years.  Varicella vaccine.** / Consult your health care provider. Pregnant females who do not have evidence of immunity should receive the first dose after pregnancy.  Zoster vaccine.** / 1 dose for adults aged 60 years or older.  Measles, mumps, rubella (MMR) vaccine.** / You need at least 1 dose of MMR if you were born in 1957 or later. You may also need a 2nd dose. For females of childbearing age, rubella immunity should be determined. If there is no evidence of immunity, females who are not pregnant should be vaccinated. If there is no evidence of immunity, females who are pregnant should delay immunization until after pregnancy.  Pneumococcal 13-valent conjugate (PCV13) vaccine.** / Consult your health care provider.  Pneumococcal polysaccharide (PPSV23) vaccine.** / 1 to 2 doses if you smoke cigarettes or if you have certain conditions.  Meningococcal vaccine.** / Consult your health care provider.  Hepatitis A vaccine.** / Consult your health care provider.  Hepatitis B vaccine.** / Consult your health care provider.  Haemophilus influenzae type b (Hib) vaccine.** / Consult your health care provider. Ages 65   years and over  Blood pressure check.** / Every 1 to 2 years.  Lipid and cholesterol check.** / Every 5 years beginning at age 22 years.  Lung cancer screening. / Every year if you are aged 73-80 years and have a 30-pack-year history of smoking and currently smoke or have quit within the past 15 years. Yearly screening is stopped once you have quit smoking for at least 15 years or develop a health problem that would prevent you from having lung cancer  treatment.  Clinical breast exam.** / Every year after age 4 years.  BRCA-related cancer risk assessment.** / For women who have family members with a BRCA-related cancer (breast, ovarian, tubal, or peritoneal cancers).  Mammogram.** / Every year beginning at age 40 years and continuing for as long as you are in good health. Consult with your health care provider.  Pap test.** / Every 3 years starting at age 9 years through age 34 or 91 years with 3 consecutive normal Pap tests. Testing can be stopped between 65 and 70 years with 3 consecutive normal Pap tests and no abnormal Pap or HPV tests in the past 10 years.  HPV screening.** / Every 3 years from ages 57 years through ages 64 or 45 years with a history of 3 consecutive normal Pap tests. Testing can be stopped between 65 and 70 years with 3 consecutive normal Pap tests and no abnormal Pap or HPV tests in the past 10 years.  Fecal occult blood test (FOBT) of stool. / Every year beginning at age 15 years and continuing until age 17 years. You may not need to do this test if you get a colonoscopy every 10 years.  Flexible sigmoidoscopy or colonoscopy.** / Every 5 years for a flexible sigmoidoscopy or every 10 years for a colonoscopy beginning at age 86 years and continuing until age 71 years.  Hepatitis C blood test.** / For all people born from [birth year] through 1963 and any individual with known risks for hepatitis C.  Osteoporosis screening.** / A one-time screening for women ages 83 years and over and women at risk for fractures or osteoporosis.  Skin self-exam. / Monthly.  Influenza vaccine. / Every year.  Tetanus, diphtheria, and acellular pertussis (Tdap/Td) vaccine.** / 1 dose of Td every 10 years.  Varicella vaccine.** / Consult your health care provider.  Zoster vaccine.** / 1 dose for adults aged 61 years or older.  Pneumococcal 13-valent conjugate (PCV13) vaccine.** / Consult your health care provider.  Pneumococcal  polysaccharide (PPSV23) vaccine.** / 1 dose for all adults aged 28 years and older.  Meningococcal vaccine.** / Consult your health care provider.  Hepatitis A vaccine.** / Consult your health care provider.  Hepatitis B vaccine.** / Consult your health care provider.  Haemophilus influenzae type b (Hib) vaccine.** / Consult your health care provider. ** Family history and personal history of risk and conditions may change your health care provider's recommendations. Document Released: 03/07/2001 Document Revised: 05/26/2013 Document Reviewed: 06/06/2010 [facility] Patient Information 2015 [place], [place]. This information is not intended to replace advice given to you by your health care provider. Make sure you discuss any questions you have with your health care provider.

## 2014-05-01 NOTE — Assessment & Plan Note (Signed)
  Ramipril

## 2014-05-01 NOTE — Assessment & Plan Note (Signed)
B12 labs 

## 2014-05-08 ENCOUNTER — Telehealth: Payer: Self-pay | Admitting: *Deleted

## 2014-05-08 ENCOUNTER — Encounter: Payer: Self-pay | Admitting: Internal Medicine

## 2014-05-08 NOTE — Telephone Encounter (Signed)
TC from patient this morning. She states she has had problems with seasonal allergies but now she feels as if she has a 'cold'. Family members had been visiting and one them had a cold. She states she has a very runny nose (clear secretions), sneezing and a 'lumpy' throat. Denies fever, cough, malaise.  Patient saw her PCP (Dr. Alain Marion) on last Friday 05/01/14. Instructed Ms. Garbers to call her PCP regarding this URI. Pt. Is not receiving active treatment from Dr. Benay Spice at this time and sees Dr. Benay Spice only once every 3-4 months.  Patient voiced understanding and will call her PCP.

## 2014-06-11 DIAGNOSIS — I739 Peripheral vascular disease, unspecified: Secondary | ICD-10-CM | POA: Diagnosis not present

## 2014-06-11 DIAGNOSIS — L603 Nail dystrophy: Secondary | ICD-10-CM | POA: Diagnosis not present

## 2014-06-25 ENCOUNTER — Ambulatory Visit (HOSPITAL_BASED_OUTPATIENT_CLINIC_OR_DEPARTMENT_OTHER): Payer: Medicare Other | Admitting: Oncology

## 2014-06-25 ENCOUNTER — Telehealth: Payer: Self-pay | Admitting: Oncology

## 2014-06-25 VITALS — BP 142/88 | HR 95 | Temp 97.6°F | Resp 18 | Ht 64.0 in | Wt 126.9 lb

## 2014-06-25 DIAGNOSIS — R0600 Dyspnea, unspecified: Secondary | ICD-10-CM

## 2014-06-25 DIAGNOSIS — C8599 Non-Hodgkin lymphoma, unspecified, extranodal and solid organ sites: Secondary | ICD-10-CM

## 2014-06-25 DIAGNOSIS — R260 Ataxic gait: Secondary | ICD-10-CM

## 2014-06-25 DIAGNOSIS — I429 Cardiomyopathy, unspecified: Secondary | ICD-10-CM

## 2014-06-25 DIAGNOSIS — R413 Other amnesia: Secondary | ICD-10-CM

## 2014-06-25 DIAGNOSIS — C8589 Other specified types of non-Hodgkin lymphoma, extranodal and solid organ sites: Secondary | ICD-10-CM

## 2014-06-25 DIAGNOSIS — I10 Essential (primary) hypertension: Secondary | ICD-10-CM

## 2014-06-25 DIAGNOSIS — N189 Chronic kidney disease, unspecified: Secondary | ICD-10-CM | POA: Diagnosis not present

## 2014-06-25 NOTE — Progress Notes (Signed)
  [place] OFFICE PROGRESS NOTE   Diagnosis: CNS lymphoma  INTERVAL HISTORY:   She returns as scheduled. She fell at home a few weeks ago on a step ladder and lacerated the right lower leg. This has healed. She reports favoring this leg while bowling last week and fell. She developed an ecchymosis at the right upper arm and hit her head. Ms. Bowne continues to live alone. She and her son have checked into retirement community's.  Objective:  Vital signs in last 24 hours:  Blood pressure 142/88, pulse 95, temperature 97.6 F (36.4 C), temperature source Oral, resp. rate 18, height 5\' 4"  (1.626 m), weight 126 lb 14.4 oz (57.561 kg), SpO2 97 %.    Resp: Lungs clear bilaterally Cardio: Regular rate and rhythm GI: No hepatomegaly, nontender Vascular: No leg edema Neuro: Alert, not oriented to year, diagnosis, or place. Good strength in both arms and legs. She ambulates with an unsteady gait.  Skin: Open superficial laceration at the right upper arm, ecchymosis at the right mid arm, healed laceration at the right pretibial region   Portacath/PICC-without erythema  Lab Results:  Lab Results  Component Value Date   WBC 6.3 05/01/2014   HGB 13.1 05/01/2014   HCT 38.2 05/01/2014   MCV 99.1 05/01/2014   PLT 205.0 05/01/2014   NEUTROABS 4.5 05/01/2014    Medications: I have reviewed the patient's current medications.  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  1. 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  2. Initiation of weekly rituximab therapy 08/31/2010, status post week number 4 on 09/23/2010. 3. Restaging MRI of the brain 10/07/2010 without evidence of disease progression. 4. Initiation of every 2 month "maintenance" rituximab on 11/16/2010. Last treated on 09/15/2011 5. Restaging MRI  of the brain 02/06/2011 unchanged aside from a slight increase in altered T2 signal in the subcortical region at the anterior left temporal lobe. 6. Restaging MRI of the brain 07/07/2011-minimal change in appearance of T2 hyperintensity involving the anterior left temporal lobe 7. Restaging MRI of the brain 01/15/2012-no significant change to suggest progression of tumor given limits of the unenhanced study 8. MRI of brain 03/03/2013 with stable postoperative changes. 9. MRI brain 11/19/2013 with unchanged postoperative appearance of the left temporal lobe, no recurrent mass  2. Gait ataxia and memory deficit, question related to the central nervous system lymphoma. The ataxia appears stable today.  3. Hypertension. 4. Nonischemic cardiomyopathy. 5. Status post aortic valve replacement. 6. Transient ischemic attack in 2010. 7. Chronic renal insufficiency 8. Chronic exertional dyspnea-likely related to the cardiomyopathy.  Disposition:  Ms. Whistler appears to be slowly declining with regard to her mental status. I have a low clinical suspicion for progression of the CNS lymphoma. He has chronic gait instability and memory loss. I recommended she moved into a retirement community. Her son is checking into this, but Ms. Weisgerber remains resistant. I recommended he remove the stepstool latter from the home.  She will return for an office visit in 4 months. We will see her sooner as needed.  Betsy Coder, MD  06/25/2014  10:16 AM

## 2014-06-25 NOTE — Telephone Encounter (Signed)
per pof to sch pt appt-per pt Son he asked Dr Benay Spice to come while he was in town 9/29-GBS okd-gave pt copy of sch

## 2014-08-27 DIAGNOSIS — L603 Nail dystrophy: Secondary | ICD-10-CM | POA: Diagnosis not present

## 2014-08-27 DIAGNOSIS — I739 Peripheral vascular disease, unspecified: Secondary | ICD-10-CM | POA: Diagnosis not present

## 2014-08-31 ENCOUNTER — Other Ambulatory Visit: Payer: Self-pay | Admitting: Internal Medicine

## 2014-09-18 ENCOUNTER — Telehealth: Payer: Self-pay | Admitting: *Deleted

## 2014-09-18 NOTE — Telephone Encounter (Signed)
Received voice message from son Legrand Como requesting referral for his mother to get caregiver in home.  Per Dr. Benay Spice; sent in-basket to SW to contact son.

## 2014-09-24 DIAGNOSIS — I7090 Unspecified atherosclerosis: Secondary | ICD-10-CM | POA: Diagnosis not present

## 2014-09-24 DIAGNOSIS — J338 Other polyp of sinus: Secondary | ICD-10-CM | POA: Diagnosis not present

## 2014-09-24 DIAGNOSIS — N390 Urinary tract infection, site not specified: Secondary | ICD-10-CM | POA: Diagnosis not present

## 2014-09-24 DIAGNOSIS — R918 Other nonspecific abnormal finding of lung field: Secondary | ICD-10-CM | POA: Diagnosis not present

## 2014-09-24 DIAGNOSIS — G9341 Metabolic encephalopathy: Secondary | ICD-10-CM | POA: Diagnosis not present

## 2014-09-24 DIAGNOSIS — I517 Cardiomegaly: Secondary | ICD-10-CM | POA: Diagnosis not present

## 2014-09-24 DIAGNOSIS — F0390 Unspecified dementia without behavioral disturbance: Secondary | ICD-10-CM | POA: Diagnosis not present

## 2014-09-24 DIAGNOSIS — E860 Dehydration: Secondary | ICD-10-CM | POA: Diagnosis not present

## 2014-09-24 DIAGNOSIS — J069 Acute upper respiratory infection, unspecified: Secondary | ICD-10-CM | POA: Diagnosis not present

## 2014-09-24 DIAGNOSIS — I712 Thoracic aortic aneurysm, without rupture: Secondary | ICD-10-CM | POA: Diagnosis not present

## 2014-09-24 DIAGNOSIS — M47814 Spondylosis without myelopathy or radiculopathy, thoracic region: Secondary | ICD-10-CM | POA: Diagnosis not present

## 2014-09-24 DIAGNOSIS — Z8673 Personal history of transient ischemic attack (TIA), and cerebral infarction without residual deficits: Secondary | ICD-10-CM | POA: Diagnosis not present

## 2014-09-25 DIAGNOSIS — M1990 Unspecified osteoarthritis, unspecified site: Secondary | ICD-10-CM | POA: Diagnosis present

## 2014-09-25 DIAGNOSIS — Z951 Presence of aortocoronary bypass graft: Secondary | ICD-10-CM | POA: Diagnosis not present

## 2014-09-25 DIAGNOSIS — I712 Thoracic aortic aneurysm, without rupture: Secondary | ICD-10-CM | POA: Diagnosis present

## 2014-09-25 DIAGNOSIS — E860 Dehydration: Secondary | ICD-10-CM | POA: Diagnosis present

## 2014-09-25 DIAGNOSIS — Z85841 Personal history of malignant neoplasm of brain: Secondary | ICD-10-CM | POA: Diagnosis not present

## 2014-09-25 DIAGNOSIS — N289 Disorder of kidney and ureter, unspecified: Secondary | ICD-10-CM | POA: Diagnosis present

## 2014-09-25 DIAGNOSIS — Z23 Encounter for immunization: Secondary | ICD-10-CM | POA: Diagnosis not present

## 2014-09-25 DIAGNOSIS — G9341 Metabolic encephalopathy: Secondary | ICD-10-CM | POA: Diagnosis present

## 2014-09-25 DIAGNOSIS — Z91040 Latex allergy status: Secondary | ICD-10-CM | POA: Diagnosis not present

## 2014-09-25 DIAGNOSIS — I350 Nonrheumatic aortic (valve) stenosis: Secondary | ICD-10-CM | POA: Diagnosis not present

## 2014-09-25 DIAGNOSIS — J069 Acute upper respiratory infection, unspecified: Secondary | ICD-10-CM | POA: Diagnosis present

## 2014-09-25 DIAGNOSIS — I10 Essential (primary) hypertension: Secondary | ICD-10-CM | POA: Diagnosis present

## 2014-09-25 DIAGNOSIS — N390 Urinary tract infection, site not specified: Secondary | ICD-10-CM | POA: Diagnosis present

## 2014-09-25 DIAGNOSIS — Z952 Presence of prosthetic heart valve: Secondary | ICD-10-CM | POA: Diagnosis not present

## 2014-09-25 DIAGNOSIS — I2510 Atherosclerotic heart disease of native coronary artery without angina pectoris: Secondary | ICD-10-CM | POA: Diagnosis present

## 2014-09-25 DIAGNOSIS — B961 Klebsiella pneumoniae [K. pneumoniae] as the cause of diseases classified elsewhere: Secondary | ICD-10-CM | POA: Diagnosis present

## 2014-09-25 DIAGNOSIS — F0390 Unspecified dementia without behavioral disturbance: Secondary | ICD-10-CM | POA: Diagnosis present

## 2014-09-25 NOTE — Telephone Encounter (Signed)
VOICE MAIL AT 3:41PM/ RETURN CALL AT 4:50PM/ SPOKE TO PT.'S SON, MICHAEL. HE WAS ASKING ABOUT THE CAREGIVER REFERRAL FOR HIS MOTHER. INFORMED PT.'S SON THAT MOST INSURANCE COMPANIES WILL NOT PAY FOR A CAREGIVER IN THE HOME. DR. Gearldine Shown NURSE, TANYA WHITLOCK,RN HAS LEFT A MESSAGE WITH THE SOCIAL WORKERS AT THE [facility] TO CALL MICHAEL WITH POSSIBLE RESOURCES FOR HIS MOTHER. PT.'S SON VOICES UNDERSTANDING.

## 2014-09-29 ENCOUNTER — Telehealth: Payer: Self-pay | Admitting: *Deleted

## 2014-09-29 ENCOUNTER — Encounter: Payer: Self-pay | Admitting: *Deleted

## 2014-09-29 MED ORDER — FOLIC ACID 1 MG PO TABS: 1.0000 mg | ORAL_TABLET | Freq: Every day | ORAL | Status: DC

## 2014-09-29 NOTE — Progress Notes (Signed)
[place] Work  Clinical Social Work was referred by Therapist, sports for home care needs and assessment of psychosocial needs.  Patients son contacted RN requesting information/referral for "in home caregiver".  Clinical Social Worker contacted patients son to offer support and assess for needs.  CSW was unable to reach patients son and left voicemail offering support.  CSW encouraged patients son to call back to further discuss home care options.         Johnnye Lana, MSW, LCSW, OSW-C Clinical Social Worker [facility] [phone]

## 2014-09-29 NOTE — Telephone Encounter (Signed)
Left msg on triage Friday afternoon stating needing to get 90 day on mom folic acid sent to [place]. Sent rx electronically...Johny Chess

## 2014-10-01 ENCOUNTER — Encounter: Payer: Self-pay | Admitting: *Deleted

## 2014-10-01 NOTE — Progress Notes (Signed)
[place] Work  Clinical Social Work followed up with son re. Home care needs. Son reports his mother has "caregiver benefits" through [facility] and provided CSW with contact info for RN Case manager who informed him of these benefits. CSW contacted to [facility] for clarification and left vm for Roselee Culver [phone]. [employer] provides services for skilled [facility] and son maybe confusing the two services. CSW to follow and awaits return call.  Clinical Social Work interventions: Resource assistance  Loren Racer, [place] Worker [place]  [place] Phone: [phone] Fax: [phone]

## 2014-10-02 ENCOUNTER — Telehealth: Payer: Self-pay | Admitting: Cardiology

## 2014-10-02 ENCOUNTER — Encounter: Payer: Self-pay | Admitting: *Deleted

## 2014-10-02 DIAGNOSIS — R4182 Altered mental status, unspecified: Secondary | ICD-10-CM | POA: Diagnosis not present

## 2014-10-02 MED ORDER — RAMIPRIL 2.5 MG PO CAPS: 2.5000 mg | ORAL_CAPSULE | Freq: Every day | ORAL | Status: DC

## 2014-10-02 NOTE — Progress Notes (Signed)
[place] Work  Clinical Social Work was contacted by son, Ronalee Belts and explained the difference between CNA services and [place]. CSW and son also discussed local resources and provided phone numbers to further pursue CNA services. Son appreciated information and assistance.    Clinical Social Work interventions: Resource education  Loren Racer, [place] Worker [place]  [place] Phone: [phone] Fax: [phone]

## 2014-10-02 NOTE — Telephone Encounter (Signed)
[place]   1. Which medications need to be refilled? Rampril 2.5 mg  2. Which pharmacy is medication to be sent to? [facility]  3. Do they need a 30 day or 90 day supply? 90 day supply  4. Would they like a call back once the medication has been sent to the pharmacy? Yes

## 2014-10-02 NOTE — Telephone Encounter (Signed)
Refill submitted to patient's preferred pharmacy. Informed patient. Pt voiced understanding, no other stated concerns at this time.  

## 2014-10-22 ENCOUNTER — Telehealth: Payer: Self-pay | Admitting: Oncology

## 2014-10-22 ENCOUNTER — Ambulatory Visit (HOSPITAL_BASED_OUTPATIENT_CLINIC_OR_DEPARTMENT_OTHER): Payer: Medicare Other | Admitting: Oncology

## 2014-10-22 VITALS — BP 133/72 | HR 98 | Temp 98.0°F | Resp 17 | Ht 64.0 in | Wt 129.7 lb

## 2014-10-22 DIAGNOSIS — C8599 Non-Hodgkin lymphoma, unspecified, extranodal and solid organ sites: Secondary | ICD-10-CM

## 2014-10-22 DIAGNOSIS — C8580 Other specified types of non-Hodgkin lymphoma, unspecified site: Secondary | ICD-10-CM

## 2014-10-22 NOTE — Progress Notes (Signed)
  [place] OFFICE PROGRESS NOTE   Diagnosis: CNS lymphoma  INTERVAL HISTORY:   [name] returns with her son for a scheduled visit. She stayed with her daughter in [place] for a month. While in [place] she was admitted with a urinary tract infection and altered mental status. A CT of the brain on 09/24/2014 revealed asymmetric low-density within the left temporal lobe insistent with encephalomalacia. Moderate to marked periventricular white matter low-density consistent with age-related small vessel disease.  Her son reports [name] has developed progressive memory loss. No recent fall.  Objective:  Vital signs in last 24 hours:  Blood pressure 133/72, pulse 98, temperature 98 F (36.7 C), temperature source Oral, resp. rate 17, height 5\' 4"  (1.626 m), weight 129 lb 11.2 oz (58.832 kg), SpO2 98 %.    HEENT: Neck without mass Lymphatics: No cervical, supraclavicular, or axillar nodes Resp: Lungs clear bilaterally Cardio: Regular rate and rhythm GI: No hepatosplenomegaly Vascular: No leg edema Neuro: Alert, oriented to year. Not oriented to diagnosis or day of the week  She ambulates with an unsteady gait. The motor strength appears intact in the upper and lower extremities    Medications: I have reviewed the patient's current medications.  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  1. 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  2. Initiation of weekly rituximab therapy 08/31/2010, status post week number 4 on 09/23/2010. 3. Restaging MRI of the brain 10/07/2010 without evidence of disease progression. 4. Initiation of every 2 month "maintenance" rituximab on 11/16/2010. Last treated on 09/15/2011 5. Restaging MRI of the brain 02/06/2011 unchanged aside from a slight increase in  altered T2 signal in the subcortical region at the anterior left temporal lobe. 6. Restaging MRI of the brain 07/07/2011-minimal change in appearance of T2 hyperintensity involving the anterior left temporal lobe 7. Restaging MRI of the brain 01/15/2012-no significant change to suggest progression of tumor given limits of the unenhanced study 8. MRI of brain 03/03/2013 with stable postoperative changes. 9. MRI brain 11/19/2013 with unchanged postoperative appearance of the left temporal lobe, no recurrent mass  2. Gait ataxia and memory deficit, question related to the central nervous system lymphoma. The ataxia appears worse today  3. Hypertension. 4. Nonischemic cardiomyopathy. 5. Status post aortic valve replacement. 6. Transient ischemic attack in 2010. 7. Chronic renal insufficiency 8. Chronic exertional dyspnea-likely related to the cardiomyopathy.   Disposition:  [name] has progressive memory loss and worsened ataxia. There is no clinical evidence for progression of the CNS lymphoma and a brain CT in [place] showed no acute changes. I suspect the memory loss is related to brain surgery and age-related dementia. I discussed the situation with her son. I recommend she move in with a family member, moved to an assisted living facility, or arrange for in home care.  She is considering moving to [place] to live with her daughter.  She will return for an office visit here in 4 months.  Betsy Coder, MD  10/22/2014  12:40 PM

## 2014-10-22 NOTE — Telephone Encounter (Signed)
Gave and printed appt sched and avs for pt Feb 2017 per pt request

## 2014-10-23 ENCOUNTER — Other Ambulatory Visit (INDEPENDENT_AMBULATORY_CARE_PROVIDER_SITE_OTHER): Payer: Medicare Other

## 2014-10-23 ENCOUNTER — Encounter: Payer: Self-pay | Admitting: Internal Medicine

## 2014-10-23 ENCOUNTER — Ambulatory Visit (INDEPENDENT_AMBULATORY_CARE_PROVIDER_SITE_OTHER): Payer: Medicare Other | Admitting: Internal Medicine

## 2014-10-23 VITALS — BP 130/80 | HR 86 | Temp 97.5°F | Wt 130.0 lb

## 2014-10-23 DIAGNOSIS — D518 Other vitamin B12 deficiency anemias: Secondary | ICD-10-CM

## 2014-10-23 DIAGNOSIS — F0390 Unspecified dementia without behavioral disturbance: Secondary | ICD-10-CM

## 2014-10-23 DIAGNOSIS — N10 Acute tubulo-interstitial nephritis: Secondary | ICD-10-CM

## 2014-10-23 DIAGNOSIS — I10 Essential (primary) hypertension: Secondary | ICD-10-CM | POA: Diagnosis not present

## 2014-10-23 DIAGNOSIS — R269 Unspecified abnormalities of gait and mobility: Secondary | ICD-10-CM

## 2014-10-23 DIAGNOSIS — N390 Urinary tract infection, site not specified: Secondary | ICD-10-CM | POA: Insufficient documentation

## 2014-10-23 LAB — URINALYSIS, ROUTINE W REFLEX MICROSCOPIC
Bilirubin Urine: NEGATIVE
HGB URINE DIPSTICK: NEGATIVE
Ketones, ur: NEGATIVE
Leukocytes, UA: NEGATIVE
NITRITE: NEGATIVE
PH: 5.5 (ref 5.0–8.0)
Specific Gravity, Urine: 1.02 (ref 1.000–1.030)
TOTAL PROTEIN, URINE-UPE24: 30 — AB
URINE GLUCOSE: NEGATIVE
UROBILINOGEN UA: 0.2 (ref 0.0–1.0)

## 2014-10-23 LAB — BASIC METABOLIC PANEL
BUN: 35 mg/dL — ABNORMAL HIGH (ref 6–23)
CO2: 28 meq/L (ref 19–32)
Calcium: 9.6 mg/dL (ref 8.4–10.5)
Chloride: 102 mEq/L (ref 96–112)
Creatinine, Ser: 1.13 mg/dL (ref 0.40–1.20)
GFR: 47.71 mL/min — ABNORMAL LOW (ref >60.00)
GLUCOSE: 94 mg/dL (ref 70–99)
POTASSIUM: 4.3 meq/L (ref 3.5–5.1)
SODIUM: 139 meq/L (ref 135–145)

## 2014-10-23 LAB — TSH: TSH: 3.44 u[IU]/mL (ref 0.35–4.50)

## 2014-10-23 LAB — VITAMIN B12: Vitamin B-12: 970 pg/mL — ABNORMAL HIGH (ref 211–911)

## 2014-10-23 MED ORDER — DONEPEZIL HCL 10 MG PO TABS: 10.0000 mg | ORAL_TABLET | Freq: Every day | ORAL | Status: DC

## 2014-10-23 NOTE — Progress Notes (Signed)
Subjective:  Patient ID: [name], female    DOB: 07-26-1919  Age: 81 y.o. MRN: [id number]  CC: No chief complaint on file.   HPI Leily Capek Delsol presents for a Klebsiella UTI and encephalopathy 4 days hosp stay in [place] (09/24/14)  Outpatient Prescriptions Prior to Visit  Medication Sig Dispense Refill  . cholecalciferol (VITAMIN D) 1000 UNITS tablet Take 1 tablet (1,000 Units total) by mouth daily. 100 tablet 3  . Clobetasol Prop Emollient Base 0.05 % emollient cream Apply topically 2 (two) times daily.    . Cyanocobalamin (VITAMIN B-12) 1000 MCG SUBL Place 1 tablet (1,000 mcg total) under the tongue daily. 876 tablet 3  . folic acid (FOLVITE) 1 MG tablet Take 1 tablet (1 mg total) by mouth daily. 90 tablet 3  . furosemide (LASIX) 20 MG tablet Take 1 tablet (20 mg total) by mouth daily as needed. 30 tablet 6  . ipratropium (ATROVENT) 0.06 % nasal spray Place 2 sprays into the nose 3 (three) times daily. 15 mL 3  . loratadine (CLARITIN) 10 MG tablet Take 1 tablet (10 mg total) by mouth daily.    . ramipril (ALTACE) 2.5 MG capsule Take 1 capsule (2.5 mg total) by mouth daily. 90 capsule 2   No facility-administered medications prior to visit.    ROS Review of Systems  Constitutional: Positive for fatigue. Negative for chills, activity change, appetite change and unexpected weight change.  HENT: Negative for congestion, mouth sores and sinus pressure.   Eyes: Negative for visual disturbance.  Respiratory: Negative for cough and chest tightness.   Gastrointestinal: Negative for nausea and abdominal pain.  Genitourinary: Negative for frequency, difficulty urinating and vaginal pain.  Musculoskeletal: Negative for back pain and gait problem.  Skin: Negative for pallor and rash.  Neurological: Negative for dizziness, tremors, weakness, numbness and headaches.  Psychiatric/Behavioral: Positive for sleep disturbance and decreased concentration. Negative for behavioral  problems, confusion and agitation.    Objective:  BP 130/80 mmHg  Pulse 86  Temp(Src) 97.5 F (36.4 C) (Oral)  Wt 130 lb (58.968 kg)  SpO2 98%  BP Readings from Last 3 Encounters:  10/23/14 130/80  10/22/14 133/72  06/25/14 142/88    Wt Readings from Last 3 Encounters:  10/23/14 130 lb (58.968 kg)  10/22/14 129 lb 11.2 oz (58.832 kg)  06/25/14 126 lb 14.4 oz (57.561 kg)    Physical Exam  Constitutional: She appears well-developed. No distress.  HENT:  Head: Normocephalic.  Right Ear: External ear normal.  Left Ear: External ear normal.  Nose: Nose normal.  Mouth/Throat: Oropharynx is clear and moist.  Eyes: Conjunctivae are normal. Pupils are equal, round, and reactive to light. Right eye exhibits no discharge. Left eye exhibits no discharge.  Neck: Normal range of motion. Neck supple. No JVD present. No tracheal deviation present. No thyromegaly present.  Cardiovascular: Normal rate, regular rhythm and normal heart sounds.   Pulmonary/Chest: No stridor. No respiratory distress. She has no wheezes.  Abdominal: Soft. Bowel sounds are normal. She exhibits no distension and no mass. There is no tenderness. There is no rebound and no guarding.  Musculoskeletal: She exhibits no edema or tenderness.  Lymphadenopathy:    She has no cervical adenopathy.  Neurological: She displays normal reflexes. No cranial nerve deficit. She exhibits normal muscle tone. Coordination normal.  Skin: No rash noted. No erythema.  Psychiatric: She has a normal mood and affect. Her behavior is normal. Judgment and thought content normal.    Lab  Results  Component Value Date   WBC 6.3 05/01/2014   HGB 13.1 05/01/2014   HCT 38.2 05/01/2014   PLT 205.0 05/01/2014   GLUCOSE 94 10/23/2014   CHOL 153 09/22/2013   TRIG 138.0 09/22/2013   HDL 35.70* 09/22/2013   LDLDIRECT 130.1 04/01/2010   LDLCALC 90 09/22/2013   ALT 10 05/01/2014   AST 19 05/01/2014   NA 139 10/23/2014   K 4.3 10/23/2014    CL 102 10/23/2014   CREATININE 1.13 10/23/2014   BUN 35* 10/23/2014   CO2 28 10/23/2014   TSH 3.44 10/23/2014   INR 0.98 11/11/2008   HGBA1C 5.9 03/17/2014    Ct Angio Chest Pe W/cm &/or Wo Cm  03/19/2014   CLINICAL DATA:  Left-sided chest pain.  Elevated D-dimer level.  EXAM: CT ANGIOGRAPHY CHEST WITH CONTRAST  TECHNIQUE: Multidetector CT imaging of the chest was performed using the standard protocol during bolus administration of intravenous contrast. Multiplanar CT image reconstructions and MIPs were obtained to evaluate the vascular anatomy.  CONTRAST:  74mL OMNIPAQUE IOHEXOL 350 MG/ML SOLN  COMPARISON:  CT scan of June 24, 2010.  FINDINGS: No pneumothorax or pleural effusion is noted. No acute pulmonary disease is noted. There is no evidence of pulmonary embolus. Aneurysmal dilatation of ascending thoracic aorta is noted with maximum measured diameter 4.9 cm which is not significantly changed compared to prior exam. There is also noted aneurysmal dilatation of the transverse aortic arch with maximum measured diameter of 5.0 cm, which is significantly increased compared to prior exam. Also noted is dilatation of descending thoracic aorta with maximum measured diameter 3.5 cm which is increased compared to prior exam. No mediastinal mass or adenopathy is noted. Visualized portion of upper abdomen appears normal. Sternotomy wires are noted.  Review of the MIP images confirms the above findings.  IMPRESSION: No evidence of pulmonary embolus.  Aneurysmal dilatation of ascending thoracic aorta is noted with maximum measured diameter of 4.9 cm which is not significantly changed compared to prior exam. Aneurysmal dilatation of transverse aortic arch is also noted with maximum measured diameter of 5.0 cm, which is significantly increased compared to prior exam of 2012.   Electronically Signed   By: Marijo Conception, M.D.   On: 03/19/2014 09:01    Assessment & Plan:   Diagnoses and all orders for this  visit:  Acute pyelonephritis -     Basic metabolic panel; Future -     Vitamin B12; Future -     TSH; Future -     Urinalysis; Future -     CULTURE, URINE COMPREHENSIVE; Future  ANEMIA, B12 DEFICIENCY -     Basic metabolic panel; Future -     Vitamin B12; Future -     TSH; Future -     Urinalysis; Future -     CULTURE, URINE COMPREHENSIVE; Future  GAIT IMBALANCE -     Basic metabolic panel; Future -     Vitamin B12; Future -     TSH; Future -     Urinalysis; Future -     CULTURE, URINE COMPREHENSIVE; Future  Dementia, without behavioral disturbance -     Basic metabolic panel; Future -     Vitamin B12; Future -     TSH; Future -     Urinalysis; Future -     CULTURE, URINE COMPREHENSIVE; Future  Essential hypertension -     Basic metabolic panel; Future -     Vitamin B12; Future -  TSH; Future -     Urinalysis; Future -     CULTURE, URINE COMPREHENSIVE; Future  Other orders -     donepezil (ARICEPT) 10 MG tablet; Take 1 tablet (10 mg total) by mouth at bedtime.   I am having Ms. Bures start on donepezil. I am also having her maintain her loratadine, furosemide, Clobetasol Prop Emollient Base, Vitamin B-12, ipratropium, cholecalciferol, folic acid, and ramipril.  Meds ordered this encounter  Medications  . donepezil (ARICEPT) 10 MG tablet    Sig: Take 1 tablet (10 mg total) by mouth at bedtime.    Dispense:  90 tablet    Refill:  3     Follow-up: Return in about 3 months (around 01/22/2015) for a follow-up visit.  Walker Kehr, MD

## 2014-10-23 NOTE — Progress Notes (Signed)
Pre visit review using our clinic review tool, if applicable. No additional management support is needed unless otherwise documented below in the visit note. 

## 2014-10-23 NOTE — Assessment & Plan Note (Signed)
On B12 

## 2014-10-23 NOTE — Assessment & Plan Note (Signed)
No falls

## 2014-10-23 NOTE — Assessment & Plan Note (Signed)
Mild - worse after a UTI spell 9/16 Labs

## 2014-10-23 NOTE — Assessment & Plan Note (Signed)
Klebsiella UTI and encephalopathy 4 days hosp stay in [place] (09/24/14) [place] records reviewed

## 2014-10-25 LAB — CULTURE, URINE COMPREHENSIVE
COLONY COUNT: NO GROWTH
ORGANISM ID, BACTERIA: NO GROWTH

## 2014-11-26 DIAGNOSIS — I739 Peripheral vascular disease, unspecified: Secondary | ICD-10-CM | POA: Diagnosis not present

## 2014-11-26 DIAGNOSIS — L603 Nail dystrophy: Secondary | ICD-10-CM | POA: Diagnosis not present

## 2014-12-02 ENCOUNTER — Encounter: Payer: Self-pay | Admitting: Cardiology

## 2015-01-06 ENCOUNTER — Ambulatory Visit (INDEPENDENT_AMBULATORY_CARE_PROVIDER_SITE_OTHER): Payer: Medicare Other | Admitting: Internal Medicine

## 2015-01-06 ENCOUNTER — Telehealth: Payer: Self-pay | Admitting: Oncology

## 2015-01-06 ENCOUNTER — Other Ambulatory Visit (INDEPENDENT_AMBULATORY_CARE_PROVIDER_SITE_OTHER): Payer: Medicare Other

## 2015-01-06 ENCOUNTER — Telehealth: Payer: Self-pay | Admitting: Internal Medicine

## 2015-01-06 ENCOUNTER — Encounter: Payer: Self-pay | Admitting: Internal Medicine

## 2015-01-06 VITALS — BP 132/90 | HR 90 | Wt 124.0 lb

## 2015-01-06 DIAGNOSIS — F0390 Unspecified dementia without behavioral disturbance: Secondary | ICD-10-CM | POA: Diagnosis not present

## 2015-01-06 DIAGNOSIS — Y92099 Unspecified place in other non-institutional residence as the place of occurrence of the external cause: Secondary | ICD-10-CM

## 2015-01-06 DIAGNOSIS — Y92009 Unspecified place in unspecified non-institutional (private) residence as the place of occurrence of the external cause: Secondary | ICD-10-CM

## 2015-01-06 DIAGNOSIS — W19XXXA Unspecified fall, initial encounter: Secondary | ICD-10-CM | POA: Insufficient documentation

## 2015-01-06 DIAGNOSIS — R55 Syncope and collapse: Secondary | ICD-10-CM

## 2015-01-06 LAB — CBC WITH DIFFERENTIAL/PLATELET
BASOS ABS: 0 10*3/uL (ref 0.0–0.1)
Basophils Relative: 0.5 % (ref 0.0–3.0)
EOS ABS: 0.3 10*3/uL (ref 0.0–0.7)
Eosinophils Relative: 5.1 % — ABNORMAL HIGH (ref 0.0–5.0)
HEMATOCRIT: 39.7 % (ref 36.0–46.0)
Hemoglobin: 13.3 g/dL (ref 12.0–15.0)
LYMPHS ABS: 0.8 10*3/uL (ref 0.7–4.0)
LYMPHS PCT: 14 % (ref 12.0–46.0)
MCHC: 33.6 g/dL (ref 30.0–36.0)
MCV: 101.3 fl — ABNORMAL HIGH (ref 78.0–100.0)
Monocytes Absolute: 0.7 10*3/uL (ref 0.1–1.0)
Monocytes Relative: 12.7 % — ABNORMAL HIGH (ref 3.0–12.0)
NEUTROS ABS: 3.7 10*3/uL (ref 1.4–7.7)
NEUTROS PCT: 67.7 % (ref 43.0–77.0)
PLATELETS: 236 10*3/uL (ref 150.0–400.0)
RBC: 3.92 Mil/uL (ref 3.87–5.11)
RDW: 13.2 % (ref 11.5–15.5)
WBC: 5.5 10*3/uL (ref 4.0–10.5)

## 2015-01-06 LAB — GLUCOSE, POCT (MANUAL RESULT ENTRY): POC GLUCOSE: 132 mg/dL — AB (ref 70–99)

## 2015-01-06 LAB — TSH: TSH: 2.32 u[IU]/mL (ref 0.35–4.50)

## 2015-01-06 LAB — BASIC METABOLIC PANEL
BUN: 26 mg/dL — ABNORMAL HIGH (ref 6–23)
CHLORIDE: 100 meq/L (ref 96–112)
CO2: 28 mEq/L (ref 19–32)
Calcium: 9.4 mg/dL (ref 8.4–10.5)
Creatinine, Ser: 1.19 mg/dL (ref 0.40–1.20)
GFR: 44.93 mL/min — ABNORMAL LOW (ref >60.00)
Glucose, Bld: 100 mg/dL — ABNORMAL HIGH (ref 70–99)
POTASSIUM: 4.3 meq/L (ref 3.5–5.1)
Sodium: 137 mEq/L (ref 135–145)

## 2015-01-06 LAB — HEPATIC FUNCTION PANEL
ALT: 7 U/L (ref 0–35)
AST: 15 U/L (ref 0–37)
Albumin: 4.1 g/dL (ref 3.5–5.2)
Alkaline Phosphatase: 84 U/L (ref 39–117)
BILIRUBIN DIRECT: 0.2 mg/dL (ref 0.0–0.3)
BILIRUBIN TOTAL: 1.1 mg/dL (ref 0.2–1.2)
Total Protein: 6.8 g/dL (ref 6.0–8.3)

## 2015-01-06 LAB — CREATININE KINASE MB: CK-MB: 1.8 ng/mL (ref 0.3–4.0)

## 2015-01-06 NOTE — Assessment & Plan Note (Signed)
01/03/15 ?syncope

## 2015-01-06 NOTE — Progress Notes (Signed)
Pre visit review using our clinic review tool, if applicable. No additional management support is needed unless otherwise documented below in the visit note. 

## 2015-01-06 NOTE — Assessment & Plan Note (Addendum)
Worse lately. There is a question of the pt taking her meds more than once a day due to forgetfulness: discussed. Needs more supervision.  Labs. She is not driving

## 2015-01-06 NOTE — Telephone Encounter (Signed)
It is ok Thx 

## 2015-01-06 NOTE — Assessment & Plan Note (Addendum)
01/03/15 ?arrhythmia vs vaso-vagal syncope vs other EKG Labs [facility] No driving F/u w/Dr Percival Spanish [facility] May need to hold Aricept

## 2015-01-06 NOTE — Telephone Encounter (Signed)
Maudie Mercury, nurse aid, called stated Dr. Camila Li order the urine specimen from Mrs. Sowle and she can not get the pt to urinate, Maudie Mercury was wondering if it would be okey to put the urine in the refrigerator and drop it off tomorrow? (if they can get pt to give sample sometime tonight). Maudie Mercury is leaving for the day, please call Arbie Cookey (daughter) back if this is [employer] # [phone]

## 2015-01-06 NOTE — Progress Notes (Signed)
Subjective:  Patient ID: [name], female    DOB: 02-06-1919  Age: 81 y.o. MRN: [id number]  CC: No chief complaint on file.   HPI Laquanta Dimanche Wuellner presents for a fall in the kitchen on Sunday - "I went down - fell asleep" at the kitchen sink. "It was dark". C/o R hip pain. Pt may have hit her head. No HA. No n/v. Pt is here w/her assistant...  Outpatient Prescriptions Prior to Visit  Medication Sig Dispense Refill  . cholecalciferol (VITAMIN D) 1000 UNITS tablet Take 1 tablet (1,000 Units total) by mouth daily. 100 tablet 3  . Clobetasol Prop Emollient Base 0.05 % emollient cream Apply topically 2 (two) times daily.    . Cyanocobalamin (VITAMIN B-12) 1000 MCG SUBL Place 1 tablet (1,000 mcg total) under the tongue daily. 100 tablet 3  . donepezil (ARICEPT) 10 MG tablet Take 1 tablet (10 mg total) by mouth at bedtime. 90 tablet 3  . folic acid (FOLVITE) 1 MG tablet Take 1 tablet (1 mg total) by mouth daily. 90 tablet 3  . furosemide (LASIX) 20 MG tablet Take 1 tablet (20 mg total) by mouth daily as needed. 30 tablet 6  . ipratropium (ATROVENT) 0.06 % nasal spray Place 2 sprays into the nose 3 (three) times daily. 15 mL 3  . loratadine (CLARITIN) 10 MG tablet Take 1 tablet (10 mg total) by mouth daily.    . ramipril (ALTACE) 2.5 MG capsule Take 1 capsule (2.5 mg total) by mouth daily. 90 capsule 2   No facility-administered medications prior to visit.    ROS Review of Systems  Constitutional: Positive for fatigue. Negative for chills, activity change, appetite change and unexpected weight change.  HENT: Negative for congestion, mouth sores and sinus pressure.   Eyes: Negative for visual disturbance.  Respiratory: Negative for cough and chest tightness.   Gastrointestinal: Negative for nausea and abdominal pain.  Genitourinary: Negative for frequency, difficulty urinating and vaginal pain.  Musculoskeletal: Negative for back pain and gait problem.  Skin: Negative for pallor  and rash.  Neurological: Positive for light-headedness. Negative for dizziness, tremors, weakness, numbness and headaches.  Psychiatric/Behavioral: Positive for confusion and decreased concentration. Negative for sleep disturbance and dysphoric mood.    Objective:  BP 132/90 mmHg  Pulse 90  Wt 124 lb (56.246 kg)  SpO2 93%  BP Readings from Last 3 Encounters:  01/06/15 132/90  10/23/14 130/80  10/22/14 133/72    Wt Readings from Last 3 Encounters:  01/06/15 124 lb (56.246 kg)  10/23/14 130 lb (58.968 kg)  10/22/14 129 lb 11.2 oz (58.832 kg)    Physical Exam  Constitutional: She appears well-developed. No distress.  HENT:  Head: Normocephalic.  Right Ear: External ear normal.  Left Ear: External ear normal.  Nose: Nose normal.  Mouth/Throat: Oropharynx is clear and moist.  Eyes: Conjunctivae are normal. Pupils are equal, round, and reactive to light. Right eye exhibits no discharge. Left eye exhibits no discharge.  Neck: Normal range of motion. Neck supple. No JVD present. No tracheal deviation present. No thyromegaly present.  Cardiovascular: Normal rate and normal heart sounds.  Exam reveals no gallop.   Pulmonary/Chest: No stridor. No respiratory distress. She has no wheezes.  Abdominal: Soft. Bowel sounds are normal. She exhibits no distension and no mass. There is no tenderness. There is no rebound and no guarding.  Musculoskeletal: She exhibits no edema or tenderness.  Lymphadenopathy:    She has no cervical adenopathy.  Neurological:  She displays normal reflexes. No cranial nerve deficit. She exhibits normal muscle tone. Coordination abnormal.  Skin: No rash noted. No erythema.  Psychiatric: She has a normal mood and affect. Her behavior is normal. Thought content normal.  heart murmur Reg HR w/irreg beats  EKG - no new changes Lab Results  Component Value Date   WBC 6.3 05/01/2014   HGB 13.1 05/01/2014   HCT 38.2 05/01/2014   PLT 205.0 05/01/2014   GLUCOSE 94  10/23/2014   CHOL 153 09/22/2013   TRIG 138.0 09/22/2013   HDL 35.70* 09/22/2013   LDLDIRECT 130.1 04/01/2010   LDLCALC 90 09/22/2013   ALT 10 05/01/2014   AST 19 05/01/2014   NA 139 10/23/2014   K 4.3 10/23/2014   CL 102 10/23/2014   CREATININE 1.13 10/23/2014   BUN 35* 10/23/2014   CO2 28 10/23/2014   TSH 3.44 10/23/2014   INR 0.98 11/11/2008   HGBA1C 5.9 03/17/2014    Ct Angio Chest Pe W/cm &/or Wo Cm  03/19/2014  CLINICAL DATA:  Left-sided chest pain.  Elevated D-dimer level. EXAM: CT ANGIOGRAPHY CHEST WITH CONTRAST TECHNIQUE: Multidetector CT imaging of the chest was performed using the standard protocol during bolus administration of intravenous contrast. Multiplanar CT image reconstructions and MIPs were obtained to evaluate the vascular anatomy. CONTRAST:  23mL OMNIPAQUE IOHEXOL 350 MG/ML SOLN COMPARISON:  CT scan of June 24, 2010. FINDINGS: No pneumothorax or pleural effusion is noted. No acute pulmonary disease is noted. There is no evidence of pulmonary embolus. Aneurysmal dilatation of ascending thoracic aorta is noted with maximum measured diameter 4.9 cm which is not significantly changed compared to prior exam. There is also noted aneurysmal dilatation of the transverse aortic arch with maximum measured diameter of 5.0 cm, which is significantly increased compared to prior exam. Also noted is dilatation of descending thoracic aorta with maximum measured diameter 3.5 cm which is increased compared to prior exam. No mediastinal mass or adenopathy is noted. Visualized portion of upper abdomen appears normal. Sternotomy wires are noted. Review of the MIP images confirms the above findings. IMPRESSION: No evidence of pulmonary embolus. Aneurysmal dilatation of ascending thoracic aorta is noted with maximum measured diameter of 4.9 cm which is not significantly changed compared to prior exam. Aneurysmal dilatation of transverse aortic arch is also noted with maximum measured diameter of  5.0 cm, which is significantly increased compared to prior exam of 2012. Electronically Signed   By: Marijo Conception, M.D.   On: 03/19/2014 09:01    Assessment & Plan:   Diagnoses and all orders for this visit:  Syncope and collapse -     EKG 12-Lead -     POCT glucose (manual entry) -     Urinalysis; Future -     TSH; Future -     CBC with Differential/Platelet; Future -     Basic metabolic panel; Future -     Hepatic function panel; Future -     CKMB; Future -     Ambulatory referral to Cardiology  Fall at home, initial encounter -     Urinalysis; Future -     TSH; Future -     CBC with Differential/Platelet; Future -     Basic metabolic panel; Future -     Hepatic function panel; Future -     CKMB; Future -     Ambulatory referral to Cardiology  Dementia, without behavioral disturbance -     Urinalysis; Future -  TSH; Future -     CBC with Differential/Platelet; Future -     Basic metabolic panel; Future -     Hepatic function panel; Future -     CKMB; Future  I am having Ms. Fish maintain her loratadine, furosemide, Clobetasol Prop Emollient Base, Vitamin B-12, ipratropium, cholecalciferol, folic acid, ramipril, and donepezil.  No orders of the defined types were placed in this encounter.     Follow-up: Return in about 4 weeks (around 02/03/2015) for a follow-up visit.  Walker Kehr, MD

## 2015-01-06 NOTE — Telephone Encounter (Signed)
pt son Hoover Browns called to r/s apt appt-req 2/23-gave updated time & date

## 2015-01-07 LAB — URINALYSIS, ROUTINE W REFLEX MICROSCOPIC
BILIRUBIN URINE: NEGATIVE
HGB URINE DIPSTICK: NEGATIVE
Ketones, ur: NEGATIVE
NITRITE: POSITIVE — AB
Specific Gravity, Urine: 1.02 (ref 1.000–1.030)
Total Protein, Urine: NEGATIVE
Urine Glucose: NEGATIVE
Urobilinogen, UA: 0.2 (ref 0.0–1.0)
pH: 5.5 (ref 5.0–8.0)

## 2015-01-07 NOTE — Telephone Encounter (Signed)
Notified pt daughter (carol) with md response...[name]

## 2015-01-08 ENCOUNTER — Emergency Department (HOSPITAL_COMMUNITY)
Admission: EM | Admit: 2015-01-08 | Discharge: 2015-01-08 | Disposition: A | Discharge status: Home / Self Care | Payer: Medicare Other | Attending: Emergency Medicine | Admitting: Emergency Medicine

## 2015-01-08 ENCOUNTER — Emergency Department (INDEPENDENT_AMBULATORY_CARE_PROVIDER_SITE_OTHER)
Admission: EM | Admit: 2015-01-08 | Discharge: 2015-01-08 | Disposition: A | Discharge status: Home / Self Care | Payer: Medicare Other | Source: Home / Self Care

## 2015-01-08 ENCOUNTER — Encounter (HOSPITAL_COMMUNITY): Payer: Self-pay | Admitting: *Deleted

## 2015-01-08 ENCOUNTER — Encounter (HOSPITAL_COMMUNITY): Payer: Self-pay | Admitting: Emergency Medicine

## 2015-01-08 ENCOUNTER — Emergency Department (HOSPITAL_COMMUNITY): Payer: Medicare Other

## 2015-01-08 ENCOUNTER — Other Ambulatory Visit: Payer: Self-pay | Admitting: Internal Medicine

## 2015-01-08 DIAGNOSIS — S8012XA Contusion of left lower leg, initial encounter: Secondary | ICD-10-CM | POA: Diagnosis not present

## 2015-01-08 DIAGNOSIS — Y92009 Unspecified place in unspecified non-institutional (private) residence as the place of occurrence of the external cause: Secondary | ICD-10-CM | POA: Diagnosis not present

## 2015-01-08 DIAGNOSIS — Z8719 Personal history of other diseases of the digestive system: Secondary | ICD-10-CM | POA: Diagnosis not present

## 2015-01-08 DIAGNOSIS — Z8673 Personal history of transient ischemic attack (TIA), and cerebral infarction without residual deficits: Secondary | ICD-10-CM | POA: Diagnosis not present

## 2015-01-08 DIAGNOSIS — Z79899 Other long term (current) drug therapy: Secondary | ICD-10-CM | POA: Diagnosis not present

## 2015-01-08 DIAGNOSIS — S0083XA Contusion of other part of head, initial encounter: Secondary | ICD-10-CM

## 2015-01-08 DIAGNOSIS — Y998 Other external cause status: Secondary | ICD-10-CM | POA: Diagnosis not present

## 2015-01-08 DIAGNOSIS — W01198A Fall on same level from slipping, tripping and stumbling with subsequent striking against other object, initial encounter: Secondary | ICD-10-CM | POA: Diagnosis not present

## 2015-01-08 DIAGNOSIS — S0003XA Contusion of scalp, initial encounter: Secondary | ICD-10-CM | POA: Insufficient documentation

## 2015-01-08 DIAGNOSIS — Z87440 Personal history of urinary (tract) infections: Secondary | ICD-10-CM | POA: Diagnosis not present

## 2015-01-08 DIAGNOSIS — S0990XA Unspecified injury of head, initial encounter: Secondary | ICD-10-CM | POA: Diagnosis present

## 2015-01-08 DIAGNOSIS — Y9389 Activity, other specified: Secondary | ICD-10-CM | POA: Insufficient documentation

## 2015-01-08 DIAGNOSIS — W19XXXA Unspecified fall, initial encounter: Secondary | ICD-10-CM

## 2015-01-08 DIAGNOSIS — R296 Repeated falls: Secondary | ICD-10-CM | POA: Diagnosis not present

## 2015-01-08 DIAGNOSIS — I10 Essential (primary) hypertension: Secondary | ICD-10-CM | POA: Insufficient documentation

## 2015-01-08 DIAGNOSIS — E538 Deficiency of other specified B group vitamins: Secondary | ICD-10-CM | POA: Diagnosis not present

## 2015-01-08 DIAGNOSIS — S9032XA Contusion of left foot, initial encounter: Secondary | ICD-10-CM | POA: Diagnosis not present

## 2015-01-08 DIAGNOSIS — Z8579 Personal history of other malignant neoplasms of lymphoid, hematopoietic and related tissues: Secondary | ICD-10-CM | POA: Insufficient documentation

## 2015-01-08 DIAGNOSIS — Z85841 Personal history of malignant neoplasm of brain: Secondary | ICD-10-CM | POA: Diagnosis not present

## 2015-01-08 DIAGNOSIS — M7989 Other specified soft tissue disorders: Secondary | ICD-10-CM | POA: Diagnosis not present

## 2015-01-08 MED ORDER — CIPROFLOXACIN HCL 250 MG PO TABS: 250.0000 mg | ORAL_TABLET | Freq: Two times a day (BID) | ORAL | Status: DC

## 2015-01-08 NOTE — ED Provider Notes (Addendum)
CSN: [id number]     Arrival date & time 01/08/15  1454 History   None    Chief Complaint  Patient presents with  . Fall   (Consider location/radiation/quality/duration/timing/severity/associated sxs/prior Treatment) Patient is a 81 y.o. female presenting with fall. The history is provided by the patient and a relative.  Fall This is a recurrent problem. The current episode started 3 to 5 hours ago. Episode frequency: falling twice since weekend without injury, no loc. The problem has been gradually worsening. Pertinent negatives include no chest pain, no abdominal pain and no headaches. The symptoms are aggravated by walking.    Past Medical History  Diagnosis Date  . Nonischemic cardiomyopathy [facility]) 2007 / Feb 2009    Miminial non-obs CAD cath (2007); EF previously 25%;  55% by echo 2012;  Echo 4/14:  mild LVH; mod asymmetric hypertrophy of the septum, EF 55-60%, Gr 1 DD, AVR ok (mean gradient 9), MAC, mild LAE, PASP 31  . Aortic insufficiency October 2007    Severe with ascending thoracic aneurysm; s/p pericardial tissue AVR and Heamsheild graft in October 2007; c/b pericardial effusion requiring window;  echo 03/2010: inferobasal HK, severe LVH, EF 55-60%, dynamic obstruction during Valsalva in mid cavity, peak velocity 232 cm/sec, peak gradient 22 mmHg, AVR ok, mod dilated aorta, mild LAE  . Hyperlipidemia   . Hypertension   . GERD (gastroesophageal reflux disease)   . History of cholelithiasis     Gallbladder dysfunction  . History of non anemic vitamin B12 deficiency   . Allergic rhinitis   . TIA (transient ischemic attack) October 2010  . Brain cancer ([place])   . CNS lymphoma ([place])   . History of recent fall 09/2013+    > 2 falls year   Past Surgical History  Procedure Laterality Date  . Tonsillectomy    . Aortic valve replacement  2007  . Bilateral vein stripping    . Left temporal tumor resected      B cell lymphoma   Family History  Problem Relation Age of Onset  .  Stroke Father   . Arthritis Other   . Coronary artery disease Other     Female 1st degree relative <50  . Diabetes Other   . Cerebral aneurysm Son    Social History  Substance Use Topics  . Smoking status: Never Smoker   . Smokeless tobacco: Never Used  . Alcohol Use: No   OB History    No data available     Review of Systems  Cardiovascular: Positive for leg swelling. Negative for chest pain.  Gastrointestinal: Negative for abdominal pain.  Musculoskeletal: Negative for back pain.  Skin: Negative.   Neurological: Negative for dizziness, speech difficulty, weakness and headaches.  All other systems reviewed and are negative.   Allergies  Review of patient's allergies indicates no known allergies.  Home Medications   Prior to Admission medications   Medication Sig Start Date End Date Taking? Authorizing Provider  cholecalciferol (VITAMIN D) 1000 UNITS tablet Take 1 tablet (1,000 Units total) by mouth daily. 05/01/14 05/01/15  Aleksei Plotnikov V, MD  ciprofloxacin (CIPRO) 250 MG tablet Take 1 tablet (250 mg total) by mouth 2 (two) times daily. 01/08/15   Aleksei Plotnikov V, MD  Cyanocobalamin (VITAMIN B-12) 1000 MCG SUBL Place 1 tablet (1,000 mcg total) under the tongue daily. 09/19/13   Aleksei Plotnikov V, MD  folic acid (FOLVITE) 1 MG tablet Take 1 tablet (1 mg total) by mouth daily. 09/29/14   Aleksei  Plotnikov V, MD  furosemide (LASIX) 20 MG tablet Take 1 tablet (20 mg total) by mouth daily as needed. Patient taking differently: Take 20 mg by mouth daily as needed for fluid.  08/06/12   Minus Breeding, MD  ipratropium (ATROVENT) 0.06 % nasal spray Place 2 sprays into the nose 3 (three) times daily. Patient not taking: Reported on 01/08/2015 05/01/14   Aleksei Plotnikov V, MD  loratadine (CLARITIN) 10 MG tablet Take 1 tablet (10 mg total) by mouth daily. 05/02/12   Liliane Shi, PA-C  ramipril (ALTACE) 2.5 MG capsule Take 1 capsule (2.5 mg total) by mouth daily. 10/02/14   Minus Breeding, MD   Meds Ordered and Administered this Visit  Medications - No data to display  BP 132/97 mmHg  Pulse 81  Temp(Src) 97.7 F (36.5 C) (Oral)  Resp 16  SpO2 100% No data found.   Physical Exam  Constitutional: She is oriented to person, place, and time. She appears well-developed and well-nourished. No distress.  Musculoskeletal: She exhibits edema. She exhibits no tenderness.       Arms: Neurological: She is alert and oriented to person, place, and time.  Skin: Skin is warm and dry.  Nursing note and vitals reviewed.   ED Course  Procedures (including critical care time)  Labs Review Labs Reviewed - No data to display  Imaging Review Dg Ankle Complete Left  01/08/2015  CLINICAL DATA:  Recurrent falls with pain and swelling EXAM: LEFT ANKLE COMPLETE - 3+ VIEW COMPARISON:  None. FINDINGS: Considerable soft tissue swelling is noted laterally. No acute fracture or dislocation is noted. IMPRESSION: Soft tissue swelling without acute bony abnormality. Electronically Signed   By: Inez Catalina M.D.   On: 01/08/2015 18:10   Ct Head Wo Contrast  01/08/2015  CLINICAL DATA:  Fall in sunroom at home. Head injury, struck right frontal region on clock. EXAM: CT HEAD WITHOUT CONTRAST TECHNIQUE: Contiguous axial images were obtained from the base of the skull through the vertex without intravenous contrast. COMPARISON:  Brain MRI 11/19/2013, head CT 05/17/2012 FINDINGS: No intracranial hemorrhage, mass effect, or midline shift. The 8 mm colloid cyst is unchanged in size and appearance from previous. No hydrocephalus. The basilar cisterns are patent. Postoperative change in the left temporal lobe with encephalomalacia. No evidence of territorial infarct. Age related atrophy. Moderate chronic small vessel ischemic change. Prior lacunar infarct in the left cerebellum, unchanged. No intracranial fluid collection. Atherosclerosis of skullbase vasculature again seen. Small right frontal scalp  hematoma. Calvarium is intact. Post left craniotomy. Included paranasal sinuses and mastoid air cells are well aerated. IMPRESSION: 1. Right frontal scalp hematoma without acute intracranial abnormality or fracture. 2. No acute intracranial abnormality. Stable postsurgical change and chronic small vessel ischemia. Electronically Signed   By: Jeb Levering M.D.   On: 01/08/2015 18:08   Dg Foot Complete Left  01/08/2015  CLINICAL DATA:  Initial encounter for Recurrent falls with pain and swelling, bruising to lateral metatarsals superior to left foot. EXAM: LEFT FOOT - COMPLETE 3+ VIEW COMPARISON:  Ankle films, dictated separately. FINDINGS: Osteopenia. Dorsal soft tissue swelling about the forefoot on the lateral view. No acute fracture or dislocation. IMPRESSION: Soft tissue swelling, without acute osseous abnormality. Electronically Signed   By: Abigail Miyamoto M.D.   On: 01/08/2015 17:48     Visual Acuity Review  Right Eye Distance:   Left Eye Distance:   Bilateral Distance:    Right Eye Near:   Left Eye Near:  Bilateral Near:         MDM   1. Falling episodes    Sent for eval of falling. H/o brain tumor.    Billy Fischer, MD 01/08/15 1538  Billy Fischer, MD 01/08/15 2012

## 2015-01-08 NOTE — Discharge Instructions (Signed)

## 2015-01-08 NOTE — ED Provider Notes (Signed)
CSN: [id number]     Arrival date & time 01/08/15  1603 History   First MD Initiated Contact with Patient 01/08/15 1648     Chief Complaint  Patient presents with  . Fall      Patient is a 81 y.o. female presenting with fall. The history is provided by the patient.  Fall This is a new problem. Pertinent negatives include no abdominal pain.   patient had a fall 6 days ago. Reportedly hit her head and time. Has had some slight confusion since. She also was just diagnosed with urinary tract infection. Seen in urgent care and sent here. Also has some pain in her left lower leg. No fevers. His bruising her left foot and somewhat ankle. She is not on anticoagulation. Otherwise appears to be at her baseline. Has a history of a brain cancer. Patient sent in also because her ribs hurt, but when discussing the patient appears to be right hip area.  Past Medical History  Diagnosis Date  . Nonischemic cardiomyopathy [facility]) 2007 / Feb 2009    Miminial non-obs CAD cath (2007); EF previously 25%;  55% by echo 2012;  Echo 4/14:  mild LVH; mod asymmetric hypertrophy of the septum, EF 55-60%, Gr 1 DD, AVR ok (mean gradient 9), MAC, mild LAE, PASP 31  . Aortic insufficiency October 2007    Severe with ascending thoracic aneurysm; s/p pericardial tissue AVR and Heamsheild graft in October 2007; c/b pericardial effusion requiring window;  echo 03/2010: inferobasal HK, severe LVH, EF 55-60%, dynamic obstruction during Valsalva in mid cavity, peak velocity 232 cm/sec, peak gradient 22 mmHg, AVR ok, mod dilated aorta, mild LAE  . Hyperlipidemia   . Hypertension   . GERD (gastroesophageal reflux disease)   . History of cholelithiasis     Gallbladder dysfunction  . History of non anemic vitamin B12 deficiency   . Allergic rhinitis   . TIA (transient ischemic attack) October 2010  . Brain cancer ([place])   . CNS lymphoma ([place])   . History of recent fall 09/2013+    > 2 falls year   Past Surgical History  Procedure  Laterality Date  . Tonsillectomy    . Aortic valve replacement  2007  . Bilateral vein stripping    . Left temporal tumor resected      B cell lymphoma   Family History  Problem Relation Age of Onset  . Stroke Father   . Arthritis Other   . Coronary artery disease Other     Female 1st degree relative <50  . Diabetes Other   . Cerebral aneurysm Son    Social History  Substance Use Topics  . Smoking status: Never Smoker   . Smokeless tobacco: Never Used  . Alcohol Use: No   OB History    No data available     Review of Systems  Constitutional: Negative for appetite change.  Eyes: Negative for pain.  Gastrointestinal: Negative for abdominal pain.  Genitourinary: Negative for flank pain.  Musculoskeletal:       Left lower leg pain.  Hematological: Does not bruise/bleed easily.  Psychiatric/Behavioral: Positive for confusion.      Allergies  Review of patient's allergies indicates no known allergies.  Home Medications   Prior to Admission medications   Medication Sig Start Date End Date Taking? Authorizing Provider  cholecalciferol (VITAMIN D) 1000 UNITS tablet Take 1 tablet (1,000 Units total) by mouth daily. 05/01/14 05/01/15 Yes Aleksei Plotnikov V, MD  ciprofloxacin (CIPRO) 250 MG  tablet Take 1 tablet (250 mg total) by mouth 2 (two) times daily. 01/08/15  Yes Aleksei Plotnikov V, MD  Cyanocobalamin (VITAMIN B-12) 1000 MCG SUBL Place 1 tablet (1,000 mcg total) under the tongue daily. 09/19/13  Yes Aleksei Plotnikov V, MD  folic acid (FOLVITE) 1 MG tablet Take 1 tablet (1 mg total) by mouth daily. 09/29/14  Yes Aleksei Plotnikov V, MD  furosemide (LASIX) 20 MG tablet Take 1 tablet (20 mg total) by mouth daily as needed. Patient taking differently: Take 20 mg by mouth daily as needed for fluid.  08/06/12  Yes Minus Breeding, MD  loratadine (CLARITIN) 10 MG tablet Take 1 tablet (10 mg total) by mouth daily. 05/02/12  Yes Scott Joylene Draft, PA-C  ramipril (ALTACE) 2.5 MG capsule Take  1 capsule (2.5 mg total) by mouth daily. 10/02/14  Yes Minus Breeding, MD  ipratropium (ATROVENT) 0.06 % nasal spray Place 2 sprays into the nose 3 (three) times daily. Patient not taking: Reported on 01/08/2015 05/01/14   Aleksei Plotnikov V, MD   BP 163/117 mmHg  Pulse 72  Temp(Src) 97.9 F (36.6 C) (Oral)  Resp 18  Wt 124 lb (56.246 kg)  SpO2 99% Physical Exam  Constitutional: She appears well-developed.  HENT:  Mild hematoma right frontal scalp.  Eyes: EOM are normal.  Cardiovascular: Normal rate.   Pulmonary/Chest: Effort normal. She exhibits no tenderness.  Abdominal: Soft. There is no tenderness.  Musculoskeletal: She exhibits tenderness.  Some tenderness on left lower leg near ankle. Ecchymosis medially with minimal tenderness. Some ecchymosis over top of left foot.  Skin: Skin is warm.    ED Course  Procedures (including critical care time) Labs Review Labs Reviewed - No data to display  Imaging Review Dg Ankle Complete Left  01/08/2015  CLINICAL DATA:  Recurrent falls with pain and swelling EXAM: LEFT ANKLE COMPLETE - 3+ VIEW COMPARISON:  None. FINDINGS: Considerable soft tissue swelling is noted laterally. No acute fracture or dislocation is noted. IMPRESSION: Soft tissue swelling without acute bony abnormality. Electronically Signed   By: Inez Catalina M.D.   On: 01/08/2015 18:10   Ct Head Wo Contrast  01/08/2015  CLINICAL DATA:  Fall in sunroom at home. Head injury, struck right frontal region on clock. EXAM: CT HEAD WITHOUT CONTRAST TECHNIQUE: Contiguous axial images were obtained from the base of the skull through the vertex without intravenous contrast. COMPARISON:  Brain MRI 11/19/2013, head CT 05/17/2012 FINDINGS: No intracranial hemorrhage, mass effect, or midline shift. The 8 mm colloid cyst is unchanged in size and appearance from previous. No hydrocephalus. The basilar cisterns are patent. Postoperative change in the left temporal lobe with encephalomalacia. No  evidence of territorial infarct. Age related atrophy. Moderate chronic small vessel ischemic change. Prior lacunar infarct in the left cerebellum, unchanged. No intracranial fluid collection. Atherosclerosis of skullbase vasculature again seen. Small right frontal scalp hematoma. Calvarium is intact. Post left craniotomy. Included paranasal sinuses and mastoid air cells are well aerated. IMPRESSION: 1. Right frontal scalp hematoma without acute intracranial abnormality or fracture. 2. No acute intracranial abnormality. Stable postsurgical change and chronic small vessel ischemia. Electronically Signed   By: Jeb Levering M.D.   On: 01/08/2015 18:08   Dg Foot Complete Left  01/08/2015  CLINICAL DATA:  Initial encounter for Recurrent falls with pain and swelling, bruising to lateral metatarsals superior to left foot. EXAM: LEFT FOOT - COMPLETE 3+ VIEW COMPARISON:  Ankle films, dictated separately. FINDINGS: Osteopenia. Dorsal soft tissue swelling about the forefoot  on the lateral view. No acute fracture or dislocation. IMPRESSION: Soft tissue swelling, without acute osseous abnormality. Electronically Signed   By: Abigail Miyamoto M.D.   On: 01/08/2015 17:48   I have personally reviewed and evaluated these images and lab results as part of my medical decision-making.   EKG Interpretation None      MDM   Final diagnoses:  Fall at home, initial encounter  Traumatic hematoma of forehead, initial encounter  Contusion of lower extremity, left, initial encounter    Patient with fall. Negative imaging. Doubt severe intra-abdominal intrathoracic injury. Is currently being treated for UTI and just started antibiotics. Will not recheck. Will discharge home.    Davonna Belling, MD 01/08/15 [phone]

## 2015-01-08 NOTE — ED Notes (Addendum)
Sent from urgent care, has been falling more lately-- hit head today-- has small hematoma to right side of head. No loc. Fell on Sunday, has pain in right lower side/ribs

## 2015-01-08 NOTE — ED Notes (Signed)
The pt was taken to the xray dept  Before i saw her

## 2015-01-08 NOTE — ED Notes (Signed)
Multiple falls .  She lives alone. She has a dog.  She has pain in her loiwer rt ribs but the pain is only when she moves.Marland Kitchen

## 2015-01-08 NOTE — ED Notes (Addendum)
Pt  Reports  She  Has  Fallen  Twice  In  The  Last  sev  Weeks   -  She  Reports       She  Hit her  Head   Today  Around  29   Am     She  Ambulated  To room    She  Is  Sitting  Upright  On  Exam table   She  Knows   The  Month  One  Day  Off  On  The  Day   Of the  Week       Recognizes family        Lives   Alone         No  Vomiting     Has some   Edema of feet

## 2015-01-12 ENCOUNTER — Ambulatory Visit: Payer: Medicare Other | Admitting: Internal Medicine

## 2015-01-15 DIAGNOSIS — L218 Other seborrheic dermatitis: Secondary | ICD-10-CM | POA: Diagnosis not present

## 2015-01-15 DIAGNOSIS — Z85828 Personal history of other malignant neoplasm of skin: Secondary | ICD-10-CM | POA: Diagnosis not present

## 2015-01-15 DIAGNOSIS — L821 Other seborrheic keratosis: Secondary | ICD-10-CM | POA: Diagnosis not present

## 2015-02-18 DIAGNOSIS — I739 Peripheral vascular disease, unspecified: Secondary | ICD-10-CM | POA: Diagnosis not present

## 2015-02-18 DIAGNOSIS — L603 Nail dystrophy: Secondary | ICD-10-CM | POA: Diagnosis not present

## 2015-02-24 ENCOUNTER — Other Ambulatory Visit: Payer: Self-pay | Admitting: Internal Medicine

## 2015-02-24 ENCOUNTER — Other Ambulatory Visit (INDEPENDENT_AMBULATORY_CARE_PROVIDER_SITE_OTHER): Payer: Medicare Other

## 2015-02-24 ENCOUNTER — Encounter: Payer: Self-pay | Admitting: Internal Medicine

## 2015-02-24 ENCOUNTER — Telehealth: Payer: Self-pay | Admitting: Internal Medicine

## 2015-02-24 DIAGNOSIS — R300 Dysuria: Secondary | ICD-10-CM

## 2015-02-24 LAB — URINALYSIS, ROUTINE W REFLEX MICROSCOPIC
BILIRUBIN URINE: NEGATIVE
HGB URINE DIPSTICK: NEGATIVE
KETONES UR: NEGATIVE
NITRITE: POSITIVE — AB
Specific Gravity, Urine: 1.02 (ref 1.000–1.030)
TOTAL PROTEIN, URINE-UPE24: 30 — AB
URINE GLUCOSE: NEGATIVE
UROBILINOGEN UA: 0.2 (ref 0.0–1.0)
pH: 5.5 (ref 5.0–8.0)

## 2015-02-24 MED ORDER — CIPROFLOXACIN HCL 250 MG PO TABS
250.0000 mg | ORAL_TABLET | Freq: Two times a day (BID) | ORAL | Status: DC
Start: 2015-02-24 — End: 2015-03-02

## 2015-02-24 NOTE — Telephone Encounter (Signed)
UA order placed. Rachel Bo, front desk scheduler is informing pt's caregiver.

## 2015-02-24 NOTE — Telephone Encounter (Signed)
Daughter believes patient might have a UTI.  Was wanting caregiver to bring in today but the only opening we have is 4pm with Jenny Reichmann and that would be too late. Caregiver leaves at 2pm. Daughter would like to know if Dr. Camila Li could just order a urinalysis.

## 2015-02-24 NOTE — Telephone Encounter (Signed)
Daughter called and that would like the results today of ua and meds called in today.  Best person is [phone] Daughter   [facility]

## 2015-02-24 NOTE — Telephone Encounter (Signed)
emailed

## 2015-02-24 NOTE — Telephone Encounter (Signed)
Spoke to [employer]. She was calling to follow up. Spoke with stacy who placed the UA order. Patient is coming asap to get UA done. [name] advised of the urgency of the UTI's with this patient. She asks that we give her a call directly at the # listed below with the results so that she knows.

## 2015-02-25 ENCOUNTER — Encounter: Payer: Self-pay | Admitting: Internal Medicine

## 2015-02-25 ENCOUNTER — Emergency Department (HOSPITAL_COMMUNITY): Payer: Medicare Other

## 2015-02-25 ENCOUNTER — Ambulatory Visit: Payer: Medicare Other | Admitting: Oncology

## 2015-02-25 ENCOUNTER — Encounter (HOSPITAL_COMMUNITY): Payer: Self-pay | Admitting: Emergency Medicine

## 2015-02-25 ENCOUNTER — Inpatient Hospital Stay (HOSPITAL_COMMUNITY)
Admission: EM | Admit: 2015-02-25 | Discharge: 2015-03-02 | DRG: 065 | Disposition: A | Discharge status: Skilled Nursing Facility | Payer: Medicare Other | Attending: Family Medicine | Admitting: Family Medicine

## 2015-02-25 ENCOUNTER — Ambulatory Visit (INDEPENDENT_AMBULATORY_CARE_PROVIDER_SITE_OTHER): Payer: Medicare Other | Admitting: Internal Medicine

## 2015-02-25 DIAGNOSIS — I110 Hypertensive heart disease with heart failure: Secondary | ICD-10-CM | POA: Diagnosis present

## 2015-02-25 DIAGNOSIS — E860 Dehydration: Secondary | ICD-10-CM | POA: Diagnosis present

## 2015-02-25 DIAGNOSIS — N179 Acute kidney failure, unspecified: Secondary | ICD-10-CM | POA: Diagnosis not present

## 2015-02-25 DIAGNOSIS — Z66 Do not resuscitate: Secondary | ICD-10-CM | POA: Diagnosis not present

## 2015-02-25 DIAGNOSIS — Z9181 History of falling: Secondary | ICD-10-CM

## 2015-02-25 DIAGNOSIS — Z8673 Personal history of transient ischemic attack (TIA), and cerebral infarction without residual deficits: Secondary | ICD-10-CM

## 2015-02-25 DIAGNOSIS — Z8249 Family history of ischemic heart disease and other diseases of the circulatory system: Secondary | ICD-10-CM

## 2015-02-25 DIAGNOSIS — D539 Nutritional anemia, unspecified: Secondary | ICD-10-CM | POA: Diagnosis present

## 2015-02-25 DIAGNOSIS — Z823 Family history of stroke: Secondary | ICD-10-CM

## 2015-02-25 DIAGNOSIS — I959 Hypotension, unspecified: Secondary | ICD-10-CM | POA: Diagnosis not present

## 2015-02-25 DIAGNOSIS — R55 Syncope and collapse: Secondary | ICD-10-CM

## 2015-02-25 DIAGNOSIS — I639 Cerebral infarction, unspecified: Secondary | ICD-10-CM

## 2015-02-25 DIAGNOSIS — C8589 Other specified types of non-Hodgkin lymphoma, extranodal and solid organ sites: Secondary | ICD-10-CM | POA: Diagnosis present

## 2015-02-25 DIAGNOSIS — I493 Ventricular premature depolarization: Secondary | ICD-10-CM | POA: Diagnosis present

## 2015-02-25 DIAGNOSIS — I10 Essential (primary) hypertension: Secondary | ICD-10-CM | POA: Diagnosis present

## 2015-02-25 DIAGNOSIS — C8339 Primary central nervous system lymphoma: Secondary | ICD-10-CM | POA: Diagnosis present

## 2015-02-25 DIAGNOSIS — I63521 Cerebral infarction due to unspecified occlusion or stenosis of right anterior cerebral artery: Principal | ICD-10-CM | POA: Diagnosis present

## 2015-02-25 DIAGNOSIS — K219 Gastro-esophageal reflux disease without esophagitis: Secondary | ICD-10-CM | POA: Diagnosis present

## 2015-02-25 DIAGNOSIS — R531 Weakness: Secondary | ICD-10-CM | POA: Diagnosis not present

## 2015-02-25 DIAGNOSIS — N390 Urinary tract infection, site not specified: Secondary | ICD-10-CM | POA: Diagnosis not present

## 2015-02-25 DIAGNOSIS — R42 Dizziness and giddiness: Secondary | ICD-10-CM

## 2015-02-25 DIAGNOSIS — Z952 Presence of prosthetic heart valve: Secondary | ICD-10-CM

## 2015-02-25 DIAGNOSIS — Z953 Presence of xenogenic heart valve: Secondary | ICD-10-CM

## 2015-02-25 DIAGNOSIS — I5032 Chronic diastolic (congestive) heart failure: Secondary | ICD-10-CM | POA: Diagnosis not present

## 2015-02-25 DIAGNOSIS — C8599 Non-Hodgkin lymphoma, unspecified, extranodal and solid organ sites: Secondary | ICD-10-CM | POA: Diagnosis present

## 2015-02-25 DIAGNOSIS — I428 Other cardiomyopathies: Secondary | ICD-10-CM | POA: Diagnosis present

## 2015-02-25 DIAGNOSIS — E785 Hyperlipidemia, unspecified: Secondary | ICD-10-CM | POA: Insufficient documentation

## 2015-02-25 DIAGNOSIS — R404 Transient alteration of awareness: Secondary | ICD-10-CM | POA: Diagnosis not present

## 2015-02-25 LAB — URINE MICROSCOPIC-ADD ON: RBC / HPF: NONE SEEN RBC/hpf (ref 0–5)

## 2015-02-25 LAB — URINALYSIS, ROUTINE W REFLEX MICROSCOPIC
GLUCOSE, UA: NEGATIVE mg/dL
HGB URINE DIPSTICK: NEGATIVE
Ketones, ur: NEGATIVE mg/dL
Nitrite: NEGATIVE
PH: 5 (ref 5.0–8.0)
Protein, ur: 30 mg/dL — AB
SPECIFIC GRAVITY, URINE: 1.021 (ref 1.005–1.030)

## 2015-02-25 LAB — DIFFERENTIAL
BASOS PCT: 0 %
Basophils Absolute: 0 10*3/uL (ref 0.0–0.1)
EOS ABS: 0.3 10*3/uL (ref 0.0–0.7)
EOS PCT: 4 %
Lymphocytes Relative: 8 %
Lymphs Abs: 0.6 10*3/uL — ABNORMAL LOW (ref 0.7–4.0)
MONO ABS: 1.1 10*3/uL — AB (ref 0.1–1.0)
Monocytes Relative: 15 %
Neutro Abs: 5.4 10*3/uL (ref 1.7–7.7)
Neutrophils Relative %: 73 %

## 2015-02-25 LAB — RAPID URINE DRUG SCREEN, HOSP PERFORMED
AMPHETAMINES: NOT DETECTED
BENZODIAZEPINES: NOT DETECTED
Barbiturates: NOT DETECTED
COCAINE: NOT DETECTED
Opiates: NOT DETECTED
Tetrahydrocannabinol: NOT DETECTED

## 2015-02-25 LAB — COMPREHENSIVE METABOLIC PANEL
ALT: 10 U/L — ABNORMAL LOW (ref 14–54)
ANION GAP: 11 (ref 5–15)
AST: 17 U/L (ref 15–41)
Albumin: 3.6 g/dL (ref 3.5–5.0)
Alkaline Phosphatase: 81 U/L (ref 38–126)
BUN: 30 mg/dL — ABNORMAL HIGH (ref 6–20)
CHLORIDE: 102 mmol/L (ref 101–111)
CO2: 24 mmol/L (ref 22–32)
CREATININE: 1.37 mg/dL — AB (ref 0.44–1.00)
Calcium: 9 mg/dL (ref 8.9–10.3)
GFR calc non Af Amer: 32 mL/min — ABNORMAL LOW (ref >60)
GFR, EST AFRICAN AMERICAN: 37 mL/min — AB (ref >60)
Glucose, Bld: 143 mg/dL — ABNORMAL HIGH (ref 65–99)
Potassium: 4.5 mmol/L (ref 3.5–5.1)
SODIUM: 137 mmol/L (ref 135–145)
Total Bilirubin: 0.6 mg/dL (ref 0.3–1.2)
Total Protein: 6.1 g/dL — ABNORMAL LOW (ref 6.5–8.1)

## 2015-02-25 LAB — CBC
HCT: 37.8 % (ref 36.0–46.0)
Hemoglobin: 12.8 g/dL (ref 12.0–15.0)
MCH: 34.9 pg — AB (ref 26.0–34.0)
MCHC: 33.9 g/dL (ref 30.0–36.0)
MCV: 103 fL — AB (ref 78.0–100.0)
PLATELETS: 189 10*3/uL (ref 150–400)
RBC: 3.67 MIL/uL — ABNORMAL LOW (ref 3.87–5.11)
RDW: 12.8 % (ref 11.5–15.5)
WBC: 7.4 10*3/uL (ref 4.0–10.5)

## 2015-02-25 LAB — PROTIME-INR
INR: 1.09 (ref 0.00–1.49)
PROTHROMBIN TIME: 14.3 s (ref 11.6–15.2)

## 2015-02-25 LAB — APTT: aPTT: 29 seconds (ref 24–37)

## 2015-02-25 LAB — I-STAT TROPONIN, ED: Troponin i, poc: 0.02 ng/mL (ref 0.00–0.08)

## 2015-02-25 LAB — ETHANOL

## 2015-02-25 MED ORDER — DEXTROSE 5 % IV SOLN
1.0000 g | Freq: Once | INTRAVENOUS | Status: AC
  Administered 2015-02-25: 1 g via INTRAVENOUS
  Filled 2015-02-25: qty 10

## 2015-02-25 NOTE — ED Notes (Signed)
Patient was eating dinner earlier this evening, when she became weak. Bystanders stated that she was very pale and diaphoretic also. Upon EMS arrival, patient was hypotensive, but normotensive enroute/arrival to the hospital. Patient stated that she has had several episodes like this lately. Patient seems to get weak upon trying to stand after prolonged sitting or lying down.

## 2015-02-25 NOTE — Patient Instructions (Signed)
Patients daughter made the appt. Patient UA yesterday was abnormal. Abx was rx'ed to pharmacy. Appt today was not needed. Cx results will be entered after verification of sample is still in the lab.

## 2015-02-25 NOTE — ED Provider Notes (Signed)
CSN: [id number]     Arrival date & time 02/25/15  2007 History   First MD Initiated Contact with Patient 02/25/15 2009     Chief Complaint  Patient presents with  . Weakness    Patient is a 82 y.o. female presenting with weakness. The history is provided by the patient.  Weakness This is a new problem. The current episode started more than 1 week ago. The problem occurs daily. The problem has been gradually worsening. Pertinent negatives include no chest pain, no headaches and no shortness of breath. The symptoms are aggravated by walking. The symptoms are relieved by rest.  pt reports "feeling weak" for several weeks She reports she has difficulty walking after standing up No syncope No falls No head injury No focal weakness No CP/SOB She does report cough  Nursing reports she was at dinner, reported she was weak, and was found to be hypotensive Pt is stable at this time   Past Medical History  Diagnosis Date  . Nonischemic cardiomyopathy [facility]) 2007 / Feb 2009    Miminial non-obs CAD cath (2007); EF previously 25%;  55% by echo 2012;  Echo 4/14:  mild LVH; mod asymmetric hypertrophy of the septum, EF 55-60%, Gr 1 DD, AVR ok (mean gradient 9), MAC, mild LAE, PASP 31  . Aortic insufficiency October 2007    Severe with ascending thoracic aneurysm; s/p pericardial tissue AVR and Heamsheild graft in October 2007; c/b pericardial effusion requiring window;  echo 03/2010: inferobasal HK, severe LVH, EF 55-60%, dynamic obstruction during Valsalva in mid cavity, peak velocity 232 cm/sec, peak gradient 22 mmHg, AVR ok, mod dilated aorta, mild LAE  . Hyperlipidemia   . Hypertension   . GERD (gastroesophageal reflux disease)   . History of cholelithiasis     Gallbladder dysfunction  . History of non anemic vitamin B12 deficiency   . Allergic rhinitis   . TIA (transient ischemic attack) October 2010  . Brain cancer ([place])   . CNS lymphoma ([place])   . History of recent fall 09/2013+    > 2 falls  year   Past Surgical History  Procedure Laterality Date  . Tonsillectomy    . Aortic valve replacement  2007  . Bilateral vein stripping    . Left temporal tumor resected      B cell lymphoma   Family History  Problem Relation Age of Onset  . Stroke Father   . Arthritis Other   . Coronary artery disease Other     Female 1st degree relative <50  . Diabetes Other   . Cerebral aneurysm Son    Social History  Substance Use Topics  . Smoking status: Never Smoker   . Smokeless tobacco: Never Used  . Alcohol Use: No   OB History    No data available     Review of Systems  Constitutional: Negative for fever.  Respiratory: Positive for cough. Negative for shortness of breath.   Cardiovascular: Negative for chest pain.  Neurological: Positive for weakness. Negative for syncope and headaches.  All other systems reviewed and are negative.     Allergies  Review of patient's allergies indicates no known allergies.  Home Medications   Prior to Admission medications   Medication Sig Start Date End Date Taking? Authorizing Provider  cholecalciferol (VITAMIN D) 1000 UNITS tablet Take 1 tablet (1,000 Units total) by mouth daily. 05/01/14 05/01/15  Aleksei Plotnikov V, MD  ciprofloxacin (CIPRO) 250 MG tablet Take 1 tablet (250 mg total) by  mouth 2 (two) times daily. 02/24/15   Aleksei Plotnikov V, MD  Cyanocobalamin (VITAMIN B-12) 1000 MCG SUBL Place 1 tablet (1,000 mcg total) under the tongue daily. 09/19/13   Aleksei Plotnikov V, MD  folic acid (FOLVITE) 1 MG tablet Take 1 tablet (1 mg total) by mouth daily. 09/29/14   Aleksei Plotnikov V, MD  furosemide (LASIX) 20 MG tablet Take 1 tablet (20 mg total) by mouth daily as needed. Patient taking differently: Take 20 mg by mouth daily as needed for fluid.  08/06/12   Minus Breeding, MD  ipratropium (ATROVENT) 0.06 % nasal spray Place 2 sprays into the nose 3 (three) times daily. Patient not taking: Reported on 01/08/2015 05/01/14   Aleksei Plotnikov  V, MD  loratadine (CLARITIN) 10 MG tablet Take 1 tablet (10 mg total) by mouth daily. 05/02/12   Liliane Shi, PA-C  ramipril (ALTACE) 2.5 MG capsule Take 1 capsule (2.5 mg total) by mouth daily. 10/02/14   Minus Breeding, MD   BP 168/98 mmHg  Pulse 80  Temp(Src) 98.3 F (36.8 C) (Oral)  Resp 13  SpO2 97% Physical Exam CONSTITUTIONAL: elderly HEAD: Normocephalic/atraumatic EYES: EOMI/PERRL, ?ptosis OD ENMT: Mucous membranes moist NECK: supple no meningeal signs SPINE/BACK:entire spine nontender CV: S1/S2 noted, no murmurs/rubs/gallops noted LUNGS: decreased BS noted in left base.  Crackles noted.  No distress noted ABDOMEN: soft, nontender, no rebound or guarding, bowel sounds noted throughout abdomen GU:no cva tenderness NEURO: Pt is awake/alert/appropriate, moves all extremitiesx4.  No facial droop.  No arm or leg drift EXTREMITIES: pulses normal/equal, full ROM SKIN: warm, color normal PSYCH: no abnormalities of mood noted, alert and oriented to situation  ED Course  Procedures   Labs Review Labs Reviewed  CBC - Abnormal; Notable for the following:    RBC 3.67 (*)    MCV 103.0 (*)    MCH 34.9 (*)    All other components within normal limits  DIFFERENTIAL - Abnormal; Notable for the following:    Lymphs Abs 0.6 (*)    Monocytes Absolute 1.1 (*)    All other components within normal limits  COMPREHENSIVE METABOLIC PANEL - Abnormal; Notable for the following:    Glucose, Bld 143 (*)    BUN 30 (*)    Creatinine, Ser 1.37 (*)    Total Protein 6.1 (*)    ALT 10 (*)    GFR calc non Af Amer 32 (*)    GFR calc Af Amer 37 (*)    All other components within normal limits  ETHANOL  PROTIME-INR  APTT  URINE RAPID DRUG SCREEN, HOSP PERFORMED  URINALYSIS, ROUTINE W REFLEX MICROSCOPIC (NOT AT [facility])  Randolm Idol, ED    Imaging Review Dg Chest 2 View  02/25/2015  CLINICAL DATA:  Eating dinner and became weak. EXAM: CHEST  2 VIEW COMPARISON:  03/19/2014 FINDINGS:  Previous median sternotomy and CABG procedure. Aortic atherosclerosis noted. Aneurysmal dilatation of the ascending thoracic aorta is again noted and appears similar in size to 2/20 2/16. No pleural effusion identified. No airspace consolidation or atelectasis. Degenerative disc disease is noted throughout the thoracic spine. IMPRESSION: No active cardiopulmonary disease. Similar appearance of ascending aortic aneurysm. Electronically Signed   By: Kerby Moors M.D.   On: 02/25/2015 21:32   Ct Head Wo Contrast  02/25/2015  CLINICAL DATA:  82 year old female with weakness. EXAM: CT HEAD WITHOUT CONTRAST TECHNIQUE: Contiguous axial images were obtained from the base of the skull through the vertex without intravenous contrast. COMPARISON:  Head CT 01/08/2015 FINDINGS: No intracranial hemorrhage, mass effect, or midline shift. Left temporal craniotomy with subjacent encephalomalacia, stable. Colloid cyst measures 7 mm, unchanged in size in appearance from prior allowing for differences in caliper placement. Atrophy and chronic small vessel ischemia unchanged. Prior lacunar infarct in the left cerebellum and probable left periventricular frontal lobe, stable. No hydrocephalus. The basilar cisterns are patent. No evidence of territorial infarct. No intracranial fluid collection. Calvarium is intact. Included paranasal sinuses and mastoid air cells are well aerated. IMPRESSION: 1.  No acute intracranial abnormality. 2. Stable postsurgical and chronic change. Electronically Signed   By: Jeb Levering M.D.   On: 02/25/2015 23:14   I have personally reviewed and evaluated these images and lab results as part of my medical decision-making.   EKG Interpretation   Date/Time:  Thursday February 25 2015 20:21:44 EST Ventricular Rate:  92 PR Interval:  155 QRS Duration: 113 QT Interval:  360 QTC Calculation: 445 R Axis:   -16 Text Interpretation:  Artifact irregular but suspect sinus with PAC Atrial  premature  complexes Borderline intraventricular conduction delay Abnormal  T, consider ischemia, lateral leads Confirmed by Christy Gentles  MD, Angelene Rome  [phone]) on 02/25/2015 8:25:59 PM     9:00 PM Pt here for generalized weakness.  She reports difficulty walking for weeks Given age/history, will obtain CT head She has abnormal lung sounds, will obtain CXR She recently was found to have UTI, she is already on cipro per PCP 10:13 PM 10:12 PM Friend that was at dinner with patient reports she felt weak and could not move and looked as though she might "pass out" No LOC reported Pt recently treated for UTI, currently on cipro  Spoke to son via phone Ronalee Belts [phone] - updated on plan   11:27 PM D/w dr Hal Hope Will admit for near syncope (h/o cardiomyopathy and aortic insufficiency) and also for UTI/dehydration  MDM   Final diagnoses:  None    Nursing notes including past medical history and social history reviewed and considered in documentation xrays/imaging reviewed by myself and considered during evaluation Previous records reviewed and considered Labs/vital reviewed myself and considered during evaluation     Ripley Fraise, MD 02/25/15 2327

## 2015-02-26 ENCOUNTER — Observation Stay (HOSPITAL_COMMUNITY): Payer: Medicare Other

## 2015-02-26 ENCOUNTER — Encounter (HOSPITAL_COMMUNITY): Payer: Self-pay | Admitting: Internal Medicine

## 2015-02-26 DIAGNOSIS — I10 Essential (primary) hypertension: Secondary | ICD-10-CM | POA: Diagnosis not present

## 2015-02-26 DIAGNOSIS — I110 Hypertensive heart disease with heart failure: Secondary | ICD-10-CM | POA: Diagnosis present

## 2015-02-26 DIAGNOSIS — Z8673 Personal history of transient ischemic attack (TIA), and cerebral infarction without residual deficits: Secondary | ICD-10-CM | POA: Diagnosis not present

## 2015-02-26 DIAGNOSIS — K219 Gastro-esophageal reflux disease without esophagitis: Secondary | ICD-10-CM | POA: Diagnosis present

## 2015-02-26 DIAGNOSIS — E785 Hyperlipidemia, unspecified: Secondary | ICD-10-CM | POA: Diagnosis present

## 2015-02-26 DIAGNOSIS — C8589 Other specified types of non-Hodgkin lymphoma, extranodal and solid organ sites: Secondary | ICD-10-CM | POA: Diagnosis present

## 2015-02-26 DIAGNOSIS — I639 Cerebral infarction, unspecified: Secondary | ICD-10-CM | POA: Diagnosis present

## 2015-02-26 DIAGNOSIS — E860 Dehydration: Secondary | ICD-10-CM | POA: Diagnosis present

## 2015-02-26 DIAGNOSIS — I959 Hypotension, unspecified: Secondary | ICD-10-CM | POA: Diagnosis present

## 2015-02-26 DIAGNOSIS — R531 Weakness: Secondary | ICD-10-CM | POA: Diagnosis not present

## 2015-02-26 DIAGNOSIS — Z66 Do not resuscitate: Secondary | ICD-10-CM | POA: Diagnosis not present

## 2015-02-26 DIAGNOSIS — Z5189 Encounter for other specified aftercare: Secondary | ICD-10-CM | POA: Diagnosis not present

## 2015-02-26 DIAGNOSIS — R278 Other lack of coordination: Secondary | ICD-10-CM | POA: Diagnosis not present

## 2015-02-26 DIAGNOSIS — D539 Nutritional anemia, unspecified: Secondary | ICD-10-CM | POA: Diagnosis present

## 2015-02-26 DIAGNOSIS — R55 Syncope and collapse: Secondary | ICD-10-CM

## 2015-02-26 DIAGNOSIS — M6281 Muscle weakness (generalized): Secondary | ICD-10-CM | POA: Diagnosis not present

## 2015-02-26 DIAGNOSIS — R279 Unspecified lack of coordination: Secondary | ICD-10-CM | POA: Diagnosis not present

## 2015-02-26 DIAGNOSIS — N179 Acute kidney failure, unspecified: Secondary | ICD-10-CM | POA: Diagnosis present

## 2015-02-26 DIAGNOSIS — I5032 Chronic diastolic (congestive) heart failure: Secondary | ICD-10-CM | POA: Diagnosis present

## 2015-02-26 DIAGNOSIS — Z9181 History of falling: Secondary | ICD-10-CM | POA: Diagnosis not present

## 2015-02-26 DIAGNOSIS — I63521 Cerebral infarction due to unspecified occlusion or stenosis of right anterior cerebral artery: Secondary | ICD-10-CM | POA: Diagnosis present

## 2015-02-26 DIAGNOSIS — Z823 Family history of stroke: Secondary | ICD-10-CM | POA: Diagnosis not present

## 2015-02-26 DIAGNOSIS — R262 Difficulty in walking, not elsewhere classified: Secondary | ICD-10-CM | POA: Diagnosis not present

## 2015-02-26 DIAGNOSIS — R489 Unspecified symbolic dysfunctions: Secondary | ICD-10-CM | POA: Diagnosis not present

## 2015-02-26 DIAGNOSIS — Z8249 Family history of ischemic heart disease and other diseases of the circulatory system: Secondary | ICD-10-CM | POA: Diagnosis not present

## 2015-02-26 DIAGNOSIS — Z953 Presence of xenogenic heart valve: Secondary | ICD-10-CM | POA: Diagnosis not present

## 2015-02-26 DIAGNOSIS — N390 Urinary tract infection, site not specified: Secondary | ICD-10-CM

## 2015-02-26 DIAGNOSIS — R2681 Unsteadiness on feet: Secondary | ICD-10-CM | POA: Diagnosis not present

## 2015-02-26 DIAGNOSIS — R1312 Dysphagia, oropharyngeal phase: Secondary | ICD-10-CM | POA: Diagnosis not present

## 2015-02-26 DIAGNOSIS — I428 Other cardiomyopathies: Secondary | ICD-10-CM | POA: Diagnosis present

## 2015-02-26 DIAGNOSIS — I493 Ventricular premature depolarization: Secondary | ICD-10-CM | POA: Diagnosis present

## 2015-02-26 LAB — COMPREHENSIVE METABOLIC PANEL
ALBUMIN: 3.2 g/dL — AB (ref 3.5–5.0)
ALK PHOS: 78 U/L (ref 38–126)
ALT: 11 U/L — AB (ref 14–54)
ANION GAP: 10 (ref 5–15)
AST: 16 U/L (ref 15–41)
BUN: 27 mg/dL — ABNORMAL HIGH (ref 6–20)
CALCIUM: 9 mg/dL (ref 8.9–10.3)
CHLORIDE: 102 mmol/L (ref 101–111)
CO2: 25 mmol/L (ref 22–32)
CREATININE: 1.12 mg/dL — AB (ref 0.44–1.00)
GFR calc Af Amer: 48 mL/min — ABNORMAL LOW (ref >60)
GFR calc non Af Amer: 41 mL/min — ABNORMAL LOW (ref >60)
GLUCOSE: 112 mg/dL — AB (ref 65–99)
Potassium: 4.2 mmol/L (ref 3.5–5.1)
SODIUM: 137 mmol/L (ref 135–145)
Total Bilirubin: 0.8 mg/dL (ref 0.3–1.2)
Total Protein: 5.8 g/dL — ABNORMAL LOW (ref 6.5–8.1)

## 2015-02-26 LAB — CBC WITH DIFFERENTIAL/PLATELET
BASOS PCT: 0 %
Basophils Absolute: 0 10*3/uL (ref 0.0–0.1)
Eosinophils Absolute: 0.4 10*3/uL (ref 0.0–0.7)
Eosinophils Relative: 8 %
HEMATOCRIT: 36 % (ref 36.0–46.0)
HEMOGLOBIN: 12.3 g/dL (ref 12.0–15.0)
LYMPHS ABS: 0.8 10*3/uL (ref 0.7–4.0)
Lymphocytes Relative: 15 %
MCH: 34.4 pg — AB (ref 26.0–34.0)
MCHC: 34.2 g/dL (ref 30.0–36.0)
MCV: 100.6 fL — ABNORMAL HIGH (ref 78.0–100.0)
MONO ABS: 0.9 10*3/uL (ref 0.1–1.0)
MONOS PCT: 18 %
NEUTROS ABS: 3 10*3/uL (ref 1.7–7.7)
Neutrophils Relative %: 59 %
Platelets: 177 10*3/uL (ref 150–400)
RBC: 3.58 MIL/uL — ABNORMAL LOW (ref 3.87–5.11)
RDW: 12.7 % (ref 11.5–15.5)
WBC: 5.1 10*3/uL (ref 4.0–10.5)

## 2015-02-26 LAB — GLUCOSE, CAPILLARY
GLUCOSE-CAPILLARY: 98 mg/dL (ref 65–99)
Glucose-Capillary: 103 mg/dL — ABNORMAL HIGH (ref 65–99)

## 2015-02-26 LAB — TROPONIN I: Troponin I: 0.03 ng/mL (ref <0.031)

## 2015-02-26 MED ORDER — ONDANSETRON HCL 4 MG/2ML IJ SOLN: 4.0000 mg | Freq: Four times a day (QID) | INTRAMUSCULAR | Status: DC | PRN

## 2015-02-26 MED ORDER — IPRATROPIUM BROMIDE 0.06 % NA SOLN
2.0000 | Freq: Three times a day (TID) | NASAL | Status: DC
  Administered 2015-02-26 – 2015-03-02 (×7): 2 via NASAL
  Filled 2015-02-26: qty 15

## 2015-02-26 MED ORDER — HYDRALAZINE HCL 20 MG/ML IJ SOLN
5.0000 mg | Freq: Four times a day (QID) | INTRAMUSCULAR | Status: DC | PRN
  Administered 2015-02-26 – 2015-02-28 (×2): 5 mg via INTRAVENOUS
  Filled 2015-02-26 (×2): qty 1

## 2015-02-26 MED ORDER — FOLIC ACID 1 MG PO TABS
1.0000 mg | ORAL_TABLET | Freq: Every day | ORAL | Status: DC
  Administered 2015-02-26 – 2015-03-01 (×4): 1 mg via ORAL
  Filled 2015-02-26 (×4): qty 1

## 2015-02-26 MED ORDER — ACETAMINOPHEN 325 MG PO TABS: 650.0000 mg | ORAL_TABLET | Freq: Four times a day (QID) | ORAL | Status: DC | PRN

## 2015-02-26 MED ORDER — LORATADINE 10 MG PO TABS
10.0000 mg | ORAL_TABLET | Freq: Every day | ORAL | Status: DC
  Administered 2015-02-26 – 2015-03-02 (×4): 10 mg via ORAL
  Filled 2015-02-26 (×5): qty 1

## 2015-02-26 MED ORDER — VITAMIN B-12 1000 MCG PO TABS
1000.0000 ug | ORAL_TABLET | Freq: Every day | ORAL | Status: DC
  Administered 2015-02-26 – 2015-03-02 (×5): 1000 ug via ORAL
  Filled 2015-02-26 (×5): qty 1

## 2015-02-26 MED ORDER — ONDANSETRON HCL 4 MG PO TABS: 4.0000 mg | ORAL_TABLET | Freq: Four times a day (QID) | ORAL | Status: DC | PRN

## 2015-02-26 MED ORDER — VITAMIN D 1000 UNITS PO TABS
1000.0000 [IU] | ORAL_TABLET | Freq: Every day | ORAL | Status: DC
  Administered 2015-02-26 – 2015-03-02 (×5): 1000 [IU] via ORAL
  Filled 2015-02-26 (×5): qty 1

## 2015-02-26 MED ORDER — DEXTROSE 5 % IV SOLN
1.0000 g | INTRAVENOUS | Status: DC
  Administered 2015-02-26 – 2015-02-28 (×3): 1 g via INTRAVENOUS
  Filled 2015-02-26 (×4): qty 10

## 2015-02-26 MED ORDER — ACETAMINOPHEN 650 MG RE SUPP: 650.0000 mg | Freq: Four times a day (QID) | RECTAL | Status: DC | PRN

## 2015-02-26 MED ORDER — RAMIPRIL 2.5 MG PO CAPS
2.5000 mg | ORAL_CAPSULE | Freq: Every day | ORAL | Status: DC
  Administered 2015-02-26: 2.5 mg via ORAL
  Filled 2015-02-26: qty 1

## 2015-02-26 MED ORDER — ENOXAPARIN SODIUM 30 MG/0.3ML ~~LOC~~ SOLN
30.0000 mg | SUBCUTANEOUS | Status: DC
  Administered 2015-02-26 – 2015-03-02 (×5): 30 mg via SUBCUTANEOUS
  Filled 2015-02-26 (×6): qty 0.3

## 2015-02-26 MED ORDER — SODIUM CHLORIDE 0.9 % IV SOLN
INTRAVENOUS | Status: DC
  Administered 2015-02-26: 06:00:00 via INTRAVENOUS

## 2015-02-26 NOTE — Consult Note (Signed)
Requesting Physician: Dr. Wendee Beavers    Chief Complaint:  CVA  HPI:                                                                                                                                         36 of history obtained from friend and H&P as patient cannot recall much of why she is here.   [name] is an 82 y.o. female  history of CNS lymphoma status post surgery in remission, bioprosthetic aortic valve replacement and ASD repair, previous history of nonischemic cardiomyopathy improved last EF measured in September 2016 was 65-70%, was brought to the ER after patient was found to be weak. Patient was having dinner at a restaurant when patient was found to be weak and diaphoretic. As per the ER physician and nursing notes patient was hypotensive on arrival. Improved with fluids. Per friend, she was out eating dinner with a man friend and became "pale and diaphoretic. EMS was called and she was brought to ED. She had generalized weakness and improved with fluids." MRI was obtained and does show a right ACA stroke. ECG shows PAC but no Afib. OF note, she was recently diagnosed with UTI and placed on Cipro. Had only taken 2 pills.   Date last known well: 2.2.2017 Time last known well: Time: 20:00 tPA Given: No: out of window    Past Medical History  Diagnosis Date  . Nonischemic cardiomyopathy [facility]) 2007 / Feb 2009    Miminial non-obs CAD cath (2007); EF previously 25%;  55% by echo 2012;  Echo 4/14:  mild LVH; mod asymmetric hypertrophy of the septum, EF 55-60%, Gr 1 DD, AVR ok (mean gradient 9), MAC, mild LAE, PASP 31  . Aortic insufficiency October 2007    Severe with ascending thoracic aneurysm; s/p pericardial tissue AVR and Heamsheild graft in October 2007; c/b pericardial effusion requiring window;  echo 03/2010: inferobasal HK, severe LVH, EF 55-60%, dynamic obstruction during Valsalva in mid cavity, peak velocity 232 cm/sec, peak gradient 22 mmHg, AVR ok, mod dilated aorta,  mild LAE  . Hyperlipidemia   . Hypertension   . GERD (gastroesophageal reflux disease)   . History of cholelithiasis     Gallbladder dysfunction  . History of non anemic vitamin B12 deficiency   . Allergic rhinitis   . TIA (transient ischemic attack) October 2010  . Brain cancer ([place])   . CNS lymphoma ([place])   . History of recent fall 09/2013+    > 2 falls year    Past Surgical History  Procedure Laterality Date  . Tonsillectomy    . Aortic valve replacement  2007  . Bilateral vein stripping    . Left temporal tumor resected      B cell lymphoma    Family History  Problem Relation Age of Onset  . Stroke Father   . Arthritis Other   .  Coronary artery disease Other     Female 1st degree relative <50  . Diabetes Other   . Cerebral aneurysm Son    Social History:  reports that she has never smoked. She has never used smokeless tobacco. She reports that she does not drink alcohol or use illicit drugs.  Allergies: No Known Allergies  Medications:                                                                                                                           Prior to Admission:  Prescriptions prior to admission  Medication Sig Dispense Refill Last Dose  . cholecalciferol (VITAMIN D) 1000 UNITS tablet Take 1 tablet (1,000 Units total) by mouth daily. 100 tablet 3 01/08/2015 at Unknown time  . ciprofloxacin (CIPRO) 250 MG tablet Take 1 tablet (250 mg total) by mouth 2 (two) times daily. 10 tablet 0   . Cyanocobalamin (VITAMIN B-12) 1000 MCG SUBL Place 1 tablet (1,000 mcg total) under the tongue daily. 100 tablet 3 01/08/2015 at Unknown time  . folic acid (FOLVITE) 1 MG tablet Take 1 tablet (1 mg total) by mouth daily. 90 tablet 3 01/08/2015 at Unknown time  . furosemide (LASIX) 20 MG tablet Take 1 tablet (20 mg total) by mouth daily as needed. (Patient taking differently: Take 20 mg by mouth daily as needed for fluid. ) 30 tablet 6 01/07/2015 at Unknown time  . ipratropium  (ATROVENT) 0.06 % nasal spray Place 2 sprays into the nose 3 (three) times daily. (Patient not taking: Reported on 01/08/2015) 15 mL 3 Not Taking at Unknown time  . loratadine (CLARITIN) 10 MG tablet Take 1 tablet (10 mg total) by mouth daily.   01/08/2015 at Unknown time  . ramipril (ALTACE) 2.5 MG capsule Take 1 capsule (2.5 mg total) by mouth daily. 90 capsule 2 01/08/2015 at Unknown time   Scheduled: . cefTRIAXone (ROCEPHIN)  IV  1 g Intravenous Q24H  . cholecalciferol  1,000 Units Oral Daily  . enoxaparin (LOVENOX) injection  30 mg Subcutaneous Q24H  . folic acid  1 mg Oral Daily  . ipratropium  2 spray Nasal TID  . loratadine  10 mg Oral Daily  . ramipril  2.5 mg Oral Daily  . vitamin B-12  1,000 mcg Oral Daily    ROS:  History obtained from friend  General ROS: negative for - chills, fatigue, fever, night sweats, weight gain or weight loss Psychological ROS: negative for - behavioral disorder, hallucinations, memory difficulties, mood swings or suicidal ideation Ophthalmic ROS: negative for - blurry vision, double vision, eye pain or loss of vision ENT ROS: negative for - epistaxis, nasal discharge, oral lesions, sore throat, tinnitus or vertigo Allergy and Immunology ROS: negative for - hives or itchy/watery eyes Hematological and Lymphatic ROS: negative for - bleeding problems, bruising or swollen lymph nodes Endocrine ROS: negative for - galactorrhea, hair pattern changes, polydipsia/polyuria or temperature intolerance Respiratory ROS: negative for - cough, hemoptysis, shortness of breath or wheezing Cardiovascular ROS: negative for - chest pain, dyspnea on exertion, edema or irregular heartbeat Gastrointestinal ROS: negative for - abdominal pain, diarrhea, hematemesis, nausea/vomiting or stool incontinence Genito-Urinary ROS: negative for -  dysuria, hematuria, incontinence or urinary frequency/urgency Musculoskeletal ROS: negative for - joint swelling or muscular weakness Neurological ROS: as noted in HPI Dermatological ROS: negative for rash and skin lesion changes  Neurologic Examination:                                                                                                      Blood pressure 133/88, pulse 79, temperature 97.7 F (36.5 C), temperature source Oral, resp. rate 18, height 5\' 4"  (1.626 m), weight 56.836 kg (125 lb 4.8 oz), SpO2 98 %.  HEENT-  Normocephalic, no lesions, without obvious abnormality.  Normal external eye and conjunctiva.  Normal TM's bilaterally.  Normal auditory canals and external ears. Normal external nose, mucus membranes and septum.  Normal pharynx. Cardiovascular- S1, S2 normal, pulses palpable throughout   Lungs- no tachypnea, retractions or cyanosis Abdomen- normal findings: bowel sounds normal Extremities- no edema Lymph-no adenopathy palpable Musculoskeletal-no joint tenderness, deformity or swelling Skin-warm and dry, no hyperpigmentation, vitiligo, or suspicious lesions  Neurological Examination Mental Status: Alert, oriented, thought content appropriate.  Speech fluent without evidence of aphasia.  Able to follow 3 step commands needing some help but able to follow simple commands without problems.  Cranial Nerves: II: Discs flat bilaterally; Visual fields grossly normal, pupils equal, round, reactive to light and accommodation III,IV, VI: ptosis not present, extra-ocular motions intact bilaterally V,VII: smile symmetric, facial light touch sensation normal bilaterally VIII: hearing normal bilaterally IX,X: uvula rises symmetrically XI: bilateral shoulder shrug XII: midline tongue extension Motor: Right : Upper extremity   5/5    Left:     Upper extremity   5/5  Lower extremity   5/5     Lower extremity   5/5 Tone and bulk:normal tone throughout; no atrophy  noted Sensory: Pinprick and light touch intact throughout, bilaterally Deep Tendon Reflexes: 1+ and symmetric throughout Plantars: Right: downgoing   Left: downgoing Cerebellar: normal finger-to-nose,  and normal heel-to-shin test Gait: not tested       Lab Results: Basic Metabolic Panel:  Recent Labs Lab 02/25/15 2103 02/26/15 0540  NA 137 137  K 4.5 4.2  CL 102 102  CO2 24 25  GLUCOSE 143* 112*  BUN 30*  27*  CREATININE 1.37* 1.12*  CALCIUM 9.0 9.0    Liver Function Tests:  Recent Labs Lab 02/25/15 2103 02/26/15 0540  AST 17 16  ALT 10* 11*  ALKPHOS 81 78  BILITOT 0.6 0.8  PROT 6.1* 5.8*  ALBUMIN 3.6 3.2*   No results for input(s): LIPASE, AMYLASE in the last 168 hours. No results for input(s): AMMONIA in the last 168 hours.  CBC:  Recent Labs Lab 02/25/15 2103 02/26/15 0540  WBC 7.4 5.1  NEUTROABS 5.4 3.0  HGB 12.8 12.3  HCT 37.8 36.0  MCV 103.0* 100.6*  PLT 189 177    Cardiac Enzymes:  Recent Labs Lab 02/26/15 0540  TROPONINI 0.03    Lipid Panel: No results for input(s): CHOL, TRIG, HDL, CHOLHDL, VLDL, LDLCALC in the last 168 hours.  CBG:  Recent Labs Lab 02/26/15 0852  GLUCAP 103*    Microbiology: Results for orders placed or performed in visit on 10/23/14  CULTURE, URINE COMPREHENSIVE     Status: None   Collection Time: 10/23/14 11:11 AM  Result Value Ref Range Status   Colony Count NO GROWTH  Final   Organism ID, Bacteria NO GROWTH  Final    Coagulation Studies:  Recent Labs  02/25/15 2103  LABPROT 14.3  INR 1.09    Imaging: Dg Chest 2 View  02/25/2015  CLINICAL DATA:  Eating dinner and became weak. EXAM: CHEST  2 VIEW COMPARISON:  03/19/2014 FINDINGS: Previous median sternotomy and CABG procedure. Aortic atherosclerosis noted. Aneurysmal dilatation of the ascending thoracic aorta is again noted and appears similar in size to 2/20 2/16. No pleural effusion identified. No airspace consolidation or atelectasis.  Degenerative disc disease is noted throughout the thoracic spine. IMPRESSION: No active cardiopulmonary disease. Similar appearance of ascending aortic aneurysm. Electronically Signed   By: Kerby Moors M.D.   On: 02/25/2015 21:32   Ct Head Wo Contrast  02/25/2015  CLINICAL DATA:  82 year old female with weakness. EXAM: CT HEAD WITHOUT CONTRAST TECHNIQUE: Contiguous axial images were obtained from the base of the skull through the vertex without intravenous contrast. COMPARISON:  Head CT 01/08/2015 FINDINGS: No intracranial hemorrhage, mass effect, or midline shift. Left temporal craniotomy with subjacent encephalomalacia, stable. Colloid cyst measures 7 mm, unchanged in size in appearance from prior allowing for differences in caliper placement. Atrophy and chronic small vessel ischemia unchanged. Prior lacunar infarct in the left cerebellum and probable left periventricular frontal lobe, stable. No hydrocephalus. The basilar cisterns are patent. No evidence of territorial infarct. No intracranial fluid collection. Calvarium is intact. Included paranasal sinuses and mastoid air cells are well aerated. IMPRESSION: 1.  No acute intracranial abnormality. 2. Stable postsurgical and chronic change. Electronically Signed   By: Jeb Levering M.D.   On: 02/25/2015 23:14   Mr Brain Wo Contrast  02/26/2015  CLINICAL DATA:  Increased weakness. Dizziness. 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 EXAM: MRI HEAD WITHOUT CONTRAST TECHNIQUE: Multiplanar, multiecho pulse sequences of the brain and surrounding structures were obtained without intravenous contrast. COMPARISON:  Head CT 02/25/2015 and MRI 11/11/2013 FINDINGS: There is a 1 cm focus of restricted diffusion in the right cingulate gyrus consistent with acute ACA territory infarct. A few adjacent punctate foci of restricted diffusion are also noted more posteriorly. A small curvilinear focus of FLAIR hyperintensity in the  interhemispheric fissure in this region may reflect slow flow or thrombosis of an ACA branch vessel responsible for this infarct. Multiple chronic microhemorrhages in both cerebellar hemispheres  as well as a few scattered cerebral hemispheric microhemorrhages are unchanged from the prior MRI. Chronic infarcts are again seen in the right thalamus and left cerebellum. There are also chronic infarcts in the right cerebellum and left frontal white matter which are new from the prior MRI. Patchy to confluent T2 hyperintensities in the subcortical and deep cerebral white matter bilaterally have slightly progressed from the prior MRI and are nonspecific but compatible with moderate chronic small vessel ischemic disease. Sequelae of left temporal craniotomy are again identified with unchanged underlying left temporal lobe encephalomalacia. No mass is identified on this unenhanced study. There is no midline shift or extra-axial fluid collection. Prior bilateral cataract extraction is noted. A right maxillary sinus mucous retention cyst is again seen. There is an unchanged, trace right mastoid effusion. Major intracranial vascular flow voids are preserved. IMPRESSION: 1. Small, acute right ACA territory infarct involving the cingulate gyrus. 2. Moderate chronic small vessel ischemic disease, progressed from 2015 with additional chronic infarcts as above. Electronically Signed   By: Logan Bores M.D.   On: 02/26/2015 08:59       Assessment and plan discussed with with attending physician and they are in agreement.    Etta Quill PA-C [facility] [phone]  02/26/2015, 12:07 PM   Assessment: 82 y.o. female with new infarct in the [place] distribution on the right. Possibilities include due to hypotension, embolus, or atherosclerotic disease. She has been admitted for workup.  Stroke Risk Factors - hyperlipidemia and hypertension  1. HgbA1c, fasting lipid panel 2. Frequent neuro checks 3.  Echocardiogram 4. Carotid dopplers 5. Prophylactic therapy-Antiplatelet med: Aspirin - dose 325mg  PO or 300mg  PR 6. Risk factor modification 7. Telemetry monitoring 8. PT consult, OT consult, Speech consult 9. please page stroke NP  Or  PA  Or MD  M-F from 8am -4 pm starting 02/26/15 as this patient will be followed by the stroke team at this point.   You can look them up on [email or web address]  Password [email or web address]  Roland Rack, MD [facility] [phone]  If 7pm- 7am, please page neurology on call as listed in [place].

## 2015-02-26 NOTE — Progress Notes (Deleted)
Patient refuses to get her bed alarm on. Explained the importance but continue to refused.

## 2015-02-26 NOTE — H&P (Signed)
[facility] History and Physical  [name] [id number] DOB: 10/17/1919 DOA: 02/25/2015  Referring physician: Dr. Christy Gentles. PCP: Walker Kehr, MD  Specialists: Dr. Donneta Romberg. Oncologist.  Chief Complaint: Weakness.  HPI: [name] is a 82 y.o. female history of CNS lymphoma status post surgery in remission, bioprosthetic aortic valve replacement and ASD repair, previous history of nonischemic cardiomyopathy improved last EF measured in September 2016 was 65-70%, was brought to the ER after patient was found to be weak. Patient was having dinner at a restaurant when patient was found to be weak and diaphoretic. As per the ER physician and nursing notes patient was hypotensive on arrival. Improved with fluids. Patient denies any chest pain or shortness of breath. On exam patient is nonfocal. CT head is negative for anything acute. Patient was recently placed on Cipro for UTI 2 days ago. UA shows features consistent with UTI. Patient states that she has been having recurrent falls and has history of gait difficulties. Denies any recent fall but has imbalance. Patient will be admitted for further observation.   Review of Systems: As presented in the history of presenting illness, rest negative.  Past Medical History  Diagnosis Date  . Nonischemic cardiomyopathy [facility]) 2007 / Feb 2009    Miminial non-obs CAD cath (2007); EF previously 25%;  55% by echo 2012;  Echo 4/14:  mild LVH; mod asymmetric hypertrophy of the septum, EF 55-60%, Gr 1 DD, AVR ok (mean gradient 9), MAC, mild LAE, PASP 31  . Aortic insufficiency October 2007    Severe with ascending thoracic aneurysm; s/p pericardial tissue AVR and Heamsheild graft in October 2007; c/b pericardial effusion requiring window;  echo 03/2010: inferobasal HK, severe LVH, EF 55-60%, dynamic obstruction during Valsalva in mid cavity, peak velocity 232 cm/sec, peak gradient 22 mmHg, AVR ok, mod dilated aorta, mild LAE  .  Hyperlipidemia   . Hypertension   . GERD (gastroesophageal reflux disease)   . History of cholelithiasis     Gallbladder dysfunction  . History of non anemic vitamin B12 deficiency   . Allergic rhinitis   . TIA (transient ischemic attack) October 2010  . Brain cancer ([place])   . CNS lymphoma ([place])   . History of recent fall 09/2013+    > 2 falls year   Past Surgical History  Procedure Laterality Date  . Tonsillectomy    . Aortic valve replacement  2007  . Bilateral vein stripping    . Left temporal tumor resected      B cell lymphoma   Social History:  reports that she has never smoked. She has never used smokeless tobacco. She reports that she does not drink alcohol or use illicit drugs. Where does patient live home. Can patient participate in ADLs? Yes.  No Known Allergies  Family History:  Family History  Problem Relation Age of Onset  . Stroke Father   . Arthritis Other   . Coronary artery disease Other     Female 1st degree relative <50  . Diabetes Other   . Cerebral aneurysm Son       Prior to Admission medications   Medication Sig Start Date End Date Taking? Authorizing Provider  cholecalciferol (VITAMIN D) 1000 UNITS tablet Take 1 tablet (1,000 Units total) by mouth daily. 05/01/14 05/01/15  Aleksei Plotnikov V, MD  ciprofloxacin (CIPRO) 250 MG tablet Take 1 tablet (250 mg total) by mouth 2 (two) times daily. 02/24/15   Cassandria Anger, MD  Cyanocobalamin (VITAMIN B-12)  1000 MCG SUBL Place 1 tablet (1,000 mcg total) under the tongue daily. 09/19/13   Aleksei Plotnikov V, MD  folic acid (FOLVITE) 1 MG tablet Take 1 tablet (1 mg total) by mouth daily. 09/29/14   Aleksei Plotnikov V, MD  furosemide (LASIX) 20 MG tablet Take 1 tablet (20 mg total) by mouth daily as needed. Patient taking differently: Take 20 mg by mouth daily as needed for fluid.  08/06/12   Minus Breeding, MD  ipratropium (ATROVENT) 0.06 % nasal spray Place 2 sprays into the nose 3 (three) times  daily. Patient not taking: Reported on 01/08/2015 05/01/14   Aleksei Plotnikov V, MD  loratadine (CLARITIN) 10 MG tablet Take 1 tablet (10 mg total) by mouth daily. 05/02/12   Liliane Shi, PA-C  ramipril (ALTACE) 2.5 MG capsule Take 1 capsule (2.5 mg total) by mouth daily. 10/02/14   Minus Breeding, MD    Physical Exam: Filed Vitals:   02/25/15 2315 02/25/15 2330 02/26/15 0014 02/26/15 0100  BP: 143/89 140/86 156/112   Pulse: 79 76 80   Temp:   98.1 F (36.7 C)   TempSrc:   Oral   Resp: 16 16 16    Height:    5\' 4"  (1.626 m)  Weight:   56.836 kg (125 lb 4.8 oz)   SpO2: 94% 93% 99%      General:  Moderately built and nourished.  Eyes: Anicteric no pallor.  ENT: No discharge from the ears eyes nose or mouth.  Neck: No mass felt. No neck rigidity.  Cardiovascular: S1 and S2 heard.  Respiratory: No rhonchi or crepitations.  Abdomen: Soft nontender bowel sounds present.  Skin: No rash.  Musculoskeletal: No edema.  Psychiatric: Appears normal.  Neurologic: Alert awake oriented to time place and person. Moves all extremities 5 x 5.  Labs on Admission:  Basic Metabolic Panel:  Recent Labs Lab 02/25/15 2103  NA 137  K 4.5  CL 102  CO2 24  GLUCOSE 143*  BUN 30*  CREATININE 1.37*  CALCIUM 9.0   Liver Function Tests:  Recent Labs Lab 02/25/15 2103  AST 17  ALT 10*  ALKPHOS 81  BILITOT 0.6  PROT 6.1*  ALBUMIN 3.6   No results for input(s): LIPASE, AMYLASE in the last 168 hours. No results for input(s): AMMONIA in the last 168 hours. CBC:  Recent Labs Lab 02/25/15 2103  WBC 7.4  NEUTROABS 5.4  HGB 12.8  HCT 37.8  MCV 103.0*  PLT 189   Cardiac Enzymes: No results for input(s): CKTOTAL, CKMB, CKMBINDEX, TROPONINI in the last 168 hours.  BNP (last 3 results) No results for input(s): BNP in the last 8760 hours.  ProBNP (last 3 results) No results for input(s): PROBNP in the last 8760 hours.  CBG: No results for input(s): GLUCAP in the last 168  hours.  Radiological Exams on Admission: Dg Chest 2 View  02/25/2015  CLINICAL DATA:  Eating dinner and became weak. EXAM: CHEST  2 VIEW COMPARISON:  03/19/2014 FINDINGS: Previous median sternotomy and CABG procedure. Aortic atherosclerosis noted. Aneurysmal dilatation of the ascending thoracic aorta is again noted and appears similar in size to 2/20 2/16. No pleural effusion identified. No airspace consolidation or atelectasis. Degenerative disc disease is noted throughout the thoracic spine. IMPRESSION: No active cardiopulmonary disease. Similar appearance of ascending aortic aneurysm. Electronically Signed   By: Kerby Moors M.D.   On: 02/25/2015 21:32   Ct Head Wo Contrast  02/25/2015  CLINICAL DATA:  82 year old female with  weakness. EXAM: CT HEAD WITHOUT CONTRAST TECHNIQUE: Contiguous axial images were obtained from the base of the skull through the vertex without intravenous contrast. COMPARISON:  Head CT 01/08/2015 FINDINGS: No intracranial hemorrhage, mass effect, or midline shift. Left temporal craniotomy with subjacent encephalomalacia, stable. Colloid cyst measures 7 mm, unchanged in size in appearance from prior allowing for differences in caliper placement. Atrophy and chronic small vessel ischemia unchanged. Prior lacunar infarct in the left cerebellum and probable left periventricular frontal lobe, stable. No hydrocephalus. The basilar cisterns are patent. No evidence of territorial infarct. No intracranial fluid collection. Calvarium is intact. Included paranasal sinuses and mastoid air cells are well aerated. IMPRESSION: 1.  No acute intracranial abnormality. 2. Stable postsurgical and chronic change. Electronically Signed   By: Jeb Levering M.D.   On: 02/25/2015 23:14    EKG: Independently reviewed. Normal sinus rhythm with APCs.  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 Near syncope - patient was orthostatic on admission. Possibly causing her syncope. Gently hydrate at this time. Since patient's last 2-D echo done in September shows possible aortic stenosis we will recheck 2-D echo. Given the history of CNS lymphoma MRI brain has been ordered. Get physical therapy consult. Social work consult. 2. UTI - follow urine cultures. Patient on ceftriaxone. 3. Chronic kidney disease with mild worsening of creatinine states 3 - closely follow metabolic panel. 4. Hypertension on lisinopril. 5. Bioprosthetic aortic valve replacement and history of ASD repair - follow 2-D echo. 6. History of CNS lymphoma status post surgery in remission.   DVT Prophylaxis Lovenox.  Code Status: DO NOT RESUSCITATE.  Family Communication: Discussed with patient.  Disposition Plan: Admit for observation.    Aeden Matranga N. [facility] Pager [phone].  If 7PM-7AM, please contact night-coverage [email or web address] Password [email or web address] 02/26/2015, 3:29 AM

## 2015-02-26 NOTE — Progress Notes (Signed)
  Echocardiogram 2D Echocardiogram has been performed.  Donata Clay 02/26/2015, 3:50 PM

## 2015-02-26 NOTE — Progress Notes (Signed)
   Subjective:    Patient ID: [name], female    DOB: July 14, 1919, 82 y.o.   MRN: [id number]  HPI    Review of Systems     Objective:   Physical Exam        Assessment & Plan:  Patient called and cancelled

## 2015-02-26 NOTE — Progress Notes (Signed)
Patient arrived on unit via stretcher. Patient alert and oriented x3. Patient oriented to staff, room and unit. Skin assessment completed, No skin issues noted. Patient's IV clean, dry and intact. Patient rates pain 0/10. Safety Fall Prevention Plan was given, discussed and signed by patient. On call admission MD informed about patient's arrival. RN awaiting orders. RN will continue to monitor the patient. Call light has been placed within reach and bed alarm has been activated.   Nena Polio BSN, RN  Phone Number: [phone]

## 2015-02-26 NOTE — Progress Notes (Signed)
Notified on call NP Rogue Bussing concerning b/p this tonight. Awaiting any further orders.

## 2015-02-26 NOTE — Progress Notes (Signed)
Patient seen and evaluated earlier this AM. Please refer to H and P for details. MRI of brain reporting stroke. Will d/c antihypertensive medication and consult Neurology.  Gen: Pt in nad, alert and awake CV: s1 and s2 wnl, no rubs Pulm: no increased wob  Will reassess next am  [place], [name]

## 2015-02-26 NOTE — Evaluation (Signed)
Physical Therapy Evaluation Patient Details Name: [name] MRN: [id number] DOB: Sep 09, 1919 Today's Date: 02/26/2015   History of Present Illness  [name] is a 82 y.o. female history of CNS lymphoma status post surgery in remission, bioprosthetic aortic valve replacement and ASD repair, previous history of nonischemic cardiomyopathy, came due ED due to weakness and diaphoresis. MRI reveals acute right ACA territory infarct involving the cingulate.  Clinical Impression  Pt admitted with above complications. Pt currently with functional limitations due to the deficits listed below (see PT Problem List). Reports balance has been progressively worsening over the past 2 weeks and her falls have become more frequent. Today she demonstrates impaired cognition with disorientation to month/year and delayed processing with motor and cognitive tasks. Several instances of loss of balance requiring up to mod assist to prevent falls. She lives alone. Pt will benefit from skilled PT to increase their independence and safety with mobility to allow discharge to the venue listed below.       Follow Up Recommendations SNF;Supervision/Assistance - 24 hour    Equipment Recommendations  None recommended by PT    Recommendations for Other Services       Precautions / Restrictions Precautions Precautions: Fall Restrictions Weight Bearing Restrictions: No      Mobility  Bed Mobility Overal bed mobility: Needs Assistance Bed Mobility: Supine to Sit     Supine to sit: Min guard     General bed mobility comments: close guard for safety. Some posterior loss of balance but able to self correct with cues while rising to EOB.  Transfers Overall transfer level: Needs assistance Equipment used: Rolling walker (2 wheeled);None Transfers: Sit to/from Stand Sit to Stand: Min assist         General transfer comment: Min assist for balance. Pt leaning heavily to posterior. Minimally improved  with use of RW for support. Good power up. VC for anterior weight shift and hand placement. Practiced x3 from bed.  Ambulation/Gait Ambulation/Gait assistance: Mod assist Ambulation Distance (Feet): 95 Feet Assistive device: Rolling walker (2 wheeled);None Gait Pattern/deviations: Step-through pattern;Decreased stride length;Staggering left;Staggering right;Drifts right/left Gait velocity: slow Gait velocity interpretation: <1.8 ft/sec, indicative of risk for recurrent falls General Gait Details: Fairly stable with RW for support but occasionally leaning backwards requiring assist and cues for anterior weight shift. Without assistive device pt requires mod assist at times to prevent falls. Imbalance exacerbated by vertical/horizontal head turns. VC for awareness and safety.  Stairs            Wheelchair Mobility    Modified Rankin (Stroke Patients Only) Modified Rankin (Stroke Patients Only) Pre-Morbid Rankin Score: Moderate disability Modified Rankin: Moderately severe disability     Balance Overall balance assessment: Needs assistance;History of Falls Sitting-balance support: No upper extremity supported;Feet supported Sitting balance-Leahy Scale: Fair   Postural control: Posterior lean Standing balance support: No upper extremity supported Standing balance-Leahy Scale: Fair                               Pertinent Vitals/Pain Pain Assessment: No/denies pain    Home Living Family/patient expects to be discharged to:: Private residence Living Arrangements: Alone Available Help at Discharge: Other (Comment) (reports no assist) Type of Home: House Home Access: Ramped entrance (in garage) Entrance Stairs-Rails: Left   Home Layout: Two level;Able to live on main level with bedroom/bathroom Home Equipment: Gilford Rile - 2 wheels;Cane - single point;Bedside commode;Tub bench  Prior Function Level of Independence: Independent with assistive device(s)          Comments: Uses RW for gait, noticed more reliant on RW past 2 weeks. Falls frequently and has had more recently.     Hand Dominance   Dominant Hand: Right    Extremity/Trunk Assessment   Upper Extremity Assessment: Defer to OT evaluation           Lower Extremity Assessment: Generalized weakness (No overt focal deficits noted)         Communication   Communication: No difficulties  Cognition Arousal/Alertness: Awake/alert Behavior During Therapy: WFL for tasks assessed/performed Overall Cognitive Status: Impaired/Different from baseline Area of Impairment: Orientation;Following commands;Safety/judgement;Problem solving Orientation Level: Disoriented to;Time (unsure of month and states it is 3)     Following Commands: Follows one step commands consistently;Follows one step commands with increased time Safety/Judgement: Decreased awareness of deficits   Problem Solving: Slow processing;Difficulty sequencing;Requires verbal cues      General Comments      Exercises        Assessment/Plan    PT Assessment Patient needs continued PT services  PT Diagnosis Difficulty walking;Abnormality of gait;Generalized weakness;Altered mental status   PT Problem List Decreased strength;Decreased activity tolerance;Decreased balance;Decreased mobility;Decreased coordination;Decreased cognition;Decreased knowledge of precautions  PT Treatment Interventions DME instruction;Gait training;Functional mobility training;Therapeutic activities;Therapeutic exercise;Balance training;Neuromuscular re-education;Cognitive remediation;Patient/family education   PT Goals (Current goals can be found in the Care Plan section) Acute Rehab PT Goals Patient Stated Goal: Go home PT Goal Formulation: With patient Time For Goal Achievement: 03/12/15 Potential to Achieve Goals: Good    Frequency Min 4X/week   Barriers to discharge Decreased caregiver support lives alone    Co-evaluation                End of Session Equipment Utilized During Treatment: Gait belt Activity Tolerance: Patient tolerated treatment well Patient left: in chair;with call bell/phone within reach;with chair alarm set;with family/visitor present Nurse Communication: Mobility status    Functional Assessment Tool Used: clinical observation Functional Limitation: Mobility: Walking and moving around Mobility: Walking and Moving Around Current Status [id number]): At least 20 percent but less than 40 percent impaired, limited or restricted Mobility: Walking and Moving Around Goal Status [phone]): At least 1 percent but less than 20 percent impaired, limited or restricted    Time: 1002-1036 PT Time Calculation (min) (ACUTE ONLY): 34 min   Charges:   PT Evaluation $PT Eval Moderate Complexity: 1 Procedure PT Treatments $Gait Training: 8-22 mins   PT G Codes:   PT G-Codes **NOT FOR INPATIENT CLASS** Functional Assessment Tool Used: clinical observation Functional Limitation: Mobility: Walking and moving around Mobility: Walking and Moving Around Current Status [id number]): At least 20 percent but less than 40 percent impaired, limited or restricted Mobility: Walking and Moving Around Goal Status [phone]): At least 1 percent but less than 20 percent impaired, limited or restricted    Ellouise Newer 02/26/2015, 11:39 AM  Elayne Snare, [place]

## 2015-02-27 ENCOUNTER — Encounter (HOSPITAL_COMMUNITY): Payer: Medicare Other

## 2015-02-27 DIAGNOSIS — E785 Hyperlipidemia, unspecified: Secondary | ICD-10-CM | POA: Insufficient documentation

## 2015-02-27 LAB — HEMOGLOBIN A1C
Hgb A1c MFr Bld: 6 % — ABNORMAL HIGH (ref 4.8–5.6)
MEAN PLASMA GLUCOSE: 126 mg/dL

## 2015-02-27 LAB — LIPID PANEL
CHOL/HDL RATIO: 4.1 ratio
CHOLESTEROL: 163 mg/dL (ref 0–200)
HDL: 40 mg/dL — AB (ref >40)
LDL Cholesterol: 102 mg/dL — ABNORMAL HIGH (ref 0–99)
Triglycerides: 105 mg/dL (ref <150)
VLDL: 21 mg/dL (ref 0–40)

## 2015-02-27 MED ORDER — SODIUM CHLORIDE 0.9% FLUSH: 3.0000 mL | INTRAVENOUS | Status: DC | PRN

## 2015-02-27 MED ORDER — SODIUM CHLORIDE 0.9% FLUSH
3.0000 mL | Freq: Two times a day (BID) | INTRAVENOUS | Status: DC
  Administered 2015-02-27 – 2015-03-02 (×6): 3 mL via INTRAVENOUS

## 2015-02-27 MED ORDER — ASPIRIN EC 81 MG PO TBEC
81.0000 mg | DELAYED_RELEASE_TABLET | Freq: Every day | ORAL | Status: DC
  Administered 2015-02-27 – 2015-03-02 (×4): 81 mg via ORAL
  Filled 2015-02-27 (×4): qty 1

## 2015-02-27 MED ORDER — SODIUM CHLORIDE 0.9 % IV SOLN: 250.0000 mL | INTRAVENOUS | Status: DC | PRN

## 2015-02-27 MED ORDER — ATORVASTATIN CALCIUM 20 MG PO TABS
20.0000 mg | ORAL_TABLET | Freq: Every day | ORAL | Status: DC
  Administered 2015-02-27 – 2015-03-01 (×3): 20 mg via ORAL
  Filled 2015-02-27 (×3): qty 1

## 2015-02-27 NOTE — Progress Notes (Signed)
[facility] PROGRESS NOTE  [name] [id number] DOB: 1919-04-30 DOA: 02/25/2015 PCP: Walker Kehr, MD  Assessment/Plan: Principal Problem:   Acute Stroke - Consulted Neurology - Patient undergoing further work up - Will await final recommendations from neurology prior to discharge. - currently on aspirin and lipitor  Active Problems:   Essential hypertension - allow for permissive htn at this time    Chronic diastolic heart failure ([place]) - compensated currently    S/P AVR   EBV (-) primary lymphoma of brain ([place]) -in lieu of principle problem and     UTI (urinary tract infection) - stable on rocephin. Will continue      Hyperlipidemia - continue statin  Code Status: DNR Family Communication: d/c patient and family Disposition Plan: Most likely SNF   Consultants:  Neurology  Procedures:  None  Antibiotics:  Rocephin  HPI/Subjective: Pt has no new complaints.   Objective: Filed Vitals:   02/27/15 0530 02/27/15 0837  BP: 158/89 147/82  Pulse: 86 98  Temp: 98.6 F (37 C) 98 F (36.7 C)  Resp: 19 18    Intake/Output Summary (Last 24 hours) at 02/27/15 1511 Last data filed at 02/27/15 1216  Gross per 24 hour  Intake    265 ml  Output    925 ml  Net   -660 ml   Filed Weights   02/26/15 0014 02/26/15 2120  Weight: 56.836 kg (125 lb 4.8 oz) 57.153 kg (126 lb)    Exam:   General:  Pt in nad, alert and awake  Cardiovascular: rrr, no rubs  Respiratory: no increased wob, no wheezes  Abdomen: soft, nd, nt  Musculoskeletal: no cyanosis   Data Reviewed: Basic Metabolic Panel:  Recent Labs Lab 02/25/15 2103 02/26/15 0540  NA 137 137  K 4.5 4.2  CL 102 102  CO2 24 25  GLUCOSE 143* 112*  BUN 30* 27*  CREATININE 1.37* 1.12*  CALCIUM 9.0 9.0   Liver Function Tests:  Recent Labs Lab 02/25/15 2103 02/26/15 0540  AST 17 16  ALT 10* 11*  ALKPHOS 81 78  BILITOT 0.6 0.8  PROT 6.1* 5.8*  ALBUMIN 3.6 3.2*   No  results for input(s): LIPASE, AMYLASE in the last 168 hours. No results for input(s): AMMONIA in the last 168 hours. CBC:  Recent Labs Lab 02/25/15 2103 02/26/15 0540  WBC 7.4 5.1  NEUTROABS 5.4 3.0  HGB 12.8 12.3  HCT 37.8 36.0  MCV 103.0* 100.6*  PLT 189 177   Cardiac Enzymes:  Recent Labs Lab 02/26/15 0540  TROPONINI 0.03   BNP (last 3 results) No results for input(s): BNP in the last 8760 hours.  ProBNP (last 3 results) No results for input(s): PROBNP in the last 8760 hours.  CBG:  Recent Labs Lab 02/26/15 0852 02/26/15 1630  GLUCAP 103* 98    No results found for this or any previous visit (from the past 240 hour(s)).   Studies: Dg Chest 2 View  02/25/2015  CLINICAL DATA:  Eating dinner and became weak. EXAM: CHEST  2 VIEW COMPARISON:  03/19/2014 FINDINGS: Previous median sternotomy and CABG procedure. Aortic atherosclerosis noted. Aneurysmal dilatation of the ascending thoracic aorta is again noted and appears similar in size to 2/20 2/16. No pleural effusion identified. No airspace consolidation or atelectasis. Degenerative disc disease is noted throughout the thoracic spine. IMPRESSION: No active cardiopulmonary disease. Similar appearance of ascending aortic aneurysm. Electronically Signed   By: Kerby Moors M.D.   On: 02/25/2015 21:32  Ct Head Wo Contrast  02/25/2015  CLINICAL DATA:  82 year old female with weakness. EXAM: CT HEAD WITHOUT CONTRAST TECHNIQUE: Contiguous axial images were obtained from the base of the skull through the vertex without intravenous contrast. COMPARISON:  Head CT 01/08/2015 FINDINGS: No intracranial hemorrhage, mass effect, or midline shift. Left temporal craniotomy with subjacent encephalomalacia, stable. Colloid cyst measures 7 mm, unchanged in size in appearance from prior allowing for differences in caliper placement. Atrophy and chronic small vessel ischemia unchanged. Prior lacunar infarct in the left cerebellum and probable  left periventricular frontal lobe, stable. No hydrocephalus. The basilar cisterns are patent. No evidence of territorial infarct. No intracranial fluid collection. Calvarium is intact. Included paranasal sinuses and mastoid air cells are well aerated. IMPRESSION: 1.  No acute intracranial abnormality. 2. Stable postsurgical and chronic change. Electronically Signed   By: Jeb Levering M.D.   On: 02/25/2015 23:14   Mr Brain Wo Contrast  02/26/2015  CLINICAL DATA:  Increased weakness. Dizziness. 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 EXAM: MRI HEAD WITHOUT CONTRAST TECHNIQUE: Multiplanar, multiecho pulse sequences of the brain and surrounding structures were obtained without intravenous contrast. COMPARISON:  Head CT 02/25/2015 and MRI 11/11/2013 FINDINGS: There is a 1 cm focus of restricted diffusion in the right cingulate gyrus consistent with acute ACA territory infarct. A few adjacent punctate foci of restricted diffusion are also noted more posteriorly. A small curvilinear focus of FLAIR hyperintensity in the interhemispheric fissure in this region may reflect slow flow or thrombosis of an ACA branch vessel responsible for this infarct. Multiple chronic microhemorrhages in both cerebellar hemispheres as well as a few scattered cerebral hemispheric microhemorrhages are unchanged from the prior MRI. Chronic infarcts are again seen in the right thalamus and left cerebellum. There are also chronic infarcts in the right cerebellum and left frontal white matter which are new from the prior MRI. Patchy to confluent T2 hyperintensities in the subcortical and deep cerebral white matter bilaterally have slightly progressed from the prior MRI and are nonspecific but compatible with moderate chronic small vessel ischemic disease. Sequelae of left temporal craniotomy are again identified with unchanged underlying left temporal lobe encephalomalacia. No mass is identified on this  unenhanced study. There is no midline shift or extra-axial fluid collection. Prior bilateral cataract extraction is noted. A right maxillary sinus mucous retention cyst is again seen. There is an unchanged, trace right mastoid effusion. Major intracranial vascular flow voids are preserved. IMPRESSION: 1. Small, acute right ACA territory infarct involving the cingulate gyrus. 2. Moderate chronic small vessel ischemic disease, progressed from 2015 with additional chronic infarcts as above. Electronically Signed   By: Logan Bores M.D.   On: 02/26/2015 08:59    Scheduled Meds: . aspirin EC  81 mg Oral Daily  . atorvastatin  20 mg Oral q1800  . cefTRIAXone (ROCEPHIN)  IV  1 g Intravenous Q24H  . cholecalciferol  1,000 Units Oral Daily  . enoxaparin (LOVENOX) injection  30 mg Subcutaneous Q24H  . folic acid  1 mg Oral Daily  . ipratropium  2 spray Nasal TID  . loratadine  10 mg Oral Daily  . sodium chloride flush  3 mL Intravenous Q12H  . vitamin B-12  1,000 mcg Oral Daily   Continuous Infusions:    Time spent: > 35 minutes  Velvet Bathe  [facility] Pager [phone]. If 7PM-7AM, please contact night-coverage at [email or web address], password [email or web address] 02/27/2015, 3:11 PM  LOS: 1 day

## 2015-02-27 NOTE — Progress Notes (Addendum)
STROKE TEAM PROGRESS NOTE   HISTORY AT THE TIME OF ADMISSION [name] is an 82 y.o. female history of CNS lymphoma status post surgery in remission, bioprosthetic aortic valve replacement and ASD repair, previous history of nonischemic cardiomyopathy improved last EF measured in September 2016 was 65-70%, was brought to the ER after patient was found to be weak. Patient was having dinner at a restaurant when patient was found to be weak and diaphoretic. As per the ER physician and nursing notes patient was hypotensive on arrival. Improved with fluids. Per friend, she was out eating dinner with a man friend and became "pale and diaphoretic. EMS was called and she was brought to ED. She had generalized weakness and improved with fluids." MRI was obtained and does show a right ACA stroke. ECG shows PAC but no Afib. OF note, she was recently diagnosed with UTI and placed on Cipro. Had only taken 2 pills.   Date last known well: 2.2.2017 Time last known well: Time: 20:00 tPA Given: No: out of window   SUBJECTIVE (INTERVAL HISTORY) Her family is not at the bedside.  Overall she feels her condition is unchanged. She recalls feeling unwell at the restaurant and the ambulance bringing her to the hospital.  She states feeling confused about the "other details"   OBJECTIVE Temp:  [97.7 F (36.5 C)-98.8 F (37.1 C)] 98.6 F (37 C) (02/04 0530) Pulse Rate:  [70-90] 86 (02/04 0530) Cardiac Rhythm:  [-]  Resp:  [17-19] 19 (02/04 0530) BP: (133-194)/(81-103) 158/89 mmHg (02/04 0530) SpO2:  [96 %-98 %] 98 % (02/04 0530) Weight:  [57.153 kg (126 lb)] 57.153 kg (126 lb) (02/03 2120)  CBC:  Recent Labs Lab 02/25/15 2103 02/26/15 0540  WBC 7.4 5.1  NEUTROABS 5.4 3.0  HGB 12.8 12.3  HCT 37.8 36.0  MCV 103.0* 100.6*  PLT 189 [id number]    Basic Metabolic Panel:  Recent Labs Lab 02/25/15 2103 02/26/15 0540  NA 137 137  K 4.5 4.2  CL 102 102  CO2 24 25  GLUCOSE 143* 112*  BUN 30* 27*   CREATININE 1.37* 1.12*  CALCIUM 9.0 9.0    Lipid Panel:    Component Value Date/Time   CHOL 163 02/27/2015 0536   TRIG 105 02/27/2015 0536   HDL 40* 02/27/2015 0536   CHOLHDL 4.1 02/27/2015 0536   VLDL 21 02/27/2015 0536   LDLCALC 102* 02/27/2015 0536   HgbA1c:  Lab Results  Component Value Date   HGBA1C 6.0* 02/26/2015   Urine Drug Screen:    Component Value Date/Time   LABOPIA NONE DETECTED 02/25/2015 2203   COCAINSCRNUR NONE DETECTED 02/25/2015 2203   LABBENZ NONE DETECTED 02/25/2015 2203   AMPHETMU NONE DETECTED 02/25/2015 2203   THCU NONE DETECTED 02/25/2015 2203   LABBARB NONE DETECTED 02/25/2015 2203      IMAGING  Dg Chest 2 View 02/25/2015   No active cardiopulmonary disease. Similar appearance of ascending aortic aneurysm.   Ct Head Wo Contrast 02/25/2015   1.  No acute intracranial abnormality.  2. Stable postsurgical and chronic change.   Mr Brain Wo Contrast 02/26/2015   1. Small, acute right ACA territory infarct involving the cingulate gyrus.  2. Moderate chronic small vessel ischemic disease, progressed from 2015 with additional chronic infarcts as above.   PHYSICAL EXAM HEENT- Normocephalic, no lesions, without obvious abnormality. Normal external eye and conjunctiva. Normal external nose, mucus membranes and septum. PERRL Cardiovascular- S1, S2 normal, pulses palpable throughout; loud harsh murmur c/w  prosthetic valves  Lungs- no tachypnea, retractions or cyanosis Abdomen- normal findings: bowel sounds normal Extremities- no edema  Neurological Examination Mental Status: Alert, oriented, thought content appropriate. Speech fluent without evidence of aphasia. Able to follow 3 step commands needing some help but able to follow simple commands without problems.   Cranial Nerves: II: Visual fields grossly normal, pupils equal, round, reactive to light and accommodation III,IV, VI: extra-ocular motions intact  V,VII: smile symmetric, facial  light touch sensation normal bilaterally VIII: hearing grossly intact IX,X: uvula rises symmetrically XI: bilateral shoulder shrug XII: midline tongue extension  Motor: Right :Upper extremity 5/5Left: Upper extremity 5/5 Lower extremity 5/5Lower extremity 5/5  Sensory: Light touch intact throughout, bilaterally  Cerebellar: normal finger-to-nose, and normal heel-to-shin test  Gait: not tested  ASSESSMENT/PLAN [name] is a 82 y.o. female with history of nonischemic cardiomyopathy, hyperlipidemia, hypertension, previous TIA, previous strokes, S/P bioprosthetic aortic valve replacement with ASD repair and CNS lymphoma presenting with weakness and hypotension. She did not receive IV t-PA due to late presentation.   Stroke:  Non-dominant small, acute right ACA territory infarct secondary to hypotension.  Resultant  Transient decrease in responsiveness  MRI - Small, acute right ACA territory infarct involving the cingulate gyrus as well as remote infarcts.  MRA - Not performed.  Carotid Doppler  - Will order.  2D Echo - EF 60-65%. No cardiac source of emboli identified.   LDL  - 102; will start statin  HgbA1c - 6.0  VTE prophylaxis - Lovenox  Diet Heart Room service appropriate?: Yes; Fluid consistency:: Thin  No antithrombotic prior to admission, now on No antithrombotic Will order Aspirin 81 mg daily.  Patient counseled to be compliant with her antithrombotic medications  Ongoing aggressive stroke risk factor management  Therapy recommendations: - SNF recommended.  Disposition: pending  Hypertension  Stable  Permissive hypertension (OK if < 220/120) but gradually normalize in 5-7 days  Hyperlipidemia  Home meds: No lipid lowering medications prior to admission.  LDL 102, goal < 70  Consider low-dose statin  Continue statin at  discharge  Other Stroke Risk Factors  Advanced age  Hx stroke/TIA  Family hx stroke (Father)  Other Active Problems  Renal insufficiency  Recent fall  Macrocytic anemia; on [facility] day # 1  Mikey Bussing PA-C [facility] Pager [phone] 02/27/2015, 11:01 AM  ATTENDING NOTE: Patient was seen and examined by me personally. Documentation reflects findings. The laboratory and radiographic studies reviewed by me. ROS completed by me personally and pertinent positives fully documented  Condition: Stable   Assessment and plan completed by me personally and fully documented above. Plans/Recommendations include:     Will continue to follow until stroke w/u complete  Started lipitor  May wish to further evaluate macrocytic anemia  Had a long talk regarding discharge disposition with Doni Sunderland the HCPOA [phone] [facility]) [phone] (cell). He will be traveling over the next few days.  He stated that if we have trouble contacting him, please feel free to call his sister France Ravens [phone] [facility]).  Patient's husband apparently went to [place] years ago.  Son inquired about this as an option    SIGNED BY: Dr. Elissa Hefty     To contact Stroke Continuity provider, please refer to [email or web address] After hours, contact General Neurology

## 2015-02-28 ENCOUNTER — Inpatient Hospital Stay (HOSPITAL_COMMUNITY): Payer: Medicare Other

## 2015-02-28 DIAGNOSIS — I639 Cerebral infarction, unspecified: Secondary | ICD-10-CM

## 2015-02-28 NOTE — NC FL2 (Signed)
[place] MEDICAID FL2 LEVEL OF CARE SCREENING TOOL     IDENTIFICATION  Patient Name: [name] Birthdate: 09-29-1919 Sex: female Admission Date (Current Location): 02/25/2015  [facility] and [place] Number:  Herbalist and Address:  [facility] [place], [place] [address], [place], [place] [address]      Provider Number: [id number]  Attending Physician Name and Address:  Velvet Bathe, MD  Relative Name and Phone Number:       Current Level of Care: Hospital Recommended Level of Care: [place] Prior Approval Number:    Date Approved/Denied: 02/28/15 PASRR Number: [id number]  Discharge Plan: SNF    Current Diagnoses: Patient Active Problem List   Diagnosis Date Noted  . Hyperlipidemia   . Near syncope 02/26/2015  . Acute CVA (cerebrovascular accident) ([place]) 02/26/2015  . UTI (lower urinary tract infection)   . Dehydration 02/25/2015  . Fall at home 01/06/2015  . Syncope and collapse 01/06/2015  . UTI (urinary tract infection) 10/23/2014  . Dementia 10/23/2014  . Well adult exam 05/01/2014  . AAA (abdominal aortic aneurysm) without rupture ([place]) 03/20/2014  . Fatigue 09/19/2013  . Unspecified venous (peripheral) insufficiency 05/06/2012  . DJD (degenerative joint disease) 05/06/2012  . Delusions ([place]) 11/20/2011  . Chest pain 03/20/2011  . 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 S/P AVR 11/17/2010  . HEADACHE, TENSION 04/03/2010  . UNSPECIFIED PERIPHERAL VASCULAR DISEASE 04/03/2010  . PARESTHESIA, HANDS 04/03/2010  . Chronic diastolic heart failure ([place]) 11/23/2009  . GAIT IMBALANCE 11/23/2009  . RHINITIS 04/11/2008  . HYPERLIPIDEMIA 03/18/2007  . ANEMIA, PERNICIOUS 03/18/2007  . ANXIETY 03/18/2007  . DEPRESSION 03/18/2007  . IBS 03/18/2007  . DYSPNEA 03/18/2007  . AORTIC REGURGITATION 09/25/2006  . HEMORRHOIDS 09/25/2006  . ANEMIA, B12 DEFICIENCY 06/29/2006  . Essential hypertension 05/04/2006     Orientation RESPIRATION BLADDER Height & Weight     Self, Place  Normal Incontinent Weight: 124 lb 12.5 oz (56.6 kg) Height:  5\' 4"  (162.6 cm)  BEHAVIORAL SYMPTOMS/MOOD NEUROLOGICAL BOWEL NUTRITION STATUS      Continent Diet (Heart Healthy )  AMBULATORY STATUS COMMUNICATION OF NEEDS Skin   Extensive Assist Verbally Normal                       Personal Care Assistance Level of Assistance  Bathing, Feeding, Dressing Bathing Assistance: Maximum assistance Feeding assistance: Independent Dressing Assistance: Maximum assistance     Functional Limitations Info  Sight, Hearing, Speech Sight Info: Adequate Hearing Info: Adequate Speech Info: Adequate    SPECIAL CARE FACTORS FREQUENCY  PT (By licensed PT), OT (By licensed OT)                    Contractures      Additional Factors Info  Code Status, Allergies Code Status Info: DNR Allergies Info: Aricept           Current Medications (02/28/2015):  This is the current hospital active medication list Current Facility-Administered Medications  Medication Dose Route Frequency Provider Last Rate Last Dose  . 0.9 %  sodium chloride infusion  250 mL Intravenous PRN Dianne Dun, NP      . acetaminophen (TYLENOL) tablet 650 mg  650 mg Oral Q6H PRN Rise Patience, MD       Or  . acetaminophen (TYLENOL) suppository 650 mg  650 mg Rectal Q6H PRN Rise Patience, MD      .  aspirin EC tablet 81 mg  81 mg Oral Daily David L Rinehuls, PA-C   81 mg at 02/28/15 0909  . atorvastatin (LIPITOR) tablet 20 mg  20 mg Oral q1800 David L Rinehuls, PA-C   20 mg at 02/27/15 1730  . cefTRIAXone (ROCEPHIN) 1 g in dextrose 5 % 50 mL IVPB  1 g Intravenous Q24H Erenest Blank, RPH   1 g at 02/27/15 2242  . cholecalciferol (VITAMIN D) tablet 1,000 Units  1,000 Units Oral Daily Rise Patience, MD   1,000 Units at 02/28/15 [phone]  . enoxaparin (LOVENOX) injection 30 mg  30 mg Subcutaneous Q24H Rise Patience, MD    30 mg at 02/28/15 0909  . folic acid (FOLVITE) tablet 1 mg  1 mg Oral Daily Rise Patience, MD   1 mg at 02/28/15 0909  . hydrALAZINE (APRESOLINE) injection 5 mg  5 mg Intravenous Q6H PRN Dianne Dun, NP   5 mg at 02/28/15 [id number]  . ipratropium (ATROVENT) 0.06 % nasal spray 2 spray  2 spray Nasal TID Rise Patience, MD   2 spray at 02/28/15 0909  . loratadine (CLARITIN) tablet 10 mg  10 mg Oral Daily Rise Patience, MD   10 mg at 02/28/15 0909  . ondansetron (ZOFRAN) tablet 4 mg  4 mg Oral Q6H PRN Rise Patience, MD       Or  . ondansetron [facility]) injection 4 mg  4 mg Intravenous Q6H PRN Rise Patience, MD      . sodium chloride flush (NS) 0.9 % injection 3 mL  3 mL Intravenous Q12H Dianne Dun, NP   3 mL at 02/28/15 0915  . sodium chloride flush (NS) 0.9 % injection 3 mL  3 mL Intravenous PRN Dianne Dun, NP      . vitamin B-12 (CYANOCOBALAMIN) tablet 1,000 mcg  1,000 mcg Oral Daily Rise Patience, MD   1,000 mcg at 02/28/15 0909     Discharge Medications: Please see discharge summary for a list of discharge medications.  Relevant Imaging Results:  Relevant Lab Results:   Additional Information SS#: [id number]  Raymondo Band, LCSW

## 2015-02-28 NOTE — Progress Notes (Signed)
STROKE TEAM PROGRESS NOTE   HISTORY AT THE TIME OF ADMISSION [name] is an 82 y.o. female history of CNS lymphoma status post surgery in remission, bioprosthetic aortic valve replacement and ASD repair, previous history of nonischemic cardiomyopathy improved last EF measured in September 2016 was 65-70%, was brought to the ER after patient was found to be weak. Patient was having dinner at a restaurant when patient was found to be weak and diaphoretic. As per the ER physician and nursing notes patient was hypotensive on arrival. Improved with fluids. Per friend, she was out eating dinner with a man friend and became "pale and diaphoretic. EMS was called and she was brought to ED. She had generalized weakness and improved with fluids." MRI was obtained and does show a right ACA stroke. ECG shows PAC but no Afib. OF note, she was recently diagnosed with UTI and placed on Cipro. Had only taken 2 pills.   Date last known well: 2.2.2017 Time last known well: Time: 20:00 tPA Given: No: out of window   SUBJECTIVE (INTERVAL HISTORY) Her family is not at the bedside.  Overall she feels her condition is improving. She denies complaints to full ROS   OBJECTIVE Temp:  [97.6 F (36.4 C)-98.2 F (36.8 C)] 97.6 F (36.4 C) (02/05 0637) Pulse Rate:  [81-100] 88 (02/05 0637) Cardiac Rhythm:  [-] Normal sinus rhythm (02/04 1935) Resp:  [19-20] 20 (02/05 0637) BP: (136-177)/(88-105) 177/105 mmHg (02/05 0637) SpO2:  [97 %] 97 % (02/05 0637) Weight:  [56.6 kg (124 lb 12.5 oz)] 56.6 kg (124 lb 12.5 oz) (02/05 0637)  CBC:   Recent Labs Lab 02/25/15 2103 02/26/15 0540  WBC 7.4 5.1  NEUTROABS 5.4 3.0  HGB 12.8 12.3  HCT 37.8 36.0  MCV 103.0* 100.6*  PLT 189 [id number]    Basic Metabolic Panel:   Recent Labs Lab 02/25/15 2103 02/26/15 0540  NA 137 137  K 4.5 4.2  CL 102 102  CO2 24 25  GLUCOSE 143* 112*  BUN 30* 27*  CREATININE 1.37* 1.12*  CALCIUM 9.0 9.0    Lipid Panel:      Component Value Date/Time   CHOL 163 02/27/2015 0536   TRIG 105 02/27/2015 0536   HDL 40* 02/27/2015 0536   CHOLHDL 4.1 02/27/2015 0536   VLDL 21 02/27/2015 0536   LDLCALC 102* 02/27/2015 0536   HgbA1c:  Lab Results  Component Value Date   HGBA1C 6.0* 02/26/2015   Urine Drug Screen:     Component Value Date/Time   LABOPIA NONE DETECTED 02/25/2015 2203   COCAINSCRNUR NONE DETECTED 02/25/2015 2203   LABBENZ NONE DETECTED 02/25/2015 2203   AMPHETMU NONE DETECTED 02/25/2015 2203   THCU NONE DETECTED 02/25/2015 2203   LABBARB NONE DETECTED 02/25/2015 2203      IMAGING  Dg Chest 2 View 02/25/2015   No active cardiopulmonary disease. Similar appearance of ascending aortic aneurysm.   Ct Head Wo Contrast 02/25/2015   1.  No acute intracranial abnormality.  2. Stable postsurgical and chronic change.   Mr Brain Wo Contrast 02/26/2015   1. Small, acute right ACA territory infarct involving the cingulate gyrus.  2. Moderate chronic small vessel ischemic disease, progressed from 2015 with additional chronic infarcts as above.   PHYSICAL EXAM HEENT- Normocephalic, no lesions, without obvious abnormality. Normal external eye and conjunctiva. Normal external nose, mucus membranes and septum. PERRL Cardiovascular- S1, S2 normal, pulses palpable throughout; loud harsh murmur c/w prosthetic valves  Lungs- no tachypnea, retractions  or cyanosis Abdomen- normal findings: bowel sounds normal Extremities- no edema  Neurological Examination Mental Status: Alert, oriented, thought content appropriate. Speech fluent without evidence of aphasia. Able to follow 3 step commands needing some help but able to follow simple commands without problems.   Cranial Nerves: II: Visual fields grossly normal, pupils equal, round, reactive to light and accommodation III,IV, [name]: extra-ocular motions intact  V,VII: smile symmetric, facial light touch sensation normal bilaterally VIII: hearing  grossly intact IX,X: uvula rises symmetrically XI: bilateral shoulder shrug XII: midline tongue extension  Motor: Right :Upper extremity 5/5Left: Upper extremity 5/5 Lower extremity 5/5Lower extremity 5/5  Sensory: Light touch intact throughout, bilaterally  Cerebellar: normal finger-to-nose, and normal heel-to-shin test  Gait: not tested  ASSESSMENT/PLAN [name] is a 82 y.o. female with history of nonischemic cardiomyopathy, hyperlipidemia, hypertension, previous TIA, previous strokes, S/P bioprosthetic aortic valve replacement with ASD repair and CNS lymphoma presenting with weakness and hypotension. She did not receive IV t-PA due to late presentation.   Stroke:  Non-dominant small, acute right ACA territory infarct secondary to hypotension.  Resultant  Transient decrease in responsiveness  MRI - Small, acute right ACA territory infarct involving the cingulate gyrus as well as remote infarcts.  MRA - Not performed.  Carotid Doppler  - Will order.  2D Echo - EF 60-65%. No cardiac source of emboli identified.   LDL  - 102; will start statin  HgbA1c - 6.0  VTE prophylaxis - Lovenox Diet Heart Room service appropriate?: Yes; Fluid consistency:: Thin  No antithrombotic prior to admission, now on No antithrombotic Will order Aspirin 81 mg daily.  Patient counseled to be compliant with her antithrombotic medications  Ongoing aggressive stroke risk factor management  Therapy recommendations: - SNF recommended.  Disposition: pending  Hypertension  Stable  Permissive hypertension (OK if < 220/120) but gradually normalize in 5-7 days  Hyperlipidemia  Home meds: No lipid lowering medications prior to admission.  LDL 102, goal < 70  Now on Lipitor 20 mg daily.  Continue statin at discharge  Other Stroke Risk Factors  Advanced  age  Hx stroke/TIA  Family hx stroke (Father)  Other Active Problems  Renal insufficiency  Recent fall  Macrocytic anemia; on [facility] day # 2  Mikey Bussing PA-C [facility] Pager [phone] 02/28/2015, 8:40 AM  ATTENDING NOTE: Patient was seen and examined by me personally. Documentation reflects findings. The laboratory and radiographic studies reviewed by me. ROS completed by me personally and pertinent positives fully documented  Condition: Stable   Assessment and plan completed by me personally and fully documented above. Plans/Recommendations include:     Awaiting results of carotid studies  Continue ASA and  lipitor  Recommend further evaluation macrocytic anemia  Awaiting Social Work Consult for placement.   Mary-Jane Hewell the [place] [phone] [facility]) [phone] (cell). He will be traveling over the next few days.  Can be reached by cell  He stated that if we have trouble contacting him, please feel free to call his sister France Ravens [phone] [facility]).    Family wants info on [employer]  SIGNED BY: Dr. Elissa Hefty     To contact Stroke Continuity provider, please refer to [email or web address] After hours, contact General Neurology

## 2015-02-28 NOTE — Progress Notes (Signed)
[facility] PROGRESS NOTE  [name] [id number] DOB: 04/25/1919 DOA: 02/25/2015 PCP: Walker Kehr, MD  Assessment/Plan: Principal Problem:   Acute Stroke - Consulted Neurology - Patient undergoing further work up, currently awaiting carotid artery results - Will await final recommendations from neurology prior to discharge. 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 Will continue      Hyperlipidemia - continue statin  Code Status: DNR Family Communication: d/c patient and family Disposition Plan: Most likely SNF   Consultants:  Neurology  Procedures:  None  Antibiotics:  Rocephin  HPI/Subjective: Pt has no new complaints, no acute issues overnight.  Objective: Filed Vitals:   02/28/15 0637 02/28/15 0852  BP: 177/105 122/88  Pulse: 88 100  Temp: 97.6 F (36.4 C) 97.9 F (36.6 C)  Resp: 20 18    Intake/Output Summary (Last 24 hours) at 02/28/15 1316 Last data filed at 02/28/15 1104  Gross per 24 hour  Intake    660 ml  Output      0 ml  Net    660 ml   Filed Weights   02/26/15 0014 02/26/15 2120 02/28/15 0637  Weight: 56.836 kg (125 lb 4.8 oz) 57.153 kg (126 lb) 56.6 kg (124 lb 12.5 oz)    Exam:   General:  Pt in nad, alert and awake  Cardiovascular: rrr, no rubs  Respiratory: no increased wob, no wheezes  Abdomen: soft, nd, nt  Musculoskeletal: no cyanosis   Data Reviewed: Basic Metabolic Panel:  Recent Labs Lab 02/25/15 2103 02/26/15 0540  NA 137 137  K 4.5 4.2  CL 102 102  CO2 24 25  GLUCOSE 143* 112*  BUN 30* 27*  CREATININE 1.37* 1.12*  CALCIUM 9.0 9.0   Liver Function Tests:  Recent Labs Lab 02/25/15 2103  02/26/15 0540  AST 17 16  ALT 10* 11*  ALKPHOS 81 78  BILITOT 0.6 0.8  PROT 6.1* 5.8*  ALBUMIN 3.6 3.2*   No results for input(s): LIPASE, AMYLASE in the last 168 hours. No results for input(s): AMMONIA in the last 168 hours. CBC:  Recent Labs Lab 02/25/15 2103 02/26/15 0540  WBC 7.4 5.1  NEUTROABS 5.4 3.0  HGB 12.8 12.3  HCT 37.8 36.0  MCV 103.0* 100.6*  PLT 189 177   Cardiac Enzymes:  Recent Labs Lab 02/26/15 0540  TROPONINI 0.03   BNP (last 3 results) No results for input(s): BNP in the last 8760 hours.  ProBNP (last 3 results) No results for input(s): PROBNP in the last 8760 hours.  CBG:  Recent Labs Lab 02/26/15 0852 02/26/15 1630  GLUCAP 103* 98    No results found for this or any previous visit (from the past 240 hour(s)).   Studies: No results found.  Scheduled Meds: . aspirin EC  81 mg Oral Daily  . atorvastatin  20 mg Oral q1800  . cefTRIAXone (ROCEPHIN)  IV  1 g Intravenous Q24H  . cholecalciferol  1,000 Units Oral Daily  . enoxaparin (LOVENOX) injection  30 mg Subcutaneous Q24H  . folic acid  1 mg Oral Daily  . ipratropium  2 spray Nasal TID  . loratadine  10 mg Oral Daily  .  sodium chloride flush  3 mL Intravenous Q12H  . vitamin B-12  1,000 mcg Oral Daily   Continuous Infusions:    Time spent: > 35 minutes  Velvet Bathe  [facility] Pager [phone]. If 7PM-7AM, please contact night-coverage at [email or web address], password [facility] 02/28/2015, 1:16 PM  LOS: 2 days

## 2015-02-28 NOTE — Progress Notes (Signed)
VASCULAR LAB PRELIMINARY  PRELIMINARY  PRELIMINARY  PRELIMINARY  Carotid duplex  completed.    Preliminary report:  Bilateral:  1-39% ICA stenosis.  Vertebral artery flow is antegrade.      Makita Blow, RVT 02/28/2015, 3:55 PM

## 2015-03-01 ENCOUNTER — Inpatient Hospital Stay (HOSPITAL_COMMUNITY): Payer: Medicare Other

## 2015-03-01 ENCOUNTER — Encounter (HOSPITAL_COMMUNITY): Payer: Self-pay | Admitting: Radiology

## 2015-03-01 DIAGNOSIS — R55 Syncope and collapse: Secondary | ICD-10-CM

## 2015-03-01 MED ORDER — IOHEXOL 350 MG/ML SOLN
50.0000 mL | Freq: Once | INTRAVENOUS | Status: AC | PRN
  Administered 2015-03-01: 50 mL via INTRAVENOUS

## 2015-03-01 MED ORDER — CEPHALEXIN 250 MG PO CAPS
250.0000 mg | ORAL_CAPSULE | Freq: Two times a day (BID) | ORAL | Status: DC
  Administered 2015-03-01 – 2015-03-02 (×3): 250 mg via ORAL
  Filled 2015-03-01 (×4): qty 1

## 2015-03-01 NOTE — Clinical Social Work Note (Signed)
Clinical Social Work Assessment  Patient Details  Name: [name] MRN: [id number] Date of Birth: 12-Oct-1919  Date of referral:  02/28/15               Reason for consult:  Facility Placement                Permission sought to share information with:  Facility Sport and exercise psychologist, Family Supports Permission granted to share information::  Yes, Verbal Permission Granted  Name::        Agency::     Relationship::     Contact Information:     Housing/Transportation Living arrangements for the past 2 months:  Single Family Home Source of Information:  Patient, Friend/Neighbor Patient Interpreter Needed:  None Criminal Activity/Legal Involvement Pertinent to Current Situation/Hospitalization:  No - Comment as needed Significant Relationships:  Adult Children, Friend, Pets Lives with:   self Do you feel safe going back to the place where you live?  No Need for family participation in patient care:  No (Coment)  Care giving concerns:  CSW met with pt and her friend Dalene Seltzer) to discuss role of CSW/d/c planning. Pt lives alone and will need skilled level therapies in order to increase her independence and return home.  Pt is agreeable to SNF search in [facility].  Of note, pt's husband was at [facility] in the past.  Per pt, her son/daughter will be coming into town to assist with picking a rehab facility.  Social Worker assessment / plan: CSW will begin bed search in [facility] and facilitate d/c as appropriate. Employment status:  Retired Forensic scientist:  Medicare PT Recommendations:  [place] / Referral to community resources:  [place]  Patient/Family's Response to care: Agreeable to SNF search in [employer].  Pt anxious to get therapy so she can return home to her dog. Patient/Family's Understanding of and Emotional Response to Diagnosis, Current Treatment, and Prognosis:  Pt accepts and understands that she will  need rehab before returning home, as she lives alone and doesn't have 24 hr care.  Emotional Assessment Appearance:  Appears older than stated age Attitude/Demeanor/Rapport:   (cooperative) Affect (typically observed):  Calm Orientation:  Oriented to Self, Oriented to Place, Oriented to  Time, Oriented to Situation Alcohol / Substance use:  Not Applicable Psych involvement (Current and /or in the community):     Discharge Needs  Concerns to be addressed:  Discharge Planning Concerns Readmission within the last 30 days:  No Current discharge risk:  None Barriers to Discharge:  No Barriers Identified   Vallory Oetken, Miachel Roux, LCSW 03/01/2015, 7:46 PM

## 2015-03-01 NOTE — Progress Notes (Signed)
Physical Therapy Treatment Patient Details Name: [name] MRN: [id number] DOB: [birth year] Today's Date: 03/01/2015    History of Present Illness [name] is a 82 y.o. female history of CNS lymphoma status post surgery in remission, bioprosthetic aortic valve replacement and ASD repair, previous history of nonischemic cardiomyopathy, came due ED due to weakness and diaphoresis. MRI reveals acute right ACA territory infarct involving the cingulate.    PT Comments    Pt making great gains in acute PT goals. Pt still requiring Min A - Min Guard for OOB mobility however tolerates increased activity. Minimal LOB while ambulating today which pt could self-correct. Pt would benefit from functional balance training due to increased incidence of falling recently. Continuing to recommend ST-SNF upon D/C to maximize recovery towards Mod I for safe transition home independently.    Follow Up Recommendations  SNF;Supervision/Assistance - 24 hour     Equipment Recommendations  None recommended by PT    Recommendations for Other Services       Precautions / Restrictions Precautions Precautions: Fall Restrictions Weight Bearing Restrictions: No    Mobility  Bed Mobility Overal bed mobility: Needs Assistance Bed Mobility: Sit to Supine       Sit to supine: Min assist   General bed mobility comments: Min A to bring LEs onto bed and hand placement on bed rail to assist pt in scooting up in bed  Transfers Overall transfer level: Needs assistance Equipment used: Rolling walker (2 wheeled) Transfers: Sit to/from Stand Sit to Stand: Min assist         General transfer comment: Min A from chair using RW, multiple VCs for safety and hand placements  Ambulation/Gait Ambulation/Gait assistance: Min guard;Min assist Ambulation Distance (Feet): 125 Feet Assistive device: Rolling walker (2 wheeled) Gait Pattern/deviations: Step-through pattern;Decreased stride length;Leaning  posteriorly;Drifts right/left Gait velocity: slow Gait velocity interpretation: <1.8 ft/sec, indicative of risk for recurrent falls General Gait Details: Pt requiring Min G with RW, however 2-3 mild LOB posterior and to the left which pt was able to self correct, cues to keep close to [employer] Rankin (Stroke Patients Only) Modified Rankin (Stroke Patients Only) Pre-Morbid Rankin Score: Moderate disability Modified Rankin: Moderately severe disability     Balance Overall balance assessment: Needs assistance Sitting-balance support: Feet supported;No upper extremity supported Sitting balance-Leahy Scale: Fair   Postural control: Posterior lean Standing balance support: Bilateral upper extremity supported Standing balance-Leahy Scale: Fair                      Cognition Arousal/Alertness: Awake/alert Behavior During Therapy: WFL for tasks assessed/performed Overall Cognitive Status: Within Functional Limits for tasks assessed                      Exercises      General Comments        Pertinent Vitals/Pain Pain Assessment: No/denies pain    Home Living                      Prior Function            PT Goals (current goals can now be found in the care plan section) Acute Rehab PT Goals Patient Stated Goal: get out of the chair Progress towards PT goals: Progressing toward goals    Frequency  Min 3X/week  PT Plan Current plan remains appropriate;Frequency needs to be updated    Co-evaluation             End of Session Equipment Utilized During Treatment: Gait belt Activity Tolerance: Patient tolerated treatment well Patient left: in bed;with bed alarm set;with family/visitor present     Time: 1400-1417 PT Time Calculation (min) (ACUTE ONLY): 17 min  Charges:  $Gait Training: 8-22 mins                    G Codes:      Ara Kussmaul 17-Mar-2015, 3:26 PM  Ara Kussmaul, Student Physical Therapist Acute Rehab [phone]

## 2015-03-01 NOTE — Progress Notes (Signed)
STROKE TEAM PROGRESS NOTE   HISTORY AT THE TIME OF ADMISSION [name] is an 82 y.o. female history of CNS lymphoma status post surgery in remission, bioprosthetic aortic valve replacement and ASD repair, previous history of nonischemic cardiomyopathy improved last EF measured in September 2016 was 65-70%, was brought to the ER after patient was found to be weak. Patient was having dinner at a restaurant when patient was found to be weak and diaphoretic. As per the ER physician and nursing notes patient was hypotensive on arrival. Improved with fluids. Per friend, she was out eating dinner with a man friend and became "pale and diaphoretic. EMS was called and she was brought to ED. She had generalized weakness and improved with fluids." MRI was obtained and does show a right ACA stroke. ECG shows PAC but no Afib. OF note, she was recently diagnosed with UTI and placed on Cipro. Had only taken 2 pills.   Date last known well: 2.2.2017 Time last known well: Time: 20:00 tPA Given: No: out of window   SUBJECTIVE (INTERVAL HISTORY) Her family is not at the bedside.  Overall she feels her condition is improving. She denies complaints to full ROS   OBJECTIVE Temp:  [97.4 F (36.3 C)-98.1 F (36.7 C)] 97.4 F (36.3 C) (02/06 0818) Pulse Rate:  [83-89] 89 (02/06 0818) Cardiac Rhythm:  [-] Normal sinus rhythm (02/06 0705) Resp:  [17-20] 20 (02/06 0818) BP: (142-167)/(84-96) 142/92 mmHg (02/06 0818) SpO2:  [94 %-96 %] 95 % (02/06 0818)  CBC:   Recent Labs Lab 02/25/15 2103 02/26/15 0540  WBC 7.4 5.1  NEUTROABS 5.4 3.0  HGB 12.8 12.3  HCT 37.8 36.0  MCV 103.0* 100.6*  PLT 189 [id number]    Basic Metabolic Panel:   Recent Labs Lab 02/25/15 2103 02/26/15 0540  NA 137 137  K 4.5 4.2  CL 102 102  CO2 24 25  GLUCOSE 143* 112*  BUN 30* 27*  CREATININE 1.37* 1.12*  CALCIUM 9.0 9.0    Lipid Panel:     Component Value Date/Time   CHOL 163 02/27/2015 0536   TRIG 105 02/27/2015  0536   HDL 40* 02/27/2015 0536   CHOLHDL 4.1 02/27/2015 0536   VLDL 21 02/27/2015 0536   LDLCALC 102* 02/27/2015 0536   HgbA1c:  Lab Results  Component Value Date   HGBA1C 6.0* 02/26/2015   Urine Drug Screen:     Component Value Date/Time   LABOPIA NONE DETECTED 02/25/2015 2203   COCAINSCRNUR NONE DETECTED 02/25/2015 2203   LABBENZ NONE DETECTED 02/25/2015 2203   AMPHETMU NONE DETECTED 02/25/2015 2203   THCU NONE DETECTED 02/25/2015 2203   LABBARB NONE DETECTED 02/25/2015 2203      IMAGING  Dg Chest 2 View 02/25/2015   No active cardiopulmonary disease. Similar appearance of ascending aortic aneurysm.   Ct Head Wo Contrast 02/25/2015   1.  No acute intracranial abnormality.  2. Stable postsurgical and chronic change.   Mr Brain Wo Contrast 02/26/2015   1. Small, acute right ACA territory infarct involving the cingulate gyrus.  2. Moderate chronic small vessel ischemic disease, progressed from 2015 with additional chronic infarcts as above.   PHYSICAL EXAM HEENT- Normocephalic, no lesions, without obvious abnormality. Normal external eye and conjunctiva. Normal external nose, mucus membranes and septum. PERRL Cardiovascular- S1, S2 normal, pulses palpable throughout; loud harsh murmur c/w prosthetic valves  Lungs- no tachypnea, retractions or cyanosis Abdomen- normal findings: bowel sounds normal Extremities- no edema  Neurological Examination Mental  Status: Alert, oriented, thought content appropriate. Speech fluent without evidence of aphasia. Able to follow 3 step commands needing some help but able to follow simple commands without problems.   Cranial Nerves: II: Visual fields grossly normal, pupils equal, round, reactive to light and accommodation III,IV, VI: extra-ocular motions intact  V,VII: smile symmetric, facial light touch sensation normal bilaterally VIII: hearing grossly intact IX,X: uvula rises symmetrically XI: bilateral shoulder shrug XII:  midline tongue extension  Motor: Right :Upper extremity 5/5Left: Upper extremity 5/5 Lower extremity 5/5Lower extremity 5/5  Sensory: Light touch intact throughout, bilaterally  Cerebellar: normal finger-to-nose, and normal heel-to-shin test  Gait: not tested  ASSESSMENT/PLAN [name] is a 82 y.o. female with history of nonischemic cardiomyopathy, hyperlipidemia, hypertension, previous TIA, previous strokes, S/P bioprosthetic aortic valve replacement with ASD repair and CNS lymphoma presenting with weakness and hypotension. She did not receive IV t-PA due to late presentation.   Stroke:  Non-dominant small, acute right ACA territory infarct secondary to hypotension.  Resultant  Transient decrease in responsiveness  MRI - Small, acute right ACA territory infarct involving the cingulate gyrus as well as remote infarcts.  MRA - Not performed.  Carotid Doppler  - Will order.  2D Echo - EF 60-65%. No cardiac source of emboli identified.   LDL  - 102; will start statin  HgbA1c - 6.0  VTE prophylaxis - Lovenox Diet Heart Room service appropriate?: Yes; Fluid consistency:: Thin Diet NPO time specified  No antithrombotic prior to admission, now on No antithrombotic Will order Aspirin 81 mg daily.  Patient counseled to be compliant with her antithrombotic medications  Ongoing aggressive stroke risk factor management  Therapy recommendations: - SNF recommended.  Disposition: pending  Hypertension  Stable  Permissive hypertension (OK if < 220/120) but gradually normalize in 5-7 days  Hyperlipidemia  Home meds: No lipid lowering medications prior to admission.  LDL 102, goal < 70  Now on Lipitor 20 mg daily.  Continue statin at discharge  Other Stroke Risk Factors  Advanced age  Hx stroke/TIA  Family hx stroke (Father)  Other Active  Problems  Renal insufficiency  Recent fall  Macrocytic anemia; on [facility] day # 3          Patient was seen and examined by me personally. Documentation reflects findings. The laboratory and radiographic studies reviewed by me. ROS completed by me personally and pertinent positives fully documented     Assessment and plan completed by me personally and fully documented above. Plans/Recommendations include:  Patient remains at risk for recurrent stroke, TIA and needs ongoing stroke evaluation including TEE and loop recorder. I had a long discussion with the patient's son at the bedside regarding her stroke which is likely embolic. Patient seems quite functional at baseline and she is in remission from her CNS lymphoma and has a overall good quality of life hence we will be aggressive with her stroke workup Antony Contras, MD    To contact Stroke Continuity provider, please refer to [email or web address] After hours, contact General Neurology

## 2015-03-01 NOTE — Care Management Important Message (Signed)
Important Message  Patient Details  Name: [name] MRN: [id number] Date of Birth: 13-Oct-1919   Medicare Important Message Given:  Yes    Louanne Belton 03/01/2015, 11:15 AMImportant Message  Patient Details  Name: [name] MRN: [id number] Date of Birth: 1919/11/09   Medicare Important Message Given:  Yes    Tiona Ruane G 03/01/2015, 11:15 AM

## 2015-03-01 NOTE — Progress Notes (Signed)
[facility] PROGRESS NOTE  Darica Delillo Lagunes [id number] DOB: March 17, 1919 DOA: 02/25/2015 PCP: Walker Kehr, MD  Assessment/Plan: Principal Problem:   Acute Stroke - Consulted Neurology - Patient undergoing further work up, once cleared for d/c by neurology will plan on setting up discharge planning - currently on aspirin and lipitor  Active Problems:   Essential hypertension - allow for permissive htn at this time    Chronic diastolic heart failure ([place]) - compensated currently    S/P AVR   EBV (-) primary lymphoma of brain ([place]) -in lieu of principle problem and     UTI (urinary tract infection) - stable on rocephin. Will continue      Hyperlipidemia - continue statin  Code Status: DNR Family Communication: d/c patient and family Disposition Plan: Most likely SNF   Consultants:  Neurology  Procedures:  Please refer to neuro notes  Antibiotics:  Rocephin  HPI/Subjective: Pt still has no new complaints, no acute issues overnight.  Objective: Filed Vitals:   03/01/15 0525 03/01/15 0818  BP: 146/84 142/92  Pulse: 83 89  Temp: 98.1 F (36.7 C) 97.4 F (36.3 C)  Resp: 20 20    Intake/Output Summary (Last 24 hours) at 03/01/15 1516 Last data filed at 03/01/15 0909  Gross per 24 hour  Intake    360 ml  Output      0 ml  Net    360 ml   Filed Weights   02/26/15 0014 02/26/15 2120 02/28/15 0637  Weight: 56.836 kg (125 lb 4.8 oz) 57.153 kg (126 lb) 56.6 kg (124 lb 12.5 oz)    Exam:   General:  Pt in nad, alert and awake  Cardiovascular: rrr, no rubs  Respiratory: no increased wob, no wheezes  Abdomen: soft, nd, nt  Musculoskeletal: no cyanosis   Data Reviewed: Basic Metabolic Panel:  Recent Labs Lab 02/25/15 2103 02/26/15 0540  NA 137 137  K 4.5 4.2  CL 102 102  CO2 24 25  GLUCOSE 143* 112*  BUN 30* 27*  CREATININE 1.37* 1.12*  CALCIUM 9.0 9.0   Liver Function Tests:  Recent Labs Lab 02/25/15 2103 02/26/15 0540   AST 17 16  ALT 10* 11*  ALKPHOS 81 78  BILITOT 0.6 0.8  PROT 6.1* 5.8*  ALBUMIN 3.6 3.2*   No results for input(s): LIPASE, AMYLASE in the last 168 hours. No results for input(s): AMMONIA in the last 168 hours. CBC:  Recent Labs Lab 02/25/15 2103 02/26/15 0540  WBC 7.4 5.1  NEUTROABS 5.4 3.0  HGB 12.8 12.3  HCT 37.8 36.0  MCV 103.0* 100.6*  PLT 189 177   Cardiac Enzymes:  Recent Labs Lab 02/26/15 0540  TROPONINI 0.03   BNP (last 3 results) No results for input(s): BNP in the last 8760 hours.  ProBNP (last 3 results) No results for input(s): PROBNP in the last 8760 hours.  CBG:  Recent Labs Lab 02/26/15 0852 02/26/15 1630  GLUCAP 103* 98    No results found for this or any previous visit (from the past 240 hour(s)).   Studies: No results found.  Scheduled Meds: . aspirin EC  81 mg Oral Daily  . atorvastatin  20 mg Oral q1800  . cephALEXin  250 mg Oral Q12H  . cholecalciferol  1,000 Units Oral Daily  . enoxaparin (LOVENOX) injection  30 mg Subcutaneous Q24H  . folic acid  1 mg Oral Daily  . ipratropium  2 spray Nasal TID  . loratadine  10 mg Oral Daily  .  sodium chloride flush  3 mL Intravenous Q12H  . vitamin B-12  1,000 mcg Oral Daily   Continuous Infusions:    Time spent: > 35 minutes  Velvet Bathe  [facility] Pager [phone]. If 7PM-7AM, please contact night-coverage at [email or web address], password [facility] 03/01/2015, 3:16 PM  LOS: 3 days

## 2015-03-01 NOTE — Evaluation (Signed)
Clinical/Bedside Swallow Evaluation Patient Details  Name: [name] MRN: [id number] Date of Birth: December 04, 1919  Today's Date: 03/01/2015 Time: SLP Start Time (ACUTE ONLY): 1324 SLP Stop Time (ACUTE ONLY): 1348 SLP Time Calculation (min) (ACUTE ONLY): 24 min  Past Medical History:  Past Medical History  Diagnosis Date  . Nonischemic cardiomyopathy [facility]) 2007 / Feb 2009    Miminial non-obs CAD cath (2007); EF previously 25%;  55% by echo 2012;  Echo 4/14:  mild LVH; mod asymmetric hypertrophy of the septum, EF 55-60%, Gr 1 DD, AVR ok (mean gradient 9), MAC, mild LAE, PASP 31  . Aortic insufficiency October 2007    Severe with ascending thoracic aneurysm; s/p pericardial tissue AVR and Heamsheild graft in October 2007; c/b pericardial effusion requiring window;  echo 03/2010: inferobasal HK, severe LVH, EF 55-60%, dynamic obstruction during Valsalva in mid cavity, peak velocity 232 cm/sec, peak gradient 22 mmHg, AVR ok, mod dilated aorta, mild LAE  . Hyperlipidemia   . Hypertension   . GERD (gastroesophageal reflux disease)   . History of cholelithiasis     Gallbladder dysfunction  . History of non anemic vitamin B12 deficiency   . Allergic rhinitis   . TIA (transient ischemic attack) October 2010  . Brain cancer ([place])   . CNS lymphoma ([place])   . History of recent fall 09/2013+    > 2 falls year   Past Surgical History:  Past Surgical History  Procedure Laterality Date  . Tonsillectomy    . Aortic valve replacement  2007  . Bilateral vein stripping    . Left temporal tumor resected      B cell lymphoma   HPI:  [name] is a 82 y.o. female history of CNS lymphoma status post surgery in remission, bioprosthetic aortic valve replacement and ASD repair, previous history of nonischemic cardiomyopathy, came due ED due to weakness and diaphoresis.MRI- Small, acute right ACA territory infarct involving the cingulate gyrus.  Pt passed RN stroke swallow screen upon admission, but  noted to be coughing with meal today, so SLP swallow evaluation was ordered.     Assessment / Plan / Recommendation Clinical Impression  Pt presents with mild s/s of dysphagia with potential for aspiration when consuming large, consecutive boluses of thin liquid.  There are no focal CN deficits; mastication is adequate; thin liquids elicit intermittent cough when consumed in quick succession.  Pt asserts difficulty swallowing "for a while," but description is vague, and may related more to esophageal than pharyngeal function.  Recommend resuming a regular diet with thin liquids for now; f/u next date after TEE for MBS for more definitive assessment of swallow.  Pt and her son agree with plan.      Aspiration Risk  Mild aspiration risk ;    Diet Recommendation   resume regular dit, thin liquids pending MBS next date.  Medication Administration: Whole meds with puree    Other  Recommendations Oral Care Recommendations: Oral care BID   Follow up Recommendations   (tba)    Frequency and Duration            Prognosis Prognosis for Safe Diet Advancement: Good      Swallow Study   General Date of Onset: 02/25/15 HPI: [name] is a 82 y.o. female history of CNS lymphoma status post surgery in remission, bioprosthetic aortic valve replacement and ASD repair, previous history of nonischemic cardiomyopathy, came due ED due to weakness and diaphoresis.MRI- Small, acute right ACA territory  infarct involving the cingulate gyrus.  Pt passed RN stroke swallow screen upon admission, but noted to be coughing with meal today, so SLP swallow evaluation was ordered.   Type of Study: Bedside Swallow Evaluation Previous Swallow Assessment: none per records Diet Prior to this Study: NPO Temperature Spikes Noted: No Respiratory Status: Room air History of Recent Intubation: No Behavior/Cognition: Alert;Cooperative;Pleasant mood Oral Cavity Assessment: Within Functional Limits Oral Care Completed  by SLP: No Oral Cavity - Dentition: Adequate natural dentition Vision: Functional for self-feeding Self-Feeding Abilities: Able to feed self Patient Positioning: Upright in chair Baseline Vocal Quality: Wet Volitional Cough: Strong Volitional Swallow: Able to elicit    Oral/Motor/Sensory Function Overall Oral Motor/Sensory Function: Within functional limits   Ice Chips Ice chips: Within functional limits   Thin Liquid Thin Liquid: Impaired Presentation: Cup;Straw Pharyngeal  Phase Impairments: Cough - Immediate    Nectar Thick Nectar Thick Liquid: Not tested   Honey Thick Honey Thick Liquid: Not tested   Puree Puree: Within functional limits Presentation: Self Fed;Spoon   Solid   GO   Solid: Within functional limits Presentation: Self Fed        [name] 03/01/2015,1:56 PM

## 2015-03-02 ENCOUNTER — Inpatient Hospital Stay (HOSPITAL_COMMUNITY): Payer: Medicare Other

## 2015-03-02 DIAGNOSIS — F419 Anxiety disorder, unspecified: Secondary | ICD-10-CM | POA: Diagnosis not present

## 2015-03-02 DIAGNOSIS — R05 Cough: Secondary | ICD-10-CM | POA: Diagnosis not present

## 2015-03-02 DIAGNOSIS — R262 Difficulty in walking, not elsewhere classified: Secondary | ICD-10-CM | POA: Diagnosis not present

## 2015-03-02 DIAGNOSIS — F329 Major depressive disorder, single episode, unspecified: Secondary | ICD-10-CM | POA: Diagnosis not present

## 2015-03-02 DIAGNOSIS — R413 Other amnesia: Secondary | ICD-10-CM | POA: Diagnosis not present

## 2015-03-02 DIAGNOSIS — F0280 Dementia in other diseases classified elsewhere without behavioral disturbance: Secondary | ICD-10-CM | POA: Diagnosis not present

## 2015-03-02 DIAGNOSIS — R55 Syncope and collapse: Secondary | ICD-10-CM | POA: Diagnosis not present

## 2015-03-02 DIAGNOSIS — I255 Ischemic cardiomyopathy: Secondary | ICD-10-CM | POA: Diagnosis not present

## 2015-03-02 DIAGNOSIS — Z5189 Encounter for other specified aftercare: Secondary | ICD-10-CM | POA: Diagnosis not present

## 2015-03-02 DIAGNOSIS — N289 Disorder of kidney and ureter, unspecified: Secondary | ICD-10-CM | POA: Diagnosis not present

## 2015-03-02 DIAGNOSIS — R2681 Unsteadiness on feet: Secondary | ICD-10-CM | POA: Diagnosis not present

## 2015-03-02 DIAGNOSIS — N390 Urinary tract infection, site not specified: Secondary | ICD-10-CM | POA: Diagnosis not present

## 2015-03-02 DIAGNOSIS — I639 Cerebral infarction, unspecified: Secondary | ICD-10-CM

## 2015-03-02 DIAGNOSIS — I10 Essential (primary) hypertension: Secondary | ICD-10-CM | POA: Diagnosis not present

## 2015-03-02 DIAGNOSIS — M6281 Muscle weakness (generalized): Secondary | ICD-10-CM | POA: Diagnosis not present

## 2015-03-02 DIAGNOSIS — R0600 Dyspnea, unspecified: Secondary | ICD-10-CM | POA: Diagnosis not present

## 2015-03-02 DIAGNOSIS — R5381 Other malaise: Secondary | ICD-10-CM | POA: Diagnosis not present

## 2015-03-02 DIAGNOSIS — R489 Unspecified symbolic dysfunctions: Secondary | ICD-10-CM | POA: Diagnosis not present

## 2015-03-02 DIAGNOSIS — E46 Unspecified protein-calorie malnutrition: Secondary | ICD-10-CM | POA: Diagnosis not present

## 2015-03-02 DIAGNOSIS — E785 Hyperlipidemia, unspecified: Secondary | ICD-10-CM | POA: Diagnosis not present

## 2015-03-02 DIAGNOSIS — Z954 Presence of other heart-valve replacement: Secondary | ICD-10-CM | POA: Diagnosis not present

## 2015-03-02 DIAGNOSIS — C8589 Other specified types of non-Hodgkin lymphoma, extranodal and solid organ sites: Secondary | ICD-10-CM | POA: Diagnosis not present

## 2015-03-02 DIAGNOSIS — Z9181 History of falling: Secondary | ICD-10-CM | POA: Diagnosis not present

## 2015-03-02 DIAGNOSIS — R278 Other lack of coordination: Secondary | ICD-10-CM | POA: Diagnosis not present

## 2015-03-02 DIAGNOSIS — R260 Ataxic gait: Secondary | ICD-10-CM | POA: Diagnosis not present

## 2015-03-02 DIAGNOSIS — R279 Unspecified lack of coordination: Secondary | ICD-10-CM | POA: Diagnosis not present

## 2015-03-02 DIAGNOSIS — G3183 Dementia with Lewy bodies: Secondary | ICD-10-CM | POA: Diagnosis not present

## 2015-03-02 DIAGNOSIS — R1312 Dysphagia, oropharyngeal phase: Secondary | ICD-10-CM | POA: Diagnosis not present

## 2015-03-02 DIAGNOSIS — N189 Chronic kidney disease, unspecified: Secondary | ICD-10-CM | POA: Diagnosis not present

## 2015-03-02 DIAGNOSIS — F4321 Adjustment disorder with depressed mood: Secondary | ICD-10-CM | POA: Diagnosis not present

## 2015-03-02 MED ORDER — ATORVASTATIN CALCIUM 20 MG PO TABS: 20.0000 mg | ORAL_TABLET | Freq: Every day | ORAL | Status: DC

## 2015-03-02 MED ORDER — CEPHALEXIN 250 MG PO CAPS: 250.0000 mg | ORAL_CAPSULE | Freq: Two times a day (BID) | ORAL | Status: DC

## 2015-03-02 MED ORDER — GUAIFENESIN ER 600 MG PO TB12
600.0000 mg | ORAL_TABLET | Freq: Two times a day (BID) | ORAL | Status: DC
  Administered 2015-03-02: 600 mg via ORAL
  Filled 2015-03-02: qty 1

## 2015-03-02 MED ORDER — ASPIRIN 81 MG PO TBEC: 81.0000 mg | DELAYED_RELEASE_TABLET | Freq: Every day | ORAL | Status: AC

## 2015-03-02 NOTE — Clinical Social Work Note (Signed)
Patient was discharged to [facility]. The patient's son is transporting the patient to the skilled nursing facility. There were no further questions or concerns.

## 2015-03-02 NOTE — Progress Notes (Signed)
[name] to be D/C'd Nursing Home per MD order. Report given to charge nurse at [facility]. AVS printed for pt's son.     Medication List    STOP taking these medications        ciprofloxacin 250 MG tablet  Commonly known as:  CIPRO     furosemide 20 MG tablet  Commonly known as:  LASIX     ibuprofen 200 MG tablet  Commonly known as:  ADVIL,MOTRIN     loratadine 10 MG tablet  Commonly known as:  CLARITIN      TAKE these medications        aspirin 81 MG EC tablet  Take 1 tablet (81 mg total) by mouth daily.     atorvastatin 20 MG tablet  Commonly known as:  LIPITOR  Take 1 tablet (20 mg total) by mouth daily at 6 PM.     cephALEXin 250 MG capsule  Commonly known as:  KEFLEX  Take 1 capsule (250 mg total) by mouth every 12 (twelve) hours.     cholecalciferol 1000 units tablet  Commonly known as:  VITAMIN D  Take 1 tablet (1,000 Units total) by mouth daily.     folic acid 1 MG tablet  Commonly known as:  FOLVITE  Take 1 tablet (1 mg total) by mouth daily.     guaiFENesin 600 MG 12 hr tablet  Commonly known as:  MUCINEX  Take 600 mg by mouth 2 (two) times daily as needed for cough.     ipratropium 0.06 % nasal spray  Commonly known as:  ATROVENT  Place 2 sprays into the nose 3 (three) times daily.     ramipril 2.5 MG capsule  Commonly known as:  ALTACE  Take 1 capsule (2.5 mg total) by mouth daily.     Vitamin B-12 1000 MCG Subl  Place 1 tablet (1,000 mcg total) under the tongue daily.        Filed Vitals:   03/01/15 2206 03/02/15 0517  BP: 180/101 160/95  Pulse: 92 90  Temp: 97.9 F (36.6 C) 97.6 F (36.4 C)  Resp: 18 16    Skin clean, dry and intact without evidence of skin break down, no evidence of skin tears noted. IV catheter discontinued intact. Site without signs and symptoms of complications. Dressing and pressure applied. Pt denies pain at this time. No complaints noted.  An After Visit Summary was printed and given to the  patient. Patient escorted via [place], and D/C to [facility] by son via private auto.  Carole Civil RN [facility] Phone [phone]

## 2015-03-02 NOTE — Consult Note (Signed)
ELECTROPHYSIOLOGY CONSULT NOTE  Patient ID: [name] CID MRN: [id number], DOB/AGE: 80-Jan-1921   Admit date: 02/25/2015 Date of Consult: 03/02/2015  Primary Physician: Walker Kehr, MD Primary Cardiologist: Hoch[place] Reason for Consultation: Cryptogenic stroke; recommendations regarding Implantable Loop Recorder  History of Present Illness [name] was admitted on 02/25/2015 with acute weakness that began while eating dinner on the day of admission.  She was also diaphoretic.  She was brought to the ER for further evaluation and was found to be hypotensive.  Imaging demonstrated right ACA stroke.  She has undergone workup for stroke including echocardiogram and carotid dopplers.  The patient has been monitored on telemetry which has demonstrated sinus rhythm with frequent PAC's, PVC's, short runs of atrial tach.  Inpatient stroke work-up is to be completed with a TEE.   Echocardiogram this admission demonstrated EF 123456, grade 1 diastolic dysfunction, bioprosthetic aortic valve, LA 35.   Prior to admission, the patient denies chest pain, shortness of breath, dizziness, palpitations, or syncope.  They are recovering from their stroke with plans to go to SNF at discharge.  EP has been asked to evaluate for placement of an implantable loop recorder to monitor for atrial fibrillation.   Past Medical History  Diagnosis Date  . Nonischemic cardiomyopathy [facility]) 2007 / Feb 2009    Miminial non-obs CAD cath (2007); EF previously 25%;  55% by echo 2012;  Echo 4/14:  mild LVH; mod asymmetric hypertrophy of the septum, EF 55-60%, Gr 1 DD, AVR ok (mean gradient 9), MAC, mild LAE, PASP 31  . Aortic insufficiency October 2007    Severe with ascending thoracic aneurysm; s/p pericardial tissue AVR and Heamsheild graft in October 2007; c/b pericardial effusion requiring window;  echo 03/2010: inferobasal HK, severe LVH, EF 55-60%, dynamic obstruction during Valsalva in mid cavity, peak velocity  232 cm/sec, peak gradient 22 mmHg, AVR ok, mod dilated aorta, mild LAE  . Hyperlipidemia   . Hypertension   . GERD (gastroesophageal reflux disease)   . History of cholelithiasis     Gallbladder dysfunction  . History of non anemic vitamin B12 deficiency   . Allergic rhinitis   . TIA (transient ischemic attack) October 2010  . Brain cancer ([place])   . CNS lymphoma ([place])   . History of recent fall 09/2013+    > 2 falls year     Surgical History:  Past Surgical History  Procedure Laterality Date  . Tonsillectomy    . Aortic valve replacement  2007  . Bilateral vein stripping    . Left temporal tumor resected      B cell lymphoma     Prescriptions prior to admission  Medication Sig Dispense Refill Last Dose  . ciprofloxacin (CIPRO) 250 MG tablet Take 1 tablet (250 mg total) by mouth 2 (two) times daily. (Patient taking differently: Take 250 mg by mouth 2 (two) times daily. 5 day course picked up 02/25/15 am) 10 tablet 0 02/25/2015 at unknown  . Cyanocobalamin (VITAMIN B-12) 1000 MCG SUBL Place 1 tablet (1,000 mcg total) under the tongue daily. 100 tablet 3 maybe 2/2  . folic acid (FOLVITE) 1 MG tablet Take 1 tablet (1 mg total) by mouth daily. 90 tablet 3 02/25/2015 at Unknown time  . guaiFENesin (MUCINEX) 600 MG 12 hr tablet Take 600 mg by mouth 2 (two) times daily as needed for cough.   unknown  . ibuprofen (ADVIL,MOTRIN) 200 MG tablet Take 200 mg by mouth every 6 (six) hours as needed (  pain).   unknown  . loratadine (CLARITIN) 10 MG tablet Take 1 tablet (10 mg total) by mouth daily.   maybe 2/2  . ramipril (ALTACE) 2.5 MG capsule Take 1 capsule (2.5 mg total) by mouth daily. 90 capsule 2 02/25/2015 at Unknown time  . cholecalciferol (VITAMIN D) 1000 UNITS tablet Take 1 tablet (1,000 Units total) by mouth daily. (Patient not taking: Reported on 02/26/2015) 100 tablet 3 Not Taking at Unknown time  . furosemide (LASIX) 20 MG tablet Take 1 tablet (20 mg total) by mouth daily as needed. (Patient not  taking: Reported on 02/26/2015) 30 tablet 6 Not Taking at Unknown time  . ipratropium (ATROVENT) 0.06 % nasal spray Place 2 sprays into the nose 3 (three) times daily. (Patient not taking: Reported on 01/08/2015) 15 mL 3 Not Taking at Unknown time    Inpatient Medications:  . aspirin EC  81 mg Oral Daily  . atorvastatin  20 mg Oral q1800  . cephALEXin  250 mg Oral Q12H  . cholecalciferol  1,000 Units Oral Daily  . enoxaparin (LOVENOX) injection  30 mg Subcutaneous Q24H  . folic acid  1 mg Oral Daily  . ipratropium  2 spray Nasal TID  . loratadine  10 mg Oral Daily  . sodium chloride flush  3 mL Intravenous Q12H  . vitamin B-12  1,000 mcg Oral Daily    Allergies:  Allergies  Allergen Reactions  . Aricept [Donepezil Hcl] Nausea And Vomiting    Social History   Social History  . Marital Status: Widowed    Spouse Name: N/A  . Number of Children: N/A  . Years of Education: N/A   Occupational History  . Not on file.   Social History Main Topics  . Smoking status: Never Smoker   . Smokeless tobacco: Never Used  . Alcohol Use: No  . Drug Use: No  . Sexual Activity: Not on file   Other Topics Concern  . Not on file   Social History Narrative   HSG   Married @ 1942, 77 years/ widowed 2007   Lives alone in [place]   She was a housewife   No h/o abuse   End-of-Life: No CPR; DNI; no prolonged tube feeding.  Provided signed "Out of Facility Order" and completed and signed MOST form.  These should accompany her to hospital, etc.  She should share her wished with the family (04/01/10)           Family History  Problem Relation Age of Onset  . Stroke Father   . Arthritis Other   . Coronary artery disease Other     Female 1st degree relative <50  . Diabetes Other   . Cerebral aneurysm Son      Review of Systems: All other systems reviewed and are otherwise negative except as noted above.  Physical Exam: Filed Vitals:   03/01/15 0525 03/01/15 0818 03/01/15 2206  03/02/15 0517  BP: 146/84 142/92 180/101 160/95  Pulse: 83 89 92 90  Temp: 98.1 F (36.7 C) 97.4 F (36.3 C) 97.9 F (36.6 C) 97.6 F (36.4 C)  TempSrc: Oral Oral Oral Oral  Resp: 20 20 18 16   Height:      Weight:    125 lb 6.4 oz (56.881 kg)  SpO2: 94% 95% 99% 97%    GEN- The patient is elderly and frail appearing, confused  Head- normocephalic, atraumatic Eyes-  Sclera clear, conjunctiva pink Ears- hearing intact Oropharynx- clear Neck- supple Lungs- Clear to  ausculation bilaterally, normal work of breathing Heart- Regular rate and rhythm  GI- soft, NT, ND, + BS Extremities- no clubbing, cyanosis, or edema MS- no significant deformity or atrophy Skin- no rash or lesion Psych- euthymic mood, full affect   Labs:   Lab Results  Component Value Date   WBC 5.1 02/26/2015   HGB 12.3 02/26/2015   HCT 36.0 02/26/2015   MCV 100.6* 02/26/2015   PLT 177 02/26/2015    Recent Labs Lab 02/26/15 0540  NA 137  K 4.2  CL 102  CO2 25  BUN 27*  CREATININE 1.12*  CALCIUM 9.0  PROT 5.8*  BILITOT 0.8  ALKPHOS 78  ALT 11*  AST 16  GLUCOSE 112*     Radiology/Studies: Ct Angio Head W/cm &/or Wo Cm 03/01/2015  CLINICAL DATA:  Stroke. Weakness and dizziness. Acute stroke in the right cingulate gyrus. EXAM: CT ANGIOGRAPHY HEAD TECHNIQUE: Multidetector CT imaging of the head was performed using the standard protocol during bolus administration of intravenous contrast. Multiplanar CT image reconstructions and MIPs were obtained to evaluate the vascular anatomy. CONTRAST:  35mL OMNIPAQUE IOHEXOL 350 MG/ML SOLN COMPARISON:  MRI 02/26/2015 FINDINGS: CT HEAD Brainstem appears unremarkable. There is an old left cerebellar infarction. Cerebral hemispheres show old infarction of the left temporal tip, old infarction in the right thalamus, and extensive chronic small vessel ischemic changes throughout the white matter. The acute cingulate gyrus infarction on the right cannot be specifically  resolved. Colloid cysts measuring 7 mm is unchanged. Ventricular size is stable. No extra-axial collection. No blood products. CTA HEAD Anterior circulation: Both internal carotid arteries show aneurysmal dilatation beneath the skullbase, transverse diameter 7 mm on the left and the right. Both vessels are widely patent through the skullbase. There is atherosclerotic calcification in the siphon regions without stenosis estimated at no more than about 30%. Supra clinoid internal carotid artery shows circumferential calcification with stenosis estimated at 30%. Immediately beyond that, there is aneurysmal dilatation of the supra clinoid ICA on the left with diameter of 7 mm. Supra clinoid ICA on the right is ectatic with maximal diameter of 5 mm. The anterior and middle cerebral vessels are patent proximally. There is coarse calcification in the right M1 and M2 region. No stenosis greater than 30% is present. Right pericallosal artery is attenuated and occluded at about the level of the anterior body of the corpus callosum. Distal branch vessels do show diffuse atherosclerotic irregularity throughout. Posterior circulation: Both vertebral arteries are widely patent to the basilar. The basilar shows atherosclerotic irregularity but no stenosis. Mild aneurysmal dilatation of the proximal basilar with a diameter up to 6 mm. Posterior circulation branch vessels are patent. Left PCA receives most of it supply from the anterior circulation. 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 Advanced atherosclerotic narrowing and irregularity of the distal branch vessels diffusely. No correctable proximal stenosis or proximal occlusion. Aneurysmal dilatation of the internal carotid arteries beneath the skullbase with transverse  diameter of 7 mm. Aneurysmal dilatation of the supra clinoid internal carotid arteries bilaterally, 7 mm on the left at 5 mm on the right. Aneurysmal dilatation of the proximal vertebral artery measuring 6 mm. Electronically Signed   By: Nelson Chimes M.D.   On: 03/01/2015 18:27   Dg Chest 2 View 02/25/2015  CLINICAL DATA:  Eating dinner and became  weak. EXAM: CHEST  2 VIEW COMPARISON:  03/19/2014 FINDINGS: Previous median sternotomy and CABG procedure. Aortic atherosclerosis noted. Aneurysmal dilatation of the ascending thoracic aorta is again noted and appears similar in size to 2/20 2/16. No pleural effusion identified. No airspace consolidation or atelectasis. Degenerative disc disease is noted throughout the thoracic spine. IMPRESSION: No active cardiopulmonary disease. Similar appearance of ascending aortic aneurysm. Electronically Signed   By: Kerby Moors M.D.   On: 02/25/2015 21:32   12-lead ECG sinus rhythm, PAC's, rate 92 All prior EKG's in EPIC reviewed with no documented atrial fibrillation  Telemetry sinus rhythm, frequent atrial and ventricular ectopy, runs of AT  Assessment and Plan:  1. Cryptogenic stroke The patient presents with cryptogenic stroke.  The patient has a TEE planned for this today. She is confused and has frequent falls according to her son.  Her son is unsure that he would agree for her to be on [place] term.  She has follow up scheduled with Dr Percival Spanish who knows her well.  If she is felt to be a long term candidate for anticoagulation, we can discuss ILR at that time.   Please call with questions.   Patsey Berthold, NP 03/02/2015 10:47 AM  I have seen, examined the patient, and reviewed the above assessment and plan.  On exam, she is elderly and frail.  Changes to above are made where necessary.  I had a long discussion with the patient and her son.  They would prefer a conservative approach.  As she is confused and has frequent falls, her son does not feel that  she would be a candidate for long term anticoagulation.  After careful thought, I would not recommend 30 monitor or an implantable loop recorder for this patient.  She will follow-up with Dr Percival Spanish later this month as scheduled  Co Sign: Thompson Grayer, MD 03/02/2015 11:04 AM

## 2015-03-02 NOTE — Progress Notes (Signed)
SLP Cancellation Note  Patient Details Name: [name] MRN: [id number] DOB: 1919-07-31   Cancelled treatment:         Pt NPO for TEE scheduled at 1400 this pm. Unable to perform MBSS, will schedule for next date.   Titus Mould 03/02/2015, 9:30 AM

## 2015-03-02 NOTE — Discharge Summary (Signed)
Physician Discharge Summary  [name] [id number] DOB: February 25, 1919 DOA: 02/25/2015  PCP: Walker Kehr, MD  Admit date: 02/25/2015 Discharge date: 03/02/2015  Time spent: > 35 minutes  Recommendations for Outpatient Follow-up:  1. Please ensure patient follow-up with cardiology and neurology after discharge 2. 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)   UTI (urinary tract infection)   UTI (lower urinary tract infection)   Acute CVA (cerebrovascular accident) ([place])   Hyperlipidemia   Discharge Condition: stable  Diet recommendation: heart healthy  Filed Weights   02/26/15 2120 02/28/15 0637 03/02/15 0517  Weight: 57.153 kg (126 lb) 56.6 kg (124 lb 12.5 oz) 56.881 kg (125 lb 6.4 oz)    History of present illness:  82 year old with history of CNS lymphoma status post operation in remission, bioprosthetic aortic valve replacement and ASD repair, previous history of nonischemic cardiomyopathy, who presented with complaints of weakness and was found to have an acute CVA.   Hospital Course:  Acute CVA - Neurology consulted and patient underwent work up. Cardiology did not recommend anticoagulation on this patient as such TEE was not performed. Discuss with son who is in agreement given that patient has history of frequent falls - At this moment continue baby aspirin and Lipitor  Otherwise for no medical problems listed above will continue medication regimen listed below  Procedures: Dg Chest 2 View 02/25/2015  No active cardiopulmonary disease. Similar appearance of ascending aortic aneurysm.   Ct Head Wo Contrast 02/25/2015  1. No acute intracranial abnormality.  2. Stable postsurgical and chronic change.   Mr Brain Wo Contrast 02/26/2015  1. Small, acute right ACA  territory infarct involving the cingulate gyrus.  2. Moderate chronic small vessel ischemic disease, progressed from 2015 with additional chronic infarcts as above.   Consultations:  Neurology  Cardiology: Dr. Rayann Heman  Discharge Exam: Filed Vitals:   03/01/15 2206 03/02/15 0517  BP: 180/101 160/95  Pulse: 92 90  Temp: 97.9 F (36.6 C) 97.6 F (36.4 C)  Resp: 18 16    General: Pt in nad, alert and awake Cardiovascular: rrr, no mrg Respiratory: cta bl, no wheezes  Discharge Instructions   Discharge Instructions    Call MD for:  redness, tenderness, or signs of infection (pain, swelling, redness, odor or green/yellow discharge around incision site)    Complete by:  As directed      Call MD for:  temperature >100.4    Complete by:  As directed      Diet - low sodium heart healthy    Complete by:  As directed      Discharge instructions    Complete by:  As directed   Please follow-up with cardiology within the next 2 weeks     Increase activity slowly    Complete by:  As directed           Current Discharge Medication List    START taking these medications   Details  aspirin EC 81 MG EC tablet Take 1 tablet (81 mg total) by mouth daily. Qty: 30 tablet, Refills: 0    atorvastatin (LIPITOR) 20 MG tablet Take 1 tablet (20 mg total) by mouth daily at 6 PM. Qty: 30 tablet, Refills: 0    cephALEXin (KEFLEX) 250 MG capsule Take 1 capsule (250  mg total) by mouth every 12 (twelve) hours. Qty: 4 capsule, Refills: 0      CONTINUE these medications which have NOT CHANGED   Details  Cyanocobalamin (VITAMIN B-12) 1000 MCG SUBL Place 1 tablet (1,000 mcg total) under the tongue daily. Qty: 100 tablet, Refills: 3    folic acid (FOLVITE) 1 MG tablet Take 1 tablet (1 mg total) by mouth daily. Qty: 90 tablet, Refills: 3    guaiFENesin (MUCINEX) 600 MG 12 hr tablet Take 600 mg by mouth 2 (two) times daily as needed for cough.    ramipril (ALTACE) 2.5 MG capsule Take 1 capsule  (2.5 mg total) by mouth daily. Qty: 90 capsule, Refills: 2    cholecalciferol (VITAMIN D) 1000 UNITS tablet Take 1 tablet (1,000 Units total) by mouth daily. Qty: 100 tablet, Refills: 3    ipratropium (ATROVENT) 0.06 % nasal spray Place 2 sprays into the nose 3 (three) times daily. Qty: 15 mL, Refills: 3      STOP taking these medications     ciprofloxacin (CIPRO) 250 MG tablet      ibuprofen (ADVIL,MOTRIN) 200 MG tablet      loratadine (CLARITIN) 10 MG tablet      furosemide (LASIX) 20 MG tablet        Allergies  Allergen Reactions  . Aricept [Donepezil Hcl] Nausea And Vomiting      The results of significant diagnostics from this hospitalization (including imaging, microbiology, ancillary and laboratory) are listed below for reference.    Significant Diagnostic Studies: Ct Angio Head W/cm &/or Wo Cm  03/01/2015  CLINICAL DATA:  Stroke. Weakness and dizziness. Acute stroke in the right cingulate gyrus. EXAM: CT ANGIOGRAPHY HEAD TECHNIQUE: Multidetector CT imaging of the head was performed using the standard protocol during bolus administration of intravenous contrast. Multiplanar CT image reconstructions and MIPs were obtained to evaluate the vascular anatomy. CONTRAST:  3mL OMNIPAQUE IOHEXOL 350 MG/ML SOLN COMPARISON:  MRI 02/26/2015 FINDINGS: CT HEAD Brainstem appears unremarkable. There is an old left cerebellar infarction. Cerebral hemispheres show old infarction of the left temporal tip, old infarction in the right thalamus, and extensive chronic small vessel ischemic changes throughout the white matter. The acute cingulate gyrus infarction on the right cannot be specifically resolved. Colloid cysts measuring 7 mm is unchanged. Ventricular size is stable. No extra-axial collection. No blood products. CTA HEAD Anterior circulation: Both internal carotid arteries show aneurysmal dilatation beneath the skullbase, transverse diameter 7 mm on the left and the right. Both vessels are  widely patent through the skullbase. There is atherosclerotic calcification in the siphon regions without stenosis estimated at no more than about 30%. Supra clinoid internal carotid artery shows circumferential calcification with stenosis estimated at 30%. Immediately beyond that, there is aneurysmal dilatation of the supra clinoid ICA on the left with diameter of 7 mm. Supra clinoid ICA on the right is ectatic with maximal diameter of 5 mm. The anterior and middle cerebral vessels are patent proximally. There is coarse calcification in the right M1 and M2 region. No stenosis greater than 30% is present. Right pericallosal artery is attenuated and occluded at about the level of the anterior body of the corpus callosum. Distal branch vessels do show diffuse atherosclerotic irregularity throughout. Posterior circulation: Both vertebral arteries are widely patent to the basilar. The basilar shows atherosclerotic irregularity but no stenosis. Mild aneurysmal dilatation of the proximal basilar with a diameter up to 6 mm. Posterior circulation branch vessels are patent. Left PCA receives most of  it supply from the anterior circulation. 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 Advanced atherosclerotic narrowing and irregularity of the distal branch vessels diffusely. No correctable proximal stenosis or proximal occlusion. 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 Aneurysmal dilatation of the supra clinoid internal carotid arteries bilaterally, 7 mm on the left at 5 mm on the right. Aneurysmal dilatation of the proximal vertebral artery measuring 6 mm. Electronically Signed   By: Nelson Chimes M.D.   On: 03/01/2015 18:27   Dg Chest 2 View  02/25/2015   CLINICAL DATA:  Eating dinner and became weak. EXAM: CHEST  2 VIEW COMPARISON:  03/19/2014 FINDINGS: Previous median sternotomy and CABG procedure. Aortic atherosclerosis noted. Aneurysmal dilatation of the ascending thoracic aorta is again noted and appears similar in size to 2/20 2/16. No pleural effusion identified. No airspace consolidation or atelectasis. Degenerative disc disease is noted throughout the thoracic spine. IMPRESSION: No active cardiopulmonary disease. Similar appearance of ascending aortic aneurysm. Electronically Signed   By: Kerby Moors M.D.   On: 02/25/2015 21:32   Ct Head Wo Contrast  02/25/2015  CLINICAL DATA:  82 year old female with weakness. EXAM: CT HEAD WITHOUT CONTRAST TECHNIQUE: Contiguous axial images were obtained from the base of the skull through the vertex without intravenous contrast. COMPARISON:  Head CT 01/08/2015 FINDINGS: No intracranial hemorrhage, mass effect, or midline shift. Left temporal craniotomy with subjacent encephalomalacia, stable. Colloid cyst measures 7 mm, unchanged in size in appearance from prior allowing for differences in caliper placement. Atrophy and chronic small vessel ischemia unchanged. Prior lacunar infarct in the left cerebellum and probable left periventricular frontal lobe, stable. No hydrocephalus. The basilar cisterns are patent. No evidence of territorial infarct. No intracranial fluid collection. Calvarium is intact. Included paranasal sinuses and mastoid air cells are well aerated. IMPRESSION: 1.  No acute intracranial abnormality. 2. Stable postsurgical and chronic change. Electronically Signed   By: Jeb Levering M.D.   On: 02/25/2015 23:14   Mr Brain Wo Contrast  02/26/2015  CLINICAL DATA:  Increased weakness. Dizziness. 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 EXAM: MRI HEAD WITHOUT CONTRAST TECHNIQUE: Multiplanar, multiecho pulse sequences of the brain and surrounding structures were  obtained without intravenous contrast. COMPARISON:  Head CT 02/25/2015 and MRI 11/11/2013 FINDINGS: There is a 1 cm focus of restricted diffusion in the right cingulate gyrus consistent with acute ACA territory infarct. A few adjacent punctate foci of restricted diffusion are also noted more posteriorly. A small curvilinear focus of FLAIR hyperintensity in the interhemispheric fissure in this region may reflect slow flow or thrombosis of an ACA branch vessel responsible for this infarct. Multiple chronic microhemorrhages in both cerebellar hemispheres as well as a few scattered cerebral hemispheric microhemorrhages are unchanged from the prior MRI. Chronic infarcts are again seen in the right thalamus and left cerebellum. There are also chronic infarcts in the right cerebellum and left frontal white matter which are new from the prior MRI. Patchy to confluent T2 hyperintensities in the subcortical and deep cerebral white matter bilaterally have slightly progressed from the prior MRI and are nonspecific but compatible with moderate chronic small vessel ischemic disease. Sequelae of left temporal craniotomy are again identified with unchanged underlying left temporal lobe encephalomalacia. No mass is identified on this  unenhanced study. There is no midline shift or extra-axial fluid collection. Prior bilateral cataract extraction is noted. A right maxillary sinus mucous retention cyst is again seen. There is an unchanged, trace right mastoid effusion. Major intracranial vascular flow voids are preserved. IMPRESSION: 1. Small, acute right ACA territory infarct involving the cingulate gyrus. 2. Moderate chronic small vessel ischemic disease, progressed from 2015 with additional chronic infarcts as above. Electronically Signed   By: Logan Bores M.D.   On: 02/26/2015 08:59    Microbiology: No results found for this or any previous visit (from the past 240 hour(s)).   Labs: Basic Metabolic Panel:  Recent  Labs Lab 02/25/15 2103 02/26/15 0540  NA 137 137  K 4.5 4.2  CL 102 102  CO2 24 25  GLUCOSE 143* 112*  BUN 30* 27*  CREATININE 1.37* 1.12*  CALCIUM 9.0 9.0   Liver Function Tests:  Recent Labs Lab 02/25/15 2103 02/26/15 0540  AST 17 16  ALT 10* 11*  ALKPHOS 81 78  BILITOT 0.6 0.8  PROT 6.1* 5.8*  ALBUMIN 3.6 3.2*   No results for input(s): LIPASE, AMYLASE in the last 168 hours. No results for input(s): AMMONIA in the last 168 hours. CBC:  Recent Labs Lab 02/25/15 2103 02/26/15 0540  WBC 7.4 5.1  NEUTROABS 5.4 3.0  HGB 12.8 12.3  HCT 37.8 36.0  MCV 103.0* 100.6*  PLT 189 177   Cardiac Enzymes:  Recent Labs Lab 02/26/15 0540  TROPONINI 0.03   BNP: BNP (last 3 results) No results for input(s): BNP in the last 8760 hours.  ProBNP (last 3 results) No results for input(s): PROBNP in the last 8760 hours.  CBG:  Recent Labs Lab 02/26/15 0852 02/26/15 1630  GLUCAP 103* 98     Signed:  Velvet Bathe MD.  [facility] 03/02/2015, 3:10 PM

## 2015-03-02 NOTE — Progress Notes (Signed)
STROKE TEAM PROGRESS NOTE   HISTORY AT THE TIME OF ADMISSION [name] is an 82 y.o. female history of CNS lymphoma status post surgery in remission, bioprosthetic aortic valve replacement and ASD repair, previous history of nonischemic cardiomyopathy improved last EF measured in September 2016 was 65-70%, was brought to the ER after patient was found to be weak. Patient was having dinner at a restaurant when patient was found to be weak and diaphoretic. As per the ER physician and nursing notes patient was hypotensive on arrival. Improved with fluids. Per friend, she was out eating dinner with a man friend and became "pale and diaphoretic. EMS was called and she was brought to ED. She had generalized weakness and improved with fluids." MRI was obtained and does show a right ACA stroke. ECG shows PAC but no Afib. OF note, she was recently diagnosed with UTI and placed on Cipro. Had only taken 2 pills.   Date last known well: 2.2.2017 Time last known well: Time: 20:00 tPA Given: No: out of window   SUBJECTIVE (INTERVAL HISTORY) Her  son is   at the bedside.  Overall she feels her condition is improving. Patient's son has mental with electrophysiology M.D. and decided not to undergo the loop recorder as he feels she may not be a good long-term anticoagulation candidate. She is presently nothing by mouth for TEE but I plan to cancel that as well OBJECTIVE Temp:  [97.6 F (36.4 C)-97.9 F (36.6 C)] 97.6 F (36.4 C) (02/07 0517) Pulse Rate:  [90-92] 90 (02/07 0517) Cardiac Rhythm:  [-] Normal sinus rhythm (02/07 1048) Resp:  [16-18] 16 (02/07 0517) BP: (160-180)/(95-101) 160/95 mmHg (02/07 0517) SpO2:  [97 %-99 %] 97 % (02/07 0517) Weight:  [125 lb 6.4 oz (56.881 kg)] 125 lb 6.4 oz (56.881 kg) (02/07 0517)  CBC:   Recent Labs Lab 02/25/15 2103 02/26/15 0540  WBC 7.4 5.1  NEUTROABS 5.4 3.0  HGB 12.8 12.3  HCT 37.8 36.0  MCV 103.0* 100.6*  PLT 189 [id number]    Basic Metabolic Panel:    Recent Labs Lab 02/25/15 2103 02/26/15 0540  NA 137 137  K 4.5 4.2  CL 102 102  CO2 24 25  GLUCOSE 143* 112*  BUN 30* 27*  CREATININE 1.37* 1.12*  CALCIUM 9.0 9.0    Lipid Panel:     Component Value Date/Time   CHOL 163 02/27/2015 0536   TRIG 105 02/27/2015 0536   HDL 40* 02/27/2015 0536   CHOLHDL 4.1 02/27/2015 0536   VLDL 21 02/27/2015 0536   LDLCALC 102* 02/27/2015 0536   HgbA1c:  Lab Results  Component Value Date   HGBA1C 6.0* 02/26/2015   Urine Drug Screen:     Component Value Date/Time   LABOPIA NONE DETECTED 02/25/2015 2203   COCAINSCRNUR NONE DETECTED 02/25/2015 2203   LABBENZ NONE DETECTED 02/25/2015 2203   AMPHETMU NONE DETECTED 02/25/2015 2203   THCU NONE DETECTED 02/25/2015 2203   LABBARB NONE DETECTED 02/25/2015 2203      IMAGING  Dg Chest 2 View 02/25/2015   No active cardiopulmonary disease. Similar appearance of ascending aortic aneurysm.   Ct Head Wo Contrast 02/25/2015   1.  No acute intracranial abnormality.  2. Stable postsurgical and chronic change.   Mr Brain Wo Contrast 02/26/2015   1. Small, acute right ACA territory infarct involving the cingulate gyrus.  2. Moderate chronic small vessel ischemic disease, progressed from 2015 with additional chronic infarcts as above.   PHYSICAL EXAM  HEENT- Normocephalic, no lesions, without obvious abnormality. Normal external eye and conjunctiva. Normal external nose, mucus membranes and septum. PERRL Cardiovascular- S1, S2 normal, pulses palpable throughout; loud harsh murmur c/w prosthetic valves  Lungs- no tachypnea, retractions or cyanosis Abdomen- normal findings: bowel sounds normal Extremities- no edema  Neurological Examination Mental Status: Alert, oriented, thought content appropriate. Speech fluent without evidence of aphasia. Able to follow 3 step commands needing some help but able to follow simple commands without problems.   Cranial Nerves: II: Visual fields grossly  normal, pupils equal, round, reactive to light and accommodation III,IV, VI: extra-ocular motions intact  V,VII: smile symmetric, facial light touch sensation normal bilaterally VIII: hearing grossly intact IX,X: uvula rises symmetrically XI: bilateral shoulder shrug XII: midline tongue extension  Motor: Right :Upper extremity 5/5Left: Upper extremity 5/5 Lower extremity 5/5Lower extremity 5/5  Sensory: Light touch intact throughout, bilaterally  Cerebellar: normal finger-to-nose, and normal heel-to-shin test  Gait: not tested  ASSESSMENT/PLAN [name] is a 82 y.o. female with history of nonischemic cardiomyopathy, hyperlipidemia, hypertension, previous TIA, previous strokes, S/P bioprosthetic aortic valve replacement with ASD repair and CNS lymphoma presenting with weakness and hypotension. She did not receive IV t-PA due to late presentation.   Stroke:  Non-dominant small, acute right ACA territory infarct secondary to hypotension.  Resultant  Transient decrease in responsiveness  MRI - Small, acute right ACA territory infarct involving the cingulate gyrus as well as remote infarcts.  MRA - Not performed.  Carotid Doppler  - Will order.  2D Echo - EF 60-65%. No cardiac source of emboli identified.   LDL  - 102; will start statin  HgbA1c - 6.0  VTE prophylaxis - Lovenox Diet Heart Room service appropriate?: Yes; Fluid consistency:: Thin Diet - low sodium heart healthy  No antithrombotic prior to admission, now on No antithrombotic Will order Aspirin 81 mg daily.  Patient counseled to be compliant with her antithrombotic medications  Ongoing aggressive stroke risk factor management  Therapy recommendations: - SNF recommended.  Disposition: pending  Hypertension  Stable  Permissive hypertension (OK if < 220/120) but gradually normalize  in 5-7 days  Hyperlipidemia  Home meds: No lipid lowering medications prior to admission.  LDL 102, goal < 70  Now on Lipitor 20 mg daily.  Continue statin at discharge  Other Stroke Risk Factors  Advanced age  Hx stroke/TIA  Family hx stroke (Father)  Other Active Problems  Renal insufficiency  Recent fall  Macrocytic anemia; on [facility] day # 4          Patient was seen and examined by me personally. Documentation reflects findings. The laboratory and radiographic studies reviewed by me. ROS completed by me personally and pertinent positives fully documented     Assessment and plan completed by me personally and fully documented above. Plans/Recommendations include:  Patient remains at risk for recurrent stroke, TIA  and may have underlying atrial fibrillation but given her advanced age and high risk for bleeding complications she is not a candidate for long-term anticoagulation and hence we will cancel TEE and loop recorder. I had a long discussion with the patient's son at the bedside and answered questions. Stroke team will sign off at the present time. Currently call for questions   Antony Contras, MD    To contact Stroke Continuity provider, please refer to [email or web address] After hours, contact General Neurology

## 2015-03-03 ENCOUNTER — Telehealth: Payer: Self-pay | Admitting: *Deleted

## 2015-03-03 ENCOUNTER — Encounter: Payer: Self-pay | Admitting: Internal Medicine

## 2015-03-03 ENCOUNTER — Non-Acute Institutional Stay (SKILLED_NURSING_FACILITY): Payer: Medicare Other | Admitting: Internal Medicine

## 2015-03-03 DIAGNOSIS — I10 Essential (primary) hypertension: Secondary | ICD-10-CM

## 2015-03-03 DIAGNOSIS — E46 Unspecified protein-calorie malnutrition: Secondary | ICD-10-CM

## 2015-03-03 DIAGNOSIS — N289 Disorder of kidney and ureter, unspecified: Secondary | ICD-10-CM | POA: Diagnosis not present

## 2015-03-03 DIAGNOSIS — E785 Hyperlipidemia, unspecified: Secondary | ICD-10-CM

## 2015-03-03 DIAGNOSIS — R5381 Other malaise: Secondary | ICD-10-CM

## 2015-03-03 DIAGNOSIS — F0280 Dementia in other diseases classified elsewhere without behavioral disturbance: Secondary | ICD-10-CM | POA: Diagnosis not present

## 2015-03-03 DIAGNOSIS — I639 Cerebral infarction, unspecified: Secondary | ICD-10-CM | POA: Diagnosis not present

## 2015-03-03 DIAGNOSIS — R05 Cough: Secondary | ICD-10-CM

## 2015-03-03 DIAGNOSIS — F4321 Adjustment disorder with depressed mood: Secondary | ICD-10-CM

## 2015-03-03 DIAGNOSIS — N390 Urinary tract infection, site not specified: Secondary | ICD-10-CM

## 2015-03-03 DIAGNOSIS — R059 Cough, unspecified: Secondary | ICD-10-CM

## 2015-03-03 DIAGNOSIS — G3183 Dementia with Lewy bodies: Secondary | ICD-10-CM

## 2015-03-03 SURGERY — ECHOCARDIOGRAM, TRANSESOPHAGEAL
Anesthesia: Moderate Sedation

## 2015-03-03 NOTE — Progress Notes (Signed)
Patient ID: [name], female   DOB: [birth year], 82 y.o.   MRN: [id number]    LOCATION: [facility]  PCP: Walker Kehr, MD   Code Status: DNR  Goals of care: Advanced Directive information Advanced Directives 03/03/2015  Does patient have an advance directive? Yes  Type of Advance Directive Out of facility DNR (pink MOST or yellow form)  Does patient want to make changes to advanced directive? No - Patient declined  Copy of advanced directive(s) in chart? Yes  Would patient like information on creating an advanced directive? -    Extended Emergency Contact Information Primary Emergency Contact: Lawley,Michael Address: [place] [address] Johnnette Litter of [place] Phone: [phone] Relation: Son Secondary Emergency Contact: Earlie Lou [place] of [place] Phone: [phone] Relation: Friend   Allergies  Allergen Reactions  . Aricept [Donepezil Hcl] Nausea And Vomiting    Chief Complaint  Patient presents with  . New Admit To SNF    New Admission     HPI:  Patient is a 82 y.o. female seen today for short term rehabilitation post hospital admission from 02/25/15-03/02/15 with near syncope and generalized weakness. She had imaging of her head which showed right ACA infarct. She also had a UTI. She was seen by neurology and cardiology and continued on aspirin and lipitor. She was treated with antibiotics for her UTI. She has PMH of HTN, CHF, AVR, CNS lymphoma in remission among others. She is seen in her room today with her son and friend present. She complaints of cough with some stuffiness to her throat and is unable to bring out her phlegm. Has runny nose at times. She also complaints of left knee pain. She feels weak and tired.  Review of Systems:  Constitutional: Negative for fever, chills and diaphoresis.  HENT: Negative for headache, congestion, sore throat, difficulty swallowing.   Eyes: Negative for  blurred  vision, double vision and discharge.  Respiratory: Negative for shortness of breath and wheezing.   Cardiovascular: Negative for chest pain, palpitations, leg swelling.  Gastrointestinal: Negative for heartburn, nausea, vomiting, abdominal pain. Had bowel movement yesterday Genitourinary: Negative for dysuria Musculoskeletal: Negative for back pain, fall in the facility but is a high fall risk Skin: Negative for itching, rash.  Neurological: Negative for dizziness Psychiatric/Behavioral: positive for depression    Past Medical History  Diagnosis Date  . Nonischemic cardiomyopathy [facility]) 2007 / Feb 2009    Miminial non-obs CAD cath (2007); EF previously 25%;  55% by echo 2012;  Echo 4/14:  mild LVH; mod asymmetric hypertrophy of the septum, EF 55-60%, Gr 1 DD, AVR ok (mean gradient 9), MAC, mild LAE, PASP 31  . Aortic insufficiency October 2007    Severe with ascending thoracic aneurysm; s/p pericardial tissue AVR and Heamsheild graft in October 2007; c/b pericardial effusion requiring window;  echo 03/2010: inferobasal HK, severe LVH, EF 55-60%, dynamic obstruction during Valsalva in mid cavity, peak velocity 232 cm/sec, peak gradient 22 mmHg, AVR ok, mod dilated aorta, mild LAE  . Hyperlipidemia   . Hypertension   . GERD (gastroesophageal reflux disease)   . History of cholelithiasis     Gallbladder dysfunction  . History of non anemic vitamin B12 deficiency   . Allergic rhinitis   . TIA (transient ischemic attack) October 2010  . Brain cancer ([place])   . CNS lymphoma ([place])   . History of recent fall 09/2013+    >  2 falls year   Past Surgical History  Procedure Laterality Date  . Tonsillectomy    . Aortic valve replacement  2007  . Bilateral vein stripping    . Left temporal tumor resected      B cell lymphoma   Social History:   reports that she has never smoked. She has never used smokeless tobacco. She reports that she does not drink alcohol or use illicit drugs.  Family  History  Problem Relation Age of Onset  . Stroke Father   . Arthritis Other   . Coronary artery disease Other     Female 1st degree relative <50  . Diabetes Other   . Cerebral aneurysm Son     Medications:   Medication List       This list is accurate as of: 03/03/15 11:15 AM.  Always use your most recent med list.               aspirin 81 MG EC tablet  Take 1 tablet (81 mg total) by mouth daily.     atorvastatin 20 MG tablet  Commonly known as:  LIPITOR  Take 1 tablet (20 mg total) by mouth daily at 6 PM.     cephALEXin 250 MG capsule  Commonly known as:  KEFLEX  Take 1 capsule (250 mg total) by mouth every 12 (twelve) hours.     cholecalciferol 1000 units tablet  Commonly known as:  VITAMIN D  Take 1 tablet (1,000 Units total) by mouth daily.     folic acid 1 MG tablet  Commonly known as:  FOLVITE  Take 1 tablet (1 mg total) by mouth daily.     guaiFENesin 600 MG 12 hr tablet  Commonly known as:  MUCINEX  Take 600 mg by mouth 2 (two) times daily as needed for cough.     ipratropium 0.06 % nasal spray  Commonly known as:  ATROVENT  Place 2 sprays into the nose 3 (three) times daily.     lisinopril 10 MG tablet  Commonly known as:  PRINIVIL,ZESTRIL  Take 10 mg by mouth daily.     Vitamin B-12 1000 MCG Subl  Place 1 tablet (1,000 mcg total) under the tongue daily.        Immunizations: Immunization History  Administered Date(s) Administered  . Influenza Split 02/10/2011  . Influenza,inj,Quad PF,36+ Mos 10/14/2012, 09/19/2013  . Influenza-Unspecified 09/28/2014  . Pneumococcal Conjugate-13 10/14/2012, 03/17/2013, 09/19/2013  . Tetanus 02/10/2011  . Zoster 07/04/2013     Physical Exam: Filed Vitals:   03/03/15 1107  BP: 159/94  Pulse: 96  Temp: 98 F (36.7 C)  TempSrc: Oral  Resp: 18  Height: 5\' 4"  (1.626 m)  Weight: 124 lb (56.246 kg)  SpO2: 95%   Body mass index is 21.27 kg/(m^2).  General- elderly female, well built, in no acute  distress Head- normocephalic, atraumatic Nose- no maxillary or frontal sinus tenderness, no nasal discharge Throat- moist mucus membrane  Eyes- PERRLA, EOMI, no pallor, no icterus, no discharge, normal conjunctiva, normal sclera Neck- no cervical lymphadenopathy Cardiovascular- normal s1,s2, no murmurs, no leg edema Respiratory- bilateral clear to auscultation, no wheeze, no rhonchi, no crackles, no use of accessory muscles Abdomen- bowel sounds present, soft, non tender Musculoskeletal- able to move all 4 extremities, generalized weakness, limited left knee range of motion with crepitus present. No effusion noted Neurological- no focal deficit, alert and oriented to person and place Skin- warm and dry Psychiatry- depressed    Labs reviewed:  Basic Metabolic Panel:  Recent Labs  01/06/15 1224 02/25/15 2103 02/26/15 0540  NA 137 137 137  K 4.3 4.5 4.2  CL 100 102 102  CO2 28 24 25   GLUCOSE 100* 143* 112*  BUN 26* 30* 27*  CREATININE 1.19 1.37* 1.12*  CALCIUM 9.4 9.0 9.0   Liver Function Tests:  Recent Labs  01/06/15 1224 02/25/15 2103 02/26/15 0540  AST 15 17 16   ALT 7 10* 11*  ALKPHOS 84 81 78  BILITOT 1.1 0.6 0.8  PROT 6.8 6.1* 5.8*  ALBUMIN 4.1 3.6 3.2*   No results for input(s): LIPASE, AMYLASE in the last 8760 hours. No results for input(s): AMMONIA in the last 8760 hours. CBC:  Recent Labs  01/06/15 1224 02/25/15 2103 02/26/15 0540  WBC 5.5 7.4 5.1  NEUTROABS 3.7 5.4 3.0  HGB 13.3 12.8 12.3  HCT 39.7 37.8 36.0  MCV 101.3* 103.0* 100.6*  PLT 236.0 189 177   Cardiac Enzymes:  Recent Labs  01/06/15 1224 02/26/15 0540  CKMB 1.8  --   TROPONINI  --  0.03   BNP: Invalid input(s): POCBNP CBG:  Recent Labs  02/26/15 0852 02/26/15 1630  GLUCAP 103* 98    Radiological Exams: Ct Angio Head W/cm &/or Wo Cm  03/01/2015  CLINICAL DATA:  Stroke. Weakness and dizziness. Acute stroke in the right cingulate gyrus. EXAM: CT ANGIOGRAPHY HEAD  TECHNIQUE: Multidetector CT imaging of the head was performed using the standard protocol during bolus administration of intravenous contrast. Multiplanar CT image reconstructions and MIPs were obtained to evaluate the vascular anatomy. CONTRAST:  58mL OMNIPAQUE IOHEXOL 350 MG/ML SOLN COMPARISON:  MRI 02/26/2015 FINDINGS: CT HEAD Brainstem appears unremarkable. There is an old left cerebellar infarction. Cerebral hemispheres show old infarction of the left temporal tip, old infarction in the right thalamus, and extensive chronic small vessel ischemic changes throughout the white matter. The acute cingulate gyrus infarction on the right cannot be specifically resolved. Colloid cysts measuring 7 mm is unchanged. Ventricular size is stable. No extra-axial collection. No blood products. CTA HEAD Anterior circulation: Both internal carotid arteries show aneurysmal dilatation beneath the skullbase, transverse diameter 7 mm on the left and the right. Both vessels are widely patent through the skullbase. There is atherosclerotic calcification in the siphon regions without stenosis estimated at no more than about 30%. Supra clinoid internal carotid artery shows circumferential calcification with stenosis estimated at 30%. Immediately beyond that, there is aneurysmal dilatation of the supra clinoid ICA on the left with diameter of 7 mm. Supra clinoid ICA on the right is ectatic with maximal diameter of 5 mm. The anterior and middle cerebral vessels are patent proximally. There is coarse calcification in the right M1 and M2 region. No stenosis greater than 30% is present. Right pericallosal artery is attenuated and occluded at about the level of the anterior body of the corpus callosum. Distal branch vessels do show diffuse atherosclerotic irregularity throughout. Posterior circulation: Both vertebral arteries are widely patent to the basilar. The basilar shows atherosclerotic irregularity but no stenosis. Mild aneurysmal  dilatation of the proximal basilar with a diameter up to 6 mm. Posterior circulation branch vessels are patent. Left PCA receives most of it supply from the anterior circulation. 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 Advanced atherosclerotic narrowing and irregularity of the distal branch vessels diffusely.  No correctable proximal stenosis or proximal occlusion. 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 Aneurysmal dilatation of the supra clinoid internal carotid arteries bilaterally, 7 mm on the left at 5 mm on the right. Aneurysmal dilatation of the proximal vertebral artery measuring 6 mm. Electronically Signed   By: Nelson Chimes M.D.   On: 03/01/2015 18:27   Dg Chest 2 View  02/25/2015  CLINICAL DATA:  Eating dinner and became weak. EXAM: CHEST  2 VIEW COMPARISON:  03/19/2014 FINDINGS: Previous median sternotomy and CABG procedure. Aortic atherosclerosis noted. Aneurysmal dilatation of the ascending thoracic aorta is again noted and appears similar in size to 2/20 2/16. No pleural effusion identified. No airspace consolidation or atelectasis. Degenerative disc disease is noted throughout the thoracic spine. IMPRESSION: No active cardiopulmonary disease. Similar appearance of ascending aortic aneurysm. Electronically Signed   By: Kerby Moors M.D.   On: 02/25/2015 21:32   Ct Head Wo Contrast  02/25/2015  CLINICAL DATA:  81 year old female with weakness. EXAM: CT HEAD WITHOUT CONTRAST TECHNIQUE: Contiguous axial images were obtained from the base of the skull through the vertex without intravenous contrast. COMPARISON:  Head CT 01/08/2015 FINDINGS: No intracranial hemorrhage, mass effect, or midline shift. Left temporal craniotomy with  subjacent encephalomalacia, stable. Colloid cyst measures 7 mm, unchanged in size in appearance from prior allowing for differences in caliper placement. Atrophy and chronic small vessel ischemia unchanged. Prior lacunar infarct in the left cerebellum and probable left periventricular frontal lobe, stable. No hydrocephalus. The basilar cisterns are patent. No evidence of territorial infarct. No intracranial fluid collection. Calvarium is intact. Included paranasal sinuses and mastoid air cells are well aerated. IMPRESSION: 1.  No acute intracranial abnormality. 2. Stable postsurgical and chronic change. Electronically Signed   By: Jeb Levering M.D.   On: 02/25/2015 23:14   Mr Brain Wo Contrast  02/26/2015  CLINICAL DATA:  Increased weakness. Dizziness. 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 EXAM: MRI HEAD WITHOUT CONTRAST TECHNIQUE: Multiplanar, multiecho pulse sequences of the brain and surrounding structures were obtained without intravenous contrast. COMPARISON:  Head CT 02/25/2015 and MRI 11/11/2013 FINDINGS: There is a 1 cm focus of restricted diffusion in the right cingulate gyrus consistent with acute ACA territory infarct. A few adjacent punctate foci of restricted diffusion are also noted more posteriorly. A small curvilinear focus of FLAIR hyperintensity in the interhemispheric fissure in this region may reflect slow flow or thrombosis of an ACA branch vessel responsible for this infarct. Multiple chronic microhemorrhages in both cerebellar hemispheres as well as a few scattered cerebral hemispheric microhemorrhages are unchanged from the prior MRI. Chronic infarcts are again seen in the right thalamus and left cerebellum. There are also chronic infarcts in the right cerebellum and left frontal white matter which are new from the prior MRI. Patchy to confluent T2 hyperintensities in the subcortical and deep cerebral white matter bilaterally have slightly  progressed from the prior MRI and are nonspecific but compatible with moderate chronic small vessel ischemic disease. Sequelae of left temporal craniotomy are again identified with unchanged underlying left temporal lobe encephalomalacia. No mass is identified on this unenhanced study. There is no midline shift or extra-axial fluid collection. Prior bilateral cataract extraction is noted. A right maxillary sinus mucous retention cyst is again seen. There is an unchanged, trace right mastoid effusion. Major intracranial vascular flow voids are preserved. IMPRESSION: 1. Small, acute right ACA territory infarct involving the  cingulate gyrus. 2. Moderate chronic small vessel ischemic disease, progressed from 2015 with additional chronic infarcts as above. Electronically Signed   By: Logan Bores M.D.   On: 02/26/2015 08:59    Assessment/Plan  Physical deconditioning Will have her work with physical therapy and occupational therapy team to help with gait training and muscle strengthening exercises.fall precautions. Skin care. Encourage to be out of bed.   Acute CVA Will have her work with PT, OT and SLP team. Fall precautions. Get scoop gel overlay mattress to avoid pressure sores with her weakness and limited mobility for now. Continue baby aspirin and lipitor for now.  Protein calorie malnutrition Get dietary consult. Will need nutritional supplement. Weekly weight for now. Continue [id number] and folic acid supplement  UTI Continue and complete course of keflex on 03/04/15. Maintain hydration  Depression Situational at present. Get psychotherapy consult to evaluate further and if counselling does not help, will get psychiatry consult.  HTN Elevated BP. Continue lisinopril 10 mg daily, check bp q shift for now and reassess. Check bmp  Cough Start on mucinex DM 600 mg bid x 1 week and reassess. Aspiration precautions  HLD Continue lipitor  Dementia To provide assistance with ADLs. Pressure ulcer  prevention.   Left knee pain Appears to have OA. Get tylenol 650 mg bid for now with biofreeze gel 4% bid to knee for a week and reassess. Continue vitamin d supplement  Impaired renal function Check bmp   Goals of care: short term rehabilitation   Labs/tests ordered: cbc, cmp 03/08/15  Family/ staff Communication: reviewed care plan with patient, her son and nursing supervisor    Blanchie Serve, MD Internal Medicine [address], [place] [address] Cell Phone (Monday-Friday 8 am - 5 pm): [phone] On Call: [phone] and follow prompts after 5 pm and on weekends Office Phone: [phone] Office Fax: [phone]

## 2015-03-03 NOTE — Telephone Encounter (Signed)
Pt was on TCM list admitted for Syncope, and D/c 03/02/15. Pt will be f/u with cardiology...Johny Chess

## 2015-03-08 LAB — HEPATIC FUNCTION PANEL
ALK PHOS: 86 U/L (ref 25–125)
ALT: 10 U/L (ref 7–35)
AST: 15 U/L (ref 13–35)
BILIRUBIN, TOTAL: 0.8 mg/dL

## 2015-03-08 LAB — BASIC METABOLIC PANEL
BUN: 27 mg/dL — AB (ref 4–21)
CREATININE: 1.2 mg/dL — AB (ref 0.5–1.1)
Glucose: 99 mg/dL
Potassium: 4.5 mmol/L (ref 3.4–5.3)
Sodium: 138 mmol/L (ref 137–147)

## 2015-03-08 LAB — CBC AND DIFFERENTIAL
HCT: 38 % (ref 36–46)
HEMOGLOBIN: 12.7 g/dL (ref 12.0–16.0)
NEUTROS ABS: 3 /uL
PLATELETS: 252 10*3/uL (ref 150–399)
WBC: 5.5 10^3/mL

## 2015-03-18 ENCOUNTER — Ambulatory Visit (HOSPITAL_BASED_OUTPATIENT_CLINIC_OR_DEPARTMENT_OTHER): Payer: Medicare Other | Admitting: Oncology

## 2015-03-18 VITALS — BP 113/79 | HR 84 | Temp 97.8°F | Resp 17 | Ht 64.0 in | Wt 128.1 lb

## 2015-03-18 DIAGNOSIS — R413 Other amnesia: Secondary | ICD-10-CM

## 2015-03-18 DIAGNOSIS — R260 Ataxic gait: Secondary | ICD-10-CM | POA: Diagnosis not present

## 2015-03-18 DIAGNOSIS — I10 Essential (primary) hypertension: Secondary | ICD-10-CM

## 2015-03-18 DIAGNOSIS — C8589 Other specified types of non-Hodgkin lymphoma, extranodal and solid organ sites: Secondary | ICD-10-CM | POA: Diagnosis not present

## 2015-03-18 DIAGNOSIS — I639 Cerebral infarction, unspecified: Secondary | ICD-10-CM | POA: Diagnosis not present

## 2015-03-18 DIAGNOSIS — R0600 Dyspnea, unspecified: Secondary | ICD-10-CM

## 2015-03-18 DIAGNOSIS — I255 Ischemic cardiomyopathy: Secondary | ICD-10-CM | POA: Diagnosis not present

## 2015-03-18 DIAGNOSIS — C8599 Non-Hodgkin lymphoma, unspecified, extranodal and solid organ sites: Secondary | ICD-10-CM

## 2015-03-18 DIAGNOSIS — N189 Chronic kidney disease, unspecified: Secondary | ICD-10-CM | POA: Diagnosis not present

## 2015-03-18 NOTE — Progress Notes (Signed)
HPI The patient presents for evaluation of shortness of breath. She has a history of AVR in 2007.  She had a cardiomyopathy at that time with an EF of 25%. She had an echo in 2014 and the EF was well-preserved and her aortic valve replacement pericardial valve looked normal.  She had a CT which was done and ordered by Dr. Linna Darner in the past and we decided to manage her medically. She was not a candidate for surgical repair. Since I last saw her she was in the hospital in February with probable CVA. However, was decided to manage her medically. She was not thought to be an anticoagulation candidate. The echo was essentially unremarkable. She did not have further imaging with a chest CT. She has had some residual memory problem, weakness. She's actually staying at a nursing home right now getting physical therapy and he and relates about 50 yards. With this she is not having any acute complaints. She's not having any new shortness of breath, PND or orthopnea. She's not having any new palpitations, presyncope or syncope. She has some mild ankle edema. I did review the hospital records.   Allergies  Allergen Reactions  . Aricept [Donepezil Hcl] Nausea And Vomiting    Current Outpatient Prescriptions  Medication Sig Dispense Refill  . aspirin EC 81 MG EC tablet Take 1 tablet (81 mg total) by mouth daily. 30 tablet 0  . atorvastatin (LIPITOR) 20 MG tablet Take 1 tablet (20 mg total) by mouth daily at 6 PM. 30 tablet 0  . cephALEXin (KEFLEX) 250 MG capsule Take 1 capsule (250 mg total) by mouth every 12 (twelve) hours. 4 capsule 0  . cholecalciferol (VITAMIN D) 1000 UNITS tablet Take 1 tablet (1,000 Units total) by mouth daily. 100 tablet 3  . Cyanocobalamin (VITAMIN B-12) 1000 MCG SUBL Place 1 tablet (1,000 mcg total) under the tongue daily. 123XX123 tablet 3  . folic acid (FOLVITE) 1 MG tablet Take 1 tablet (1 mg total) by mouth daily. 90 tablet 3  . guaiFENesin (MUCINEX) 600 MG 12 hr tablet Take 600 mg by  mouth 2 (two) times daily as needed for cough.    Marland Kitchen ipratropium (ATROVENT) 0.06 % nasal spray Place 2 sprays into the nose 3 (three) times daily. 15 mL 3  . lisinopril (PRINIVIL,ZESTRIL) 10 MG tablet Take 10 mg by mouth daily.     No current facility-administered medications for this visit.    Past Medical History  Diagnosis Date  . Nonischemic cardiomyopathy [facility]) 2007 / Feb 2009    Miminial non-obs CAD cath (2007); EF previously 25%;  55% by echo 2012;  Echo 4/14:  mild LVH; mod asymmetric hypertrophy of the septum, EF 55-60%, Gr 1 DD, AVR ok (mean gradient 9), MAC, mild LAE, PASP 31  . Aortic insufficiency October 2007    Severe with ascending thoracic aneurysm; s/p pericardial tissue AVR and Heamsheild graft in October 2007; c/b pericardial effusion requiring window;  echo 03/2010: inferobasal HK, severe LVH, EF 55-60%, dynamic obstruction during Valsalva in mid cavity, peak velocity 232 cm/sec, peak gradient 22 mmHg, AVR ok, mod dilated aorta, mild LAE  . Hyperlipidemia   . Hypertension   . GERD (gastroesophageal reflux disease)   . History of cholelithiasis     Gallbladder dysfunction  . History of non anemic vitamin B12 deficiency   . Allergic rhinitis   . TIA (transient ischemic attack) October 2010  . Brain cancer ([employer])   . CNS lymphoma ([place])   .  History of recent fall 09/2013+    > 2 falls year    Past Surgical History  Procedure Laterality Date  . Tonsillectomy    . Aortic valve replacement  2007  . Bilateral vein stripping    . Left temporal tumor resected      B cell lymphoma   ROS: A stated in the HPI and negative for all other systems.  PHYSICAL EXAM BP 124/98 mmHg  Pulse 70  Ht 5\' 4"  (1.626 m)  Wt 128 lb (58.06 kg)  BMI 21.96 kg/m2 GENERAL:  Frail appearing  NECK:  No jugular venous distention at 90 degrees waveform within normal limits, carotid upstroke brisk and symmetric, no bruits, no thyromegaly LYMPHATICS:  No cervical, inguinal adenopathy LUNGS:   Clear to auscultation bilaterally BACK:  No CVA tenderness CHEST:  Well healed sternotomy scar. HEART:  PMI not displaced or sustained,S1 and S2 within normal limits, no S3, no S4, no clicks, no rubs, no murmurs ABD:  Flat, positive bowel sounds normal in frequency in pitch, no bruits, no rebound, exam compromised by the patient seated  EXT:  2 plus pulses throughout,mild ankle edema, no cyanosis no clubbing   ASSESSMENT AND PLAN  AVR:  She had a normally functioning valve on echo recently.  No change in therapy is indicated.    AORTIC ENLARGEMENT: This will be managed medically.  Of note she did have another CT study in September when she was at her daughter's house in [place] and was thought to possibly have a PE.  The ascending and thoracic aorta was said to be stable.   HTN:  Her blood pressure is  slightly elevated today. She is getting it checked weight frequently at the nursing home where she is [name]. I gave her daughter instructions to get those readings to me as she might need some up titration of her medications. In the past her blood pressures have fluctuated

## 2015-03-18 NOTE — Progress Notes (Signed)
[facility] OFFICE PROGRESS NOTE   Diagnosis: CNS lymphoma  INTERVAL HISTORY:   [name] returns as scheduled. [name] is accompanied by [name].[name] was admitted 02/02/2017with an acute CVA.[name] was also diagnosed with a urinary tract infection. [name] reports the CVA caused speech difficulty and left-sided weakness. Norma symptoms have improved. [name] now resides in a skilled nursing facility. An MRI of the brain to 3 2017 revealed a 1 cm focus of restricted diffusion in the right cingulate gyrus consistent with an acute CVA. Sequelae of a left temporal craniotomy are again identified with unchanged left temporal lobe encephalomalacia. No mass is identified Chronic small vessel ischemic changes have progressed compared to an MRI in 2015.   Objective:  Vital signs in last 24 hours:  Blood pressure 113/79, pulse 84, temperature 97.8 F (36.6 C), temperature source Oral, resp. rate 17, height 5\' 4"  (1.626 m), weight 128 lb 1.6 oz (58.106 kg), SpO2 99 %.    Resp: rhonchi at the left posterior base, no respiratory distress Cardio: regular rhythm with premature beats GI: no hepatosplenomegaly Vascular: no leg edema Neuro:alert, oriented to year, not oriented to diagnosis. [name] follows commands. The motor exam appears intact in the upper and lower extremities. [name] was able to ambulate in the examination room with assistance.      Lab Results:  Lab Results  Component Value Date   WBC 5.1 02/26/2015   HGB 12.3 02/26/2015   HCT 36.0 02/26/2015   MCV 100.6* 02/26/2015   PLT 177 02/26/2015   NEUTROABS 3.0 02/26/2015     No results found.  Medications: I have reviewed the patient's current medications.  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  1. 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   2. Initiation of weekly rituximab therapy 08/31/2010, status post week number 4 on 09/23/2010. 3. Restaging MRI of the brain 10/07/2010 without evidence of disease progression. 4. Initiation of every 2 month "maintenance" rituximab on 11/16/2010. Last treated on 09/15/2011 5. Restaging MRI of the brain 02/06/2011 unchanged aside from a slight increase in altered T2 signal in the subcortical region at the anterior left temporal lobe. 6. Restaging MRI of the brain 07/07/2011-minimal change in appearance of T2 hyperintensity involving the anterior left temporal lobe 7. Restaging MRI of the brain 01/15/2012-no significant change to suggest progression of tumor given limits of the unenhanced study 8. MRI of brain 03/03/2013 with stable postoperative changes. 9. MRI brain 11/19/2013 with unchanged postoperative appearance of the left temporal lobe, no recurrent mass 10. MRI brain 02/26/2015 with stable surgical changes at the left temporal lobe, small acute ACA territory infarct.  2. Gait ataxia and memory deficit, question related to the central nervous system lymphoma. The ataxia appears worse today  3. Hypertension. 4. Nonischemic cardiomyopathy. 5. Status post aortic valve replacement. 6. Transient ischemic attack in 2010. 7. Chronic renal insufficiency 8. Chronic exertional dyspnea-likely related to the cardiomyopathy. 9. 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 [name] is now almost 5 years out from diagnosis.[name] has a good prognosis with regard to the CNS lymphoma.  [name] has multiple comorbid conditions including a recent CVA and significant dementia. I discussed the situation with [name]. [name] can no longer live alone. [name] plans to relocate to [place] to be closer to Norma  [name]. [name] will live in an assisted living facility there. I discharged Norma from the Oncology clinic today. I am available to see Norma  in the future as needed.  Norma Coder, [name]  03/18/2015  3:27 PM

## 2015-03-19 ENCOUNTER — Encounter: Payer: Self-pay | Admitting: Cardiology

## 2015-03-19 ENCOUNTER — Ambulatory Visit (INDEPENDENT_AMBULATORY_CARE_PROVIDER_SITE_OTHER): Payer: Medicare Other | Admitting: Cardiology

## 2015-03-19 VITALS — BP 124/98 | HR 70 | Ht 64.0 in | Wt 128.0 lb

## 2015-03-19 DIAGNOSIS — Z952 Presence of prosthetic heart valve: Secondary | ICD-10-CM

## 2015-03-19 DIAGNOSIS — I639 Cerebral infarction, unspecified: Secondary | ICD-10-CM

## 2015-03-19 DIAGNOSIS — Z954 Presence of other heart-valve replacement: Secondary | ICD-10-CM

## 2015-03-22 ENCOUNTER — Telehealth: Payer: Self-pay | Admitting: Cardiology

## 2015-03-22 ENCOUNTER — Telehealth: Payer: Self-pay | Admitting: Oncology

## 2015-03-22 NOTE — Telephone Encounter (Signed)
F/u TBA per 2/23 pof.

## 2015-03-22 NOTE — Telephone Encounter (Signed)
New message    Pt daughter is calling to see if a fax was received from PCP

## 2015-03-22 NOTE — Telephone Encounter (Signed)
Spoke to daughter , she wanted to know if received blood pressure readings from [employer]. RN  Informed daughter , the faxed information is awaiting for Dr Percival Spanish to review    Range from 105/58 to 160/90 ( 03/04/15 to 03/17/15)  Daughter verbalized understanding.

## 2015-03-23 NOTE — Telephone Encounter (Signed)
Scanned where?

## 2015-03-24 DIAGNOSIS — I639 Cerebral infarction, unspecified: Secondary | ICD-10-CM | POA: Diagnosis not present

## 2015-03-24 DIAGNOSIS — I10 Essential (primary) hypertension: Secondary | ICD-10-CM | POA: Diagnosis not present

## 2015-03-24 DIAGNOSIS — R489 Unspecified symbolic dysfunctions: Secondary | ICD-10-CM | POA: Diagnosis not present

## 2015-03-24 DIAGNOSIS — Z5189 Encounter for other specified aftercare: Secondary | ICD-10-CM | POA: Diagnosis not present

## 2015-03-24 DIAGNOSIS — R262 Difficulty in walking, not elsewhere classified: Secondary | ICD-10-CM | POA: Diagnosis not present

## 2015-03-24 DIAGNOSIS — R278 Other lack of coordination: Secondary | ICD-10-CM | POA: Diagnosis not present

## 2015-03-24 DIAGNOSIS — F4321 Adjustment disorder with depressed mood: Secondary | ICD-10-CM | POA: Diagnosis not present

## 2015-03-24 DIAGNOSIS — R1312 Dysphagia, oropharyngeal phase: Secondary | ICD-10-CM | POA: Diagnosis not present

## 2015-03-24 DIAGNOSIS — R279 Unspecified lack of coordination: Secondary | ICD-10-CM | POA: Diagnosis not present

## 2015-03-24 DIAGNOSIS — R2681 Unsteadiness on feet: Secondary | ICD-10-CM | POA: Diagnosis not present

## 2015-03-24 DIAGNOSIS — G3183 Dementia with Lewy bodies: Secondary | ICD-10-CM | POA: Diagnosis not present

## 2015-03-24 DIAGNOSIS — M6281 Muscle weakness (generalized): Secondary | ICD-10-CM | POA: Diagnosis not present

## 2015-03-24 DIAGNOSIS — E785 Hyperlipidemia, unspecified: Secondary | ICD-10-CM | POA: Diagnosis not present

## 2015-03-24 DIAGNOSIS — F0280 Dementia in other diseases classified elsewhere without behavioral disturbance: Secondary | ICD-10-CM | POA: Diagnosis not present

## 2015-03-24 DIAGNOSIS — Z9181 History of falling: Secondary | ICD-10-CM | POA: Diagnosis not present

## 2015-03-24 DIAGNOSIS — E46 Unspecified protein-calorie malnutrition: Secondary | ICD-10-CM | POA: Diagnosis not present

## 2015-03-24 DIAGNOSIS — R5381 Other malaise: Secondary | ICD-10-CM | POA: Diagnosis not present

## 2015-03-24 DIAGNOSIS — N390 Urinary tract infection, site not specified: Secondary | ICD-10-CM | POA: Diagnosis not present

## 2015-03-25 ENCOUNTER — Encounter: Payer: Self-pay | Admitting: Adult Health

## 2015-03-25 ENCOUNTER — Non-Acute Institutional Stay (SKILLED_NURSING_FACILITY): Payer: Medicare Other | Admitting: Adult Health

## 2015-03-25 DIAGNOSIS — E46 Unspecified protein-calorie malnutrition: Secondary | ICD-10-CM

## 2015-03-25 DIAGNOSIS — R5381 Other malaise: Secondary | ICD-10-CM

## 2015-03-25 DIAGNOSIS — G3183 Dementia with Lewy bodies: Secondary | ICD-10-CM

## 2015-03-25 DIAGNOSIS — I10 Essential (primary) hypertension: Secondary | ICD-10-CM

## 2015-03-25 DIAGNOSIS — I639 Cerebral infarction, unspecified: Secondary | ICD-10-CM | POA: Diagnosis not present

## 2015-03-25 DIAGNOSIS — F4321 Adjustment disorder with depressed mood: Secondary | ICD-10-CM | POA: Diagnosis not present

## 2015-03-25 DIAGNOSIS — E785 Hyperlipidemia, unspecified: Secondary | ICD-10-CM

## 2015-03-25 DIAGNOSIS — F0280 Dementia in other diseases classified elsewhere without behavioral disturbance: Secondary | ICD-10-CM | POA: Diagnosis not present

## 2015-03-25 NOTE — Progress Notes (Signed)
Patient ID: [name], female   DOB: 1919-10-22, 82 y.o.   MRN: [id number]    DATE:  03/25/2015   MRN:  [id number]  BIRTHDAY: [birth year]  Facility:  Nursing Home Location:  [place] Room Number: 208-P  LEVEL OF CARE:  SNF [phone])  Contact Information    Name Relation Home Work [place] Son [phone]     Otho Ket [phone]     Trotter,Carol Daughter [phone]         Code Status History    Date Active Date Inactive Code Status Order ID Comments User Context   02/26/2015  3:28 AM 03/02/2015  7:34 PM DNR [id number]  Rise Patience, MD Inpatient    Questions for Most Recent Historical Code Status (Order [id number])    Question Answer Comment   In the event of cardiac or respiratory ARREST Do not call a "code blue"    In the event of cardiac or respiratory ARREST Do not perform Intubation, CPR, defibrillation or ACLS    In the event of cardiac or respiratory ARREST Use medication by any route, position, wound care, and other measures to relive pain and suffering. May use oxygen, suction and manual treatment of airway obstruction as needed for comfort.        Chief Complaint  Patient presents with  . Medical Management of Chronic Issues    HISTORY OF PRESENT ILLNESS:  This is a 82 year old female who is being seen for a routine visit. Ipratropium 0.06% was recently discontinued per patient request.. She was recently started on Procel for protein-calorie malnutrition. Keflex course has been completed for UTI. No dysuria nor hematuria episode.    She has been admitted to [facility] on 03/02/15 from [facility]. She has PMH of HTN, CHF, AVR and CNS lymphoma in remission. She was hospitalized  with near syncope and generalized weakness. She had imaging of her head which showed right ACA infarct. She also had a UTI. She was seen by neurology and cardiology and continued on aspirin and lipitor. She was  treated with antibiotics for her UTI.  She has been admitted for a short-term rehabilitation.  PAST MEDICAL HISTORY:  Past Medical History  Diagnosis Date  . Nonischemic cardiomyopathy [facility]) 2007 / Feb 2009    Miminial non-obs CAD cath (2007); EF previously 25%;  55% by echo 2012;  Echo 4/14:  mild LVH; mod asymmetric hypertrophy of the septum, EF 55-60%, Gr 1 DD, AVR ok (mean gradient 9), MAC, mild LAE, PASP 31  . Aortic insufficiency October 2007    Severe with ascending thoracic aneurysm; s/p pericardial tissue AVR and Heamsheild graft in October 2007; c/b pericardial effusion requiring window;  echo 03/2010: inferobasal HK, severe LVH, EF 55-60%, dynamic obstruction during Valsalva in mid cavity, peak velocity 232 cm/sec, peak gradient 22 mmHg, AVR ok, mod dilated aorta, mild LAE  . Hyperlipidemia   . Hypertension   . GERD (gastroesophageal reflux disease)   . History of cholelithiasis     Gallbladder dysfunction  . History of non anemic vitamin B12 deficiency   . Allergic rhinitis   . TIA (transient ischemic attack) October 2010  . Brain cancer ([place])   . CNS lymphoma ([place])   . History of recent fall 09/2013+    > 2 falls year  . Physical deconditioning   . Acute CVA (cerebrovascular accident) ([place])   . UTI (lower urinary tract infection)   . Protein calorie  malnutrition ([place])   . Situational depression   . Cough   . Impaired renal function   . Lewy body dementia without behavioral disturbance      CURRENT MEDICATIONS: Reviewed  Patient's Medications  New Prescriptions   No medications on file  Previous Medications   ASPIRIN EC 81 MG EC TABLET    Take 1 tablet (81 mg total) by mouth daily.   ATORVASTATIN (LIPITOR) 20 MG TABLET    Take 1 tablet (20 mg total) by mouth daily at 6 PM.   CHOLECALCIFEROL (VITAMIN D-3 PO)    Take 1,000 Units by mouth daily.   CYANOCOBALAMIN (VITAMIN B-12) 1000 MCG SUBL    Place 1 tablet (1,000 mcg total) under the tongue daily.    DEXTROMETHORPHAN-GUAIFENESIN (MUCINEX DM) 30-600 MG 12HR TABLET    Take 1 tablet by mouth 2 (two) times daily.   FOLIC ACID (FOLVITE) 1 MG TABLET    Take 1 tablet (1 mg total) by mouth daily.   LISINOPRIL (PRINIVIL,ZESTRIL) 10 MG TABLET    Take 10 mg by mouth daily.   PROTEIN (PROCEL) POWD    Take 1 scoop by mouth 2 (two) times daily.  Modified Medications   No medications on file  Discontinued Medications   CEPHALEXIN (KEFLEX) 250 MG CAPSULE    Take 1 capsule (250 mg total) by mouth every 12 (twelve) hours.   CHOLECALCIFEROL (VITAMIN D) 1000 UNITS TABLET    Take 1 tablet (1,000 Units total) by mouth daily.   GUAIFENESIN (MUCINEX) 600 MG 12 HR TABLET    Take 600 mg by mouth 2 (two) times daily as needed for cough.   IPRATROPIUM (ATROVENT) 0.06 % NASAL SPRAY    Place 2 sprays into the nose 3 (three) times daily.     Allergies  Allergen Reactions  . Aricept [Donepezil Hcl] Nausea And Vomiting     REVIEW OF SYSTEMS:  GENERAL: no change in appetite, no fatigue, no weight changes, no fever, chills or weakness EYES: Denies change in vision, dry eyes, eye pain, itching or discharge EARS: Denies change in hearing, ringing in ears, or earache NOSE: Denies nasal congestion or epistaxis MOUTH and THROAT: Denies oral discomfort, gingival pain or bleeding, pain from teeth or hoarseness   RESPIRATORY: no cough, SOB, DOE, wheezing, hemoptysis CARDIAC: no chest pain, edema or palpitations GI: no abdominal pain, diarrhea, constipation, heart burn, nausea or vomiting GU: Denies dysuria, frequency, hematuria, incontinence, or discharge PSYCHIATRIC: Denies feeling of depression or anxiety. No report of hallucinations, insomnia, paranoia, or agitation    PHYSICAL EXAMINATION  GENERAL APPEARANCE: Well nourished. In no acute distress. Normal body habitus SKIN:  Skin is warm and dry. There are no suspicious lesions or rash HEAD: Normal in size and contour. No evidence of trauma EYES: Lids open and  close normally. No blepharitis, entropion or ectropion. PERRL. Conjunctivae are clear and sclerae are white. Lenses are without opacity EARS: Pinnae are normal. Patient hears normal voice tunes of the examiner MOUTH and THROAT: Lips are without lesions. Oral mucosa is moist and without lesions. Tongue is normal in shape, size, and color and without lesions NECK: supple, trachea midline, no neck masses, no thyroid tenderness, no thyromegaly LYMPHATICS: no LAN in the neck, no supraclavicular LAN RESPIRATORY: breathing is even & unlabored, BS CTAB CARDIAC: RRR, no murmur,no extra heart sounds, BLE edema 1 + GI: abdomen soft, normal BS, no masses, no tenderness, no hepatomegaly, no splenomegaly EXTREMITIES:  Able to move X 4 extremities PSYCHIATRIC: Alert and  oriented X 3. Affect and behavior are appropriate  LABS/RADIOLOGY: Labs reviewed: Basic Metabolic Panel:  Recent Labs  01/06/15 1224 02/25/15 2103 02/26/15 0540 03/08/15  NA 137 137 137 138  K 4.3 4.5 4.2 4.5  CL 100 102 102  --   CO2 28 24 25   --   GLUCOSE 100* 143* 112*  --   BUN 26* 30* 27* 27*  CREATININE 1.19 1.37* 1.12* 1.2*  CALCIUM 9.4 9.0 9.0  --    Liver Function Tests:  Recent Labs  01/06/15 1224 02/25/15 2103 02/26/15 0540 03/08/15  AST 15 17 16 15   ALT 7 10* 11* 10  ALKPHOS 84 81 78 86  BILITOT 1.1 0.6 0.8  --   PROT 6.8 6.1* 5.8*  --   ALBUMIN 4.1 3.6 3.2*  --    CBC:  Recent Labs  01/06/15 1224 02/25/15 2103 02/26/15 0540 03/08/15  WBC 5.5 7.4 5.1 5.5  NEUTROABS 3.7 5.4 3.0 3  HGB 13.3 12.8 12.3 12.7  HCT 39.7 37.8 36.0 38  MCV 101.3* 103.0* 100.6*  --   PLT 236.0 189 177 252   Lipid Panel:  Recent Labs  02/27/15 0536  HDL 40*   Cardiac Enzymes:  Recent Labs  01/06/15 1224 02/26/15 0540  CKMB 1.8  --   TROPONINI  --  0.03   CBG:  Recent Labs  02/26/15 0852 02/26/15 1630  GLUCAP 103* 98     Ct Angio Head W/cm &/or Wo Cm  03/01/2015  CLINICAL DATA:  Stroke. Weakness and  dizziness. Acute stroke in the right cingulate gyrus. EXAM: CT ANGIOGRAPHY HEAD TECHNIQUE: Multidetector CT imaging of the head was performed using the standard protocol during bolus administration of intravenous contrast. Multiplanar CT image reconstructions and MIPs were obtained to evaluate the vascular anatomy. CONTRAST:  75mL OMNIPAQUE IOHEXOL 350 MG/ML SOLN COMPARISON:  MRI 02/26/2015 FINDINGS: CT HEAD Brainstem appears unremarkable. There is an old left cerebellar infarction. Cerebral hemispheres show old infarction of the left temporal tip, old infarction in the right thalamus, and extensive chronic small vessel ischemic changes throughout the white matter. The acute cingulate gyrus infarction on the right cannot be specifically resolved. Colloid cysts measuring 7 mm is unchanged. Ventricular size is stable. No extra-axial collection. No blood products. CTA HEAD Anterior circulation: Both internal carotid arteries show aneurysmal dilatation beneath the skullbase, transverse diameter 7 mm on the left and the right. Both vessels are widely patent through the skullbase. There is atherosclerotic calcification in the siphon regions without stenosis estimated at no more than about 30%. Supra clinoid internal carotid artery shows circumferential calcification with stenosis estimated at 30%. Immediately beyond that, there is aneurysmal dilatation of the supra clinoid ICA on the left with diameter of 7 mm. Supra clinoid ICA on the right is ectatic with maximal diameter of 5 mm. The anterior and middle cerebral vessels are patent proximally. There is coarse calcification in the right M1 and M2 region. No stenosis greater than 30% is present. Right pericallosal artery is attenuated and occluded at about the level of the anterior body of the corpus callosum. Distal branch vessels do show diffuse atherosclerotic irregularity throughout. Posterior circulation: Both vertebral arteries are widely patent to the basilar. The  basilar shows atherosclerotic irregularity but no stenosis. Mild aneurysmal dilatation of the proximal basilar with a diameter up to 6 mm. Posterior circulation branch vessels are patent. Left PCA receives most of it supply from the anterior circulation. Venous sinuses: Patent and normal Anatomic variants: None  significant Delayed phase:No abnormal enhancement IMPRESSION: Right pericallosal branch of the anterior cerebral artery is occluded at the level of the anterior body of the corpus callosum. Advanced atherosclerotic narrowing and irregularity of the distal branch vessels diffusely. No correctable proximal stenosis or proximal occlusion. 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 Aneurysmal dilatation of the supra clinoid internal carotid arteries bilaterally, 7 mm on the left at 5 mm on the right. Aneurysmal dilatation of the proximal vertebral artery measuring 6 mm. Electronically Signed   By: Nelson Chimes M.D.   On: 03/01/2015 18:27   Dg Chest 2 View  02/25/2015  CLINICAL DATA:  Eating dinner and became weak. EXAM: CHEST  2 VIEW COMPARISON:  03/19/2014 FINDINGS: Previous median sternotomy and CABG procedure. Aortic atherosclerosis noted. Aneurysmal dilatation of the ascending thoracic aorta is again noted and appears similar in size to 2/20 2/16. No pleural effusion identified. No airspace consolidation or atelectasis. Degenerative disc disease is noted throughout the thoracic spine. IMPRESSION: No active cardiopulmonary disease. Similar appearance of ascending aortic aneurysm. Electronically Signed   By: Kerby Moors M.D.   On: 02/25/2015 21:32   Ct Head Wo Contrast  02/25/2015  CLINICAL DATA:  82 year old female with weakness. EXAM: CT HEAD WITHOUT CONTRAST TECHNIQUE: Contiguous axial images were obtained from the base of the skull through the vertex without intravenous contrast. COMPARISON:  Head CT 01/08/2015 FINDINGS: No  intracranial hemorrhage, mass effect, or midline shift. Left temporal craniotomy with subjacent encephalomalacia, stable. Colloid cyst measures 7 mm, unchanged in size in appearance from prior allowing for differences in caliper placement. Atrophy and chronic small vessel ischemia unchanged. Prior lacunar infarct in the left cerebellum and probable left periventricular frontal lobe, stable. No hydrocephalus. The basilar cisterns are patent. No evidence of territorial infarct. No intracranial fluid collection. Calvarium is intact. Included paranasal sinuses and mastoid air cells are well aerated. IMPRESSION: 1.  No acute intracranial abnormality. 2. Stable postsurgical and chronic change. Electronically Signed   By: Jeb Levering M.D.   On: 02/25/2015 23:14   Mr Brain Wo Contrast  02/26/2015  CLINICAL DATA:  Increased weakness. Dizziness. 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 EXAM: MRI HEAD WITHOUT CONTRAST TECHNIQUE: Multiplanar, multiecho pulse sequences of the brain and surrounding structures were obtained without intravenous contrast. COMPARISON:  Head CT 02/25/2015 and MRI 11/11/2013 FINDINGS: There is a 1 cm focus of restricted diffusion in the right cingulate gyrus consistent with acute ACA territory infarct. A few adjacent punctate foci of restricted diffusion are also noted more posteriorly. A small curvilinear focus of FLAIR hyperintensity in the interhemispheric fissure in this region may reflect slow flow or thrombosis of an ACA branch vessel responsible for this infarct. Multiple chronic microhemorrhages in both cerebellar hemispheres as well as a few scattered cerebral hemispheric microhemorrhages are unchanged from the prior MRI. Chronic infarcts are again seen in the right thalamus and left cerebellum. There are also chronic infarcts in the right cerebellum and left frontal white matter which are new from the prior MRI. Patchy to confluent T2  hyperintensities in the subcortical and deep cerebral white matter bilaterally have slightly progressed from the prior MRI and are nonspecific but compatible with moderate chronic small vessel ischemic disease. Sequelae of left temporal craniotomy are again identified with unchanged underlying left temporal lobe encephalomalacia. No mass is identified on this unenhanced study. There is no midline shift or extra-axial fluid collection. Prior bilateral cataract  extraction is noted. A right maxillary sinus mucous retention cyst is again seen. There is an unchanged, trace right mastoid effusion. Major intracranial vascular flow voids are preserved. IMPRESSION: 1. Small, acute right ACA territory infarct involving the cingulate gyrus. 2. Moderate chronic small vessel ischemic disease, progressed from 2015 with additional chronic infarcts as above. Electronically Signed   By: Logan Bores M.D.   On: 02/26/2015 08:59    ASSESSMENT/PLAN:   Physical deconditioning - continue rehabilitation  Acute CVA - continue rehabilitation; Continue aspirin 81 mg 1 tablet by mouth daily and Lipitor 20 mg 1 tablet every D at 6 PM   Protein calorie malnutrition - continue vitamin [id number] and folic acid supplementation and Procel one scoop by mouth twice a day  Depression - mood this is stable; for psychiatry consult  Hyperlipidemia - continue Lipitor 20 mg 1 tab by mouth every 6 p.m.  Hypertension - continue lisinopril 10 mg 1 tab by mouth daily  Dementia - stable; continue supportive care     Goals of care:  Short-term rehabilitation    [facility], [place]

## 2015-03-31 ENCOUNTER — Encounter: Payer: Self-pay | Admitting: Adult Health

## 2015-03-31 ENCOUNTER — Non-Acute Institutional Stay (SKILLED_NURSING_FACILITY): Payer: Medicare Other | Admitting: Adult Health

## 2015-03-31 DIAGNOSIS — N390 Urinary tract infection, site not specified: Secondary | ICD-10-CM

## 2015-03-31 NOTE — Progress Notes (Signed)
Patient ID: [name], female   DOB: [birth year], 82 y.o.   MRN: [id number]    DATE:  03/31/2015   MRN:  [id number]  BIRTHDAY: Jun 26, 1919  Facility:  Nursing Home Location:  [place] and [place] Room Number: 208-P  LEVEL OF CARE:  SNF [phone])  Contact Information    Name Relation Home Work [place] Son [phone]     Otho Ket [phone]     Trotter,Carol Daughter [phone]         Code Status History    Date Active Date Inactive Code Status Order ID Comments User Context   02/26/2015  3:28 AM 03/02/2015  7:34 PM DNR [id number]  Rise Patience, MD Inpatient    Questions for Most Recent Historical Code Status (Order [id number])    Question Answer Comment   In the event of cardiac or respiratory ARREST Do not call a "code blue"    In the event of cardiac or respiratory ARREST Do not perform Intubation, CPR, defibrillation or ACLS    In the event of cardiac or respiratory ARREST Use medication by any route, position, wound care, and other measures to relive pain and suffering. May use oxygen, suction and manual treatment of airway obstruction as needed for comfort.        Chief Complaint  Patient presents with  . Acute Visit    UTI    HISTORY OF PRESENT ILLNESS:  This is a 82 year old female whose urine culture result showing > 100,000 CFU/ml E coli. No hematuria has been noted. No complaints of dysuria nor reported fever. UA with CS done to follow-up recent UTI.   She has been admitted to [facility] on 03/02/15 from [facility]. She has PMH of HTN, CHF, AVR and CNS lymphoma in remission. She was hospitalized  with near syncope and generalized weakness. She had imaging of her head which showed right ACA infarct. She also had a UTI. She was seen by neurology and cardiology and continued on aspirin and lipitor. She was treated with antibiotics for her UTI.  She has been admitted for a short-term  rehabilitation.  PAST MEDICAL HISTORY:  Past Medical History  Diagnosis Date  . Nonischemic cardiomyopathy [facility]) 2007 / Feb 2009    Miminial non-obs CAD cath (2007); EF previously 25%;  55% by echo 2012;  Echo 4/14:  mild LVH; mod asymmetric hypertrophy of the septum, EF 55-60%, Gr 1 DD, AVR ok (mean gradient 9), MAC, mild LAE, PASP 31  . Aortic insufficiency October 2007    Severe with ascending thoracic aneurysm; s/p pericardial tissue AVR and Heamsheild graft in October 2007; c/b pericardial effusion requiring window;  echo 03/2010: inferobasal HK, severe LVH, EF 55-60%, dynamic obstruction during Valsalva in mid cavity, peak velocity 232 cm/sec, peak gradient 22 mmHg, AVR ok, mod dilated aorta, mild LAE  . Hyperlipidemia   . Hypertension   . GERD (gastroesophageal reflux disease)   . History of cholelithiasis     Gallbladder dysfunction  . History of non anemic vitamin B12 deficiency   . Allergic rhinitis   . TIA (transient ischemic attack) October 2010  . Brain cancer ([place])   . CNS lymphoma ([place])   . History of recent fall 09/2013+    > 2 falls year  . Physical deconditioning   . Acute CVA (cerebrovascular accident) ([place])   . UTI (lower urinary tract infection)   . Protein calorie malnutrition ([place])   . Situational depression   .  Cough   . Impaired renal function   . Lewy body dementia without behavioral disturbance      CURRENT MEDICATIONS: Reviewed  Patient's Medications  New Prescriptions   No medications on file  Previous Medications   ASPIRIN EC 81 MG EC TABLET    Take 1 tablet (81 mg total) by mouth daily.   ATORVASTATIN (LIPITOR) 20 MG TABLET    Take 1 tablet (20 mg total) by mouth daily at 6 PM.   CHOLECALCIFEROL (VITAMIN D-3 PO)    Take 1,000 Units by mouth daily.   CYANOCOBALAMIN (VITAMIN B-12) 1000 MCG SUBL    Place 1 tablet (1,000 mcg total) under the tongue daily.   DEXTROMETHORPHAN-GUAIFENESIN (MUCINEX DM) 30-600 MG 12HR TABLET    Take 1 tablet by mouth 2  (two) times daily.   FOLIC ACID (FOLVITE) 1 MG TABLET    Take 1 tablet (1 mg total) by mouth daily.   LISINOPRIL (PRINIVIL,ZESTRIL) 10 MG TABLET    Take 10 mg by mouth daily.   NITROFURANTOIN, MACROCRYSTAL-MONOHYDRATE, (MACROBID) 100 MG CAPSULE    Take 100 mg by mouth 2 (two) times daily. Take for seven (7) days   PROTEIN (PROCEL) POWD    Take 1 scoop by mouth 2 (two) times daily.   SACCHAROMYCES BOULARDII (FLORASTOR) 250 MG CAPSULE    Take 250 mg by mouth 2 (two) times daily. Take for ten (10) days  Modified Medications   No medications on file  Discontinued Medications   No medications on file     Allergies  Allergen Reactions  . Aricept [Donepezil Hcl] Nausea And Vomiting     REVIEW OF SYSTEMS:  GENERAL: no change in appetite, no fatigue, no weight changes, no fever, chills or weakness EYES: Denies change in vision, dry eyes, eye pain, itching or discharge EARS: Denies change in hearing, ringing in ears, or earache NOSE: Denies nasal congestion or epistaxis MOUTH and THROAT: Denies oral discomfort, gingival pain or bleeding, pain from teeth or hoarseness   RESPIRATORY: no cough, SOB, DOE, wheezing, hemoptysis CARDIAC: no chest pain, edema or palpitations GI: no abdominal pain, diarrhea, constipation, heart burn, nausea or vomiting GU: Denies dysuria, frequency, hematuria, incontinence, or discharge PSYCHIATRIC: Denies feeling of depression or anxiety. No report of hallucinations, insomnia, paranoia, or agitation    PHYSICAL EXAMINATION  GENERAL APPEARANCE: Well nourished. In no acute distress. Normal body habitus SKIN:  Skin is warm and dry. There are no suspicious lesions or rash HEAD: Normal in size and contour. No evidence of trauma EYES: Lids open and close normally. No blepharitis, entropion or ectropion. PERRL. Conjunctivae are clear and sclerae are white. Lenses are without opacity EARS: Pinnae are normal. Patient hears normal voice tunes of the examiner MOUTH and  THROAT: Lips are without lesions. Oral mucosa is moist and without lesions. Tongue is normal in shape, size, and color and without lesions NECK: supple, trachea midline, no neck masses, no thyroid tenderness, no thyromegaly LYMPHATICS: no LAN in the neck, no supraclavicular LAN RESPIRATORY: breathing is even & unlabored, BS CTAB CARDIAC: RRR, no murmur,no extra heart sounds, BLE edema 1 + GI: abdomen soft, normal BS, no masses, no tenderness, no hepatomegaly, no splenomegaly EXTREMITIES:  Able to move X 4 extremities PSYCHIATRIC: Alert and oriented X 3. Affect and behavior are appropriate  LABS/RADIOLOGY: Labs reviewed: Basic Metabolic Panel:  Recent Labs  01/06/15 1224 02/25/15 2103 02/26/15 0540 03/08/15  NA 137 137 137 138  K 4.3 4.5 4.2 4.5  CL 100 102  102  --   CO2 28 24 25   --   GLUCOSE 100* 143* 112*  --   BUN 26* 30* 27* 27*  CREATININE 1.19 1.37* 1.12* 1.2*  CALCIUM 9.4 9.0 9.0  --    Liver Function Tests:  Recent Labs  01/06/15 1224 02/25/15 2103 02/26/15 0540 03/08/15  AST 15 17 16 15   ALT 7 10* 11* 10  ALKPHOS 84 81 78 86  BILITOT 1.1 0.6 0.8  --   PROT 6.8 6.1* 5.8*  --   ALBUMIN 4.1 3.6 3.2*  --    CBC:  Recent Labs  01/06/15 1224 02/25/15 2103 02/26/15 0540 03/08/15  WBC 5.5 7.4 5.1 5.5  NEUTROABS 3.7 5.4 3.0 3  HGB 13.3 12.8 12.3 12.7  HCT 39.7 37.8 36.0 38  MCV 101.3* 103.0* 100.6*  --   PLT 236.0 189 177 252   Lipid Panel:  Recent Labs  02/27/15 0536  HDL 40*   Cardiac Enzymes:  Recent Labs  01/06/15 1224 02/26/15 0540  CKMB 1.8  --   TROPONINI  --  0.03   CBG:  Recent Labs  02/26/15 0852 02/26/15 1630  GLUCAP 103* 98      ASSESSMENT/PLAN:   UTI -  Start Macrobid 100 mg PO BID X 7 days and Florastor 250 mg 1 capsule PO BID X 10 days    MEDINA-VARGAS,Mckoy Bhakta, NP [employer] [phone]

## 2015-04-01 ENCOUNTER — Telehealth: Payer: Self-pay

## 2015-04-01 NOTE — Telephone Encounter (Signed)
Left detailed voice message on patients voicemail.   Dr Percival Spanish received her blood pressure readings from Dr Bubba Camp. Patient's blood pressure is "somewhat labile but basically okay." Dr Percival Spanish has made no changes.

## 2015-04-02 ENCOUNTER — Non-Acute Institutional Stay (SKILLED_NURSING_FACILITY): Payer: Medicare Other | Admitting: Adult Health

## 2015-04-02 ENCOUNTER — Encounter: Payer: Self-pay | Admitting: Adult Health

## 2015-04-02 DIAGNOSIS — E785 Hyperlipidemia, unspecified: Secondary | ICD-10-CM | POA: Diagnosis not present

## 2015-04-02 DIAGNOSIS — E46 Unspecified protein-calorie malnutrition: Secondary | ICD-10-CM

## 2015-04-02 DIAGNOSIS — I10 Essential (primary) hypertension: Secondary | ICD-10-CM

## 2015-04-02 DIAGNOSIS — F4321 Adjustment disorder with depressed mood: Secondary | ICD-10-CM

## 2015-04-02 DIAGNOSIS — I639 Cerebral infarction, unspecified: Secondary | ICD-10-CM | POA: Diagnosis not present

## 2015-04-02 DIAGNOSIS — G3183 Dementia with Lewy bodies: Secondary | ICD-10-CM

## 2015-04-02 DIAGNOSIS — F0280 Dementia in other diseases classified elsewhere without behavioral disturbance: Secondary | ICD-10-CM | POA: Diagnosis not present

## 2015-04-02 DIAGNOSIS — N390 Urinary tract infection, site not specified: Secondary | ICD-10-CM | POA: Diagnosis not present

## 2015-04-02 DIAGNOSIS — R5381 Other malaise: Secondary | ICD-10-CM | POA: Diagnosis not present

## 2015-04-02 NOTE — Progress Notes (Signed)
Patient ID: [name], female   DOB: Aug 18, 1919, 82 y.o.   MRN: [id number]    DATE:  04/02/2015   MRN:  [id number]  BIRTHDAY: January 31, 1919  Facility:  Nursing Home Location:  [place] and [place] Room Number: 208-P  LEVEL OF CARE:  SNF [phone])  Contact Information    Name Relation Home Work [place] Son [phone]     Otho Ket [phone]     Trotter,Carol Daughter [phone]         Code Status History    Date Active Date Inactive Code Status Order ID Comments User Context   02/26/2015  3:28 AM 03/02/2015  7:34 PM DNR [id number]  Rise Patience, MD Inpatient    Questions for Most Recent Historical Code Status (Order [id number])    Question Answer Comment   In the event of cardiac or respiratory ARREST Do not call a "code blue"    In the event of cardiac or respiratory ARREST Do not perform Intubation, CPR, defibrillation or ACLS    In the event of cardiac or respiratory ARREST Use medication by any route, position, wound care, and other measures to relive pain and suffering. May use oxygen, suction and manual treatment of airway obstruction as needed for comfort.        Chief Complaint  Patient presents with  . Discharge Note    HISTORY OF PRESENT ILLNESS:  This is a 82 year old female who is for discharge home with Home health PT, OT, CNA and Nursing. She was recently started on Macrobid for UTI due to urine culture >100,000 CFU/ml E. Coli.  She has been admitted to [facility] on 03/02/15 from [facility]. She has PMH of HTN, CHF, AVR and CNS lymphoma in remission. She was hospitalized  with near syncope and generalized weakness. She had imaging of her head which showed right ACA infarct. She also had a UTI. She was seen by neurology and cardiology and continued on aspirin and lipitor. She was treated with antibiotics for her UTI in the hospital.  Patient was admitted to this facility for short-term  rehabilitation after the patient's recent hospitalization.  Patient has completed SNF rehabilitation and therapy has cleared the patient for discharge.   PAST MEDICAL HISTORY:  Past Medical History  Diagnosis Date  . Nonischemic cardiomyopathy [facility]) 2007 / Feb 2009    Miminial non-obs CAD cath (2007); EF previously 25%;  55% by echo 2012;  Echo 4/14:  mild LVH; mod asymmetric hypertrophy of the septum, EF 55-60%, Gr 1 DD, AVR ok (mean gradient 9), MAC, mild LAE, PASP 31  . Aortic insufficiency October 2007    Severe with ascending thoracic aneurysm; s/p pericardial tissue AVR and Heamsheild graft in October 2007; c/b pericardial effusion requiring window;  echo 03/2010: inferobasal HK, severe LVH, EF 55-60%, dynamic obstruction during Valsalva in mid cavity, peak velocity 232 cm/sec, peak gradient 22 mmHg, AVR ok, mod dilated aorta, mild LAE  . Hyperlipidemia   . Hypertension   . GERD (gastroesophageal reflux disease)   . History of cholelithiasis     Gallbladder dysfunction  . History of non anemic vitamin B12 deficiency   . Allergic rhinitis   . TIA (transient ischemic attack) October 2010  . Brain cancer ([place])   . CNS lymphoma ([place])   . History of recent fall 09/2013+    > 2 falls year  . Physical deconditioning   . Acute CVA (cerebrovascular accident) ([place])   .  UTI (lower urinary tract infection)   . Protein calorie malnutrition ([place])   . Situational depression   . Cough   . Impaired renal function   . Lewy body dementia without behavioral disturbance      CURRENT MEDICATIONS: Reviewed  Patient's Medications  New Prescriptions   No medications on file  Previous Medications   ASPIRIN EC 81 MG EC TABLET    Take 1 tablet (81 mg total) by mouth daily.   ATORVASTATIN (LIPITOR) 20 MG TABLET    Take 1 tablet (20 mg total) by mouth daily at 6 PM.   CHOLECALCIFEROL (VITAMIN D-3 PO)    Take 1,000 Units by mouth daily.   CYANOCOBALAMIN (VITAMIN B-12) 1000 MCG SUBL    Place 1 tablet  (1,000 mcg total) under the tongue daily.   DEXTROMETHORPHAN-GUAIFENESIN (MUCINEX DM) 30-600 MG 12HR TABLET    Take 1 tablet by mouth 2 (two) times daily.   FOLIC ACID (FOLVITE) 1 MG TABLET    Take 1 tablet (1 mg total) by mouth daily.   LISINOPRIL (PRINIVIL,ZESTRIL) 10 MG TABLET    Take 10 mg by mouth daily.   NITROFURANTOIN, MACROCRYSTAL-MONOHYDRATE, (MACROBID) 100 MG CAPSULE    Take 100 mg by mouth 2 (two) times daily. Take for seven (7) days   PROTEIN (PROCEL) POWD    Take 1 scoop by mouth 2 (two) times daily.   SACCHAROMYCES BOULARDII (FLORASTOR) 250 MG CAPSULE    Take 250 mg by mouth 2 (two) times daily. Take for ten (10) days  Modified Medications   No medications on file  Discontinued Medications   No medications on file     Allergies  Allergen Reactions  . Aricept [Donepezil Hcl] Nausea And Vomiting     REVIEW OF SYSTEMS:  GENERAL: no change in appetite, no fatigue, no weight changes, no fever, chills or weakness EYES: Denies change in vision, dry eyes, eye pain, itching or discharge EARS: Denies change in hearing, ringing in ears, or earache NOSE: Denies nasal congestion or epistaxis MOUTH and THROAT: Denies oral discomfort, gingival pain or bleeding, pain from teeth or hoarseness   RESPIRATORY: no cough, SOB, DOE, wheezing, hemoptysis CARDIAC: no chest pain, edema or palpitations GI: no abdominal pain, diarrhea, constipation, heart burn, nausea or vomiting GU: Denies dysuria, frequency, hematuria, incontinence, or discharge PSYCHIATRIC: Denies feeling of depression or anxiety. No report of hallucinations, insomnia, paranoia, or agitation    PHYSICAL EXAMINATION  GENERAL APPEARANCE: Well nourished. In no acute distress. Normal body habitus SKIN:  Skin is warm and dry. There are no suspicious lesions or rash HEAD: Normal in size and contour. No evidence of trauma EYES: Lids open and close normally. No blepharitis, entropion or ectropion. PERRL. Conjunctivae are clear  and sclerae are white. Lenses are without opacity EARS: Pinnae are normal. Patient hears normal voice tunes of the examiner MOUTH and THROAT: Lips are without lesions. Oral mucosa is moist and without lesions. Tongue is normal in shape, size, and color and without lesions NECK: supple, trachea midline, no neck masses, no thyroid tenderness, no thyromegaly LYMPHATICS: no LAN in the neck, no supraclavicular LAN RESPIRATORY: breathing is even & unlabored, BS CTAB CARDIAC: RRR, no murmur,no extra heart sounds, BLE edema 1 + GI: abdomen soft, normal BS, no masses, no tenderness, no hepatomegaly, no splenomegaly EXTREMITIES:  Able to move X 4 extremities PSYCHIATRIC: Alert and oriented X 3. Affect and behavior are appropriate  LABS/RADIOLOGY: Labs reviewed: Basic Metabolic Panel:  Recent Labs  01/06/15 1224 02/25/15  2103 02/26/15 0540 03/08/15  NA 137 137 137 138  K 4.3 4.5 4.2 4.5  CL 100 102 102  --   CO2 28 24 25   --   GLUCOSE 100* 143* 112*  --   BUN 26* 30* 27* 27*  CREATININE 1.19 1.37* 1.12* 1.2*  CALCIUM 9.4 9.0 9.0  --    Liver Function Tests:  Recent Labs  01/06/15 1224 02/25/15 2103 02/26/15 0540 03/08/15  AST 15 17 16 15   ALT 7 10* 11* 10  ALKPHOS 84 81 78 86  BILITOT 1.1 0.6 0.8  --   PROT 6.8 6.1* 5.8*  --   ALBUMIN 4.1 3.6 3.2*  --    CBC:  Recent Labs  01/06/15 1224 02/25/15 2103 02/26/15 0540 03/08/15  WBC 5.5 7.4 5.1 5.5  NEUTROABS 3.7 5.4 3.0 3  HGB 13.3 12.8 12.3 12.7  HCT 39.7 37.8 36.0 38  MCV 101.3* 103.0* 100.6*  --   PLT 236.0 189 177 252   Lipid Panel:  Recent Labs  02/27/15 0536  HDL 40*   Cardiac Enzymes:  Recent Labs  01/06/15 1224 02/26/15 0540  CKMB 1.8  --   TROPONINI  --  0.03   CBG:  Recent Labs  02/26/15 0852 02/26/15 1630  GLUCAP 103* 98      ASSESSMENT/PLAN:   Physical deconditioning - for Home health PT, OT, CNA and Nursing  Acute CVA - for Home health therapy; Continue aspirin 81 mg 1 tablet by  mouth daily and Lipitor 20 mg 1 tablet every D at 6 PM   Protein calorie malnutrition - continue vitamin 123456 and folic acid supplementation and [place] 1 scoop by mouth twice a day  Depression - mood this is stable  Hyperlipidemia - continue Lipitor 20 mg 1 tab by mouth every 6 p.m.  Hypertension - continue lisinopril 10 mg 1 tab by mouth daily  Dementia - stable; continue supportive care  UTI -  continue Macrobid to complete 7 day course       I have filled out patient's discharge paperwork and written prescriptions.  Patient will receive home health PT, OT, Nursing and CNA.  Total discharge time: Less than 30 minutes  Discharge time involved coordination of the discharge process with Education officer, museum, nursing staff and therapy department. Medical justification for home health services verified.   [facility], NP [employer] [phone]

## 2015-04-07 DIAGNOSIS — F0280 Dementia in other diseases classified elsewhere without behavioral disturbance: Secondary | ICD-10-CM | POA: Diagnosis not present

## 2015-04-07 DIAGNOSIS — R2681 Unsteadiness on feet: Secondary | ICD-10-CM | POA: Diagnosis not present

## 2015-04-07 DIAGNOSIS — Z8673 Personal history of transient ischemic attack (TIA), and cerebral infarction without residual deficits: Secondary | ICD-10-CM | POA: Diagnosis not present

## 2015-04-07 DIAGNOSIS — R531 Weakness: Secondary | ICD-10-CM | POA: Diagnosis not present

## 2015-04-07 DIAGNOSIS — I130 Hypertensive heart and chronic kidney disease with heart failure and stage 1 through stage 4 chronic kidney disease, or unspecified chronic kidney disease: Secondary | ICD-10-CM | POA: Diagnosis not present

## 2015-04-07 DIAGNOSIS — G3183 Dementia with Lewy bodies: Secondary | ICD-10-CM | POA: Diagnosis not present

## 2015-04-08 ENCOUNTER — Telehealth: Payer: Self-pay | Admitting: *Deleted

## 2015-04-08 DIAGNOSIS — Z8673 Personal history of transient ischemic attack (TIA), and cerebral infarction without residual deficits: Secondary | ICD-10-CM | POA: Diagnosis not present

## 2015-04-08 DIAGNOSIS — R2681 Unsteadiness on feet: Secondary | ICD-10-CM | POA: Diagnosis not present

## 2015-04-08 DIAGNOSIS — F0280 Dementia in other diseases classified elsewhere without behavioral disturbance: Secondary | ICD-10-CM | POA: Diagnosis not present

## 2015-04-08 DIAGNOSIS — I130 Hypertensive heart and chronic kidney disease with heart failure and stage 1 through stage 4 chronic kidney disease, or unspecified chronic kidney disease: Secondary | ICD-10-CM | POA: Diagnosis not present

## 2015-04-08 DIAGNOSIS — R531 Weakness: Secondary | ICD-10-CM | POA: Diagnosis not present

## 2015-04-08 DIAGNOSIS — G3183 Dementia with Lewy bodies: Secondary | ICD-10-CM | POA: Diagnosis not present

## 2015-04-08 NOTE — Telephone Encounter (Signed)
Left msg on triage stating mom was just d/c from rehab, and while she was in there they was giving her Astorvastatin. Wanting to know if mom should continue taking...Johny Chess

## 2015-04-08 NOTE — Telephone Encounter (Signed)
Ok not to take Lipitor Thx

## 2015-04-09 ENCOUNTER — Other Ambulatory Visit (INDEPENDENT_AMBULATORY_CARE_PROVIDER_SITE_OTHER): Payer: Medicare Other

## 2015-04-09 ENCOUNTER — Encounter: Payer: Self-pay | Admitting: Internal Medicine

## 2015-04-09 ENCOUNTER — Ambulatory Visit (INDEPENDENT_AMBULATORY_CARE_PROVIDER_SITE_OTHER): Payer: Medicare Other | Admitting: Internal Medicine

## 2015-04-09 ENCOUNTER — Ambulatory Visit (INDEPENDENT_AMBULATORY_CARE_PROVIDER_SITE_OTHER)
Admission: RE | Admit: 2015-04-09 | Discharge: 2015-04-09 | Disposition: A | Discharge status: Home / Self Care | Payer: Medicare Other | Source: Ambulatory Visit | Attending: Internal Medicine | Admitting: Internal Medicine

## 2015-04-09 VITALS — BP 110/70 | HR 88 | Wt 131.0 lb

## 2015-04-09 DIAGNOSIS — I951 Orthostatic hypotension: Secondary | ICD-10-CM

## 2015-04-09 DIAGNOSIS — Y92009 Unspecified place in unspecified non-institutional (private) residence as the place of occurrence of the external cause: Principal | ICD-10-CM

## 2015-04-09 DIAGNOSIS — G3183 Dementia with Lewy bodies: Secondary | ICD-10-CM | POA: Diagnosis not present

## 2015-04-09 DIAGNOSIS — Y92099 Unspecified place in other non-institutional residence as the place of occurrence of the external cause: Secondary | ICD-10-CM | POA: Diagnosis not present

## 2015-04-09 DIAGNOSIS — R0789 Other chest pain: Secondary | ICD-10-CM

## 2015-04-09 DIAGNOSIS — R531 Weakness: Secondary | ICD-10-CM | POA: Diagnosis not present

## 2015-04-09 DIAGNOSIS — R0781 Pleurodynia: Secondary | ICD-10-CM

## 2015-04-09 DIAGNOSIS — W19XXXA Unspecified fall, initial encounter: Secondary | ICD-10-CM

## 2015-04-09 DIAGNOSIS — M25552 Pain in left hip: Secondary | ICD-10-CM | POA: Diagnosis not present

## 2015-04-09 DIAGNOSIS — S3993XA Unspecified injury of pelvis, initial encounter: Secondary | ICD-10-CM | POA: Diagnosis not present

## 2015-04-09 DIAGNOSIS — R5382 Chronic fatigue, unspecified: Secondary | ICD-10-CM

## 2015-04-09 DIAGNOSIS — M25559 Pain in unspecified hip: Secondary | ICD-10-CM | POA: Insufficient documentation

## 2015-04-09 DIAGNOSIS — I130 Hypertensive heart and chronic kidney disease with heart failure and stage 1 through stage 4 chronic kidney disease, or unspecified chronic kidney disease: Secondary | ICD-10-CM | POA: Diagnosis not present

## 2015-04-09 DIAGNOSIS — I639 Cerebral infarction, unspecified: Secondary | ICD-10-CM | POA: Diagnosis not present

## 2015-04-09 DIAGNOSIS — F0280 Dementia in other diseases classified elsewhere without behavioral disturbance: Secondary | ICD-10-CM | POA: Diagnosis not present

## 2015-04-09 DIAGNOSIS — Z8673 Personal history of transient ischemic attack (TIA), and cerebral infarction without residual deficits: Secondary | ICD-10-CM | POA: Diagnosis not present

## 2015-04-09 DIAGNOSIS — S299XXA Unspecified injury of thorax, initial encounter: Secondary | ICD-10-CM | POA: Diagnosis not present

## 2015-04-09 DIAGNOSIS — R2681 Unsteadiness on feet: Secondary | ICD-10-CM | POA: Diagnosis not present

## 2015-04-09 LAB — HEPATIC FUNCTION PANEL
ALBUMIN: 4 g/dL (ref 3.5–5.2)
ALT: 9 U/L (ref 0–35)
AST: 16 U/L (ref 0–37)
Alkaline Phosphatase: 91 U/L (ref 39–117)
Bilirubin, Direct: 0.2 mg/dL (ref 0.0–0.3)
TOTAL PROTEIN: 6.8 g/dL (ref 6.0–8.3)
Total Bilirubin: 0.9 mg/dL (ref 0.2–1.2)

## 2015-04-09 LAB — BASIC METABOLIC PANEL
BUN: 42 mg/dL — ABNORMAL HIGH (ref 6–23)
CHLORIDE: 103 meq/L (ref 96–112)
CO2: 27 meq/L (ref 19–32)
CREATININE: 1.25 mg/dL — AB (ref 0.40–1.20)
Calcium: 9.6 mg/dL (ref 8.4–10.5)
GFR: 42.42 mL/min — ABNORMAL LOW (ref >60.00)
Glucose, Bld: 89 mg/dL (ref 70–99)
POTASSIUM: 4.3 meq/L (ref 3.5–5.1)
Sodium: 139 mEq/L (ref 135–145)

## 2015-04-09 LAB — CBC WITH DIFFERENTIAL/PLATELET
BASOS PCT: 0.3 % (ref 0.0–3.0)
Basophils Absolute: 0 10*3/uL (ref 0.0–0.1)
EOS ABS: 0.3 10*3/uL (ref 0.0–0.7)
EOS PCT: 4.7 % (ref 0.0–5.0)
HEMATOCRIT: 37.7 % (ref 36.0–46.0)
HEMOGLOBIN: 12.7 g/dL (ref 12.0–15.0)
LYMPHS PCT: 11.3 % — AB (ref 12.0–46.0)
Lymphs Abs: 0.7 10*3/uL (ref 0.7–4.0)
MCHC: 33.6 g/dL (ref 30.0–36.0)
MCV: 101.9 fl — AB (ref 78.0–100.0)
MONOS PCT: 13.9 % — AB (ref 3.0–12.0)
Monocytes Absolute: 0.9 10*3/uL (ref 0.1–1.0)
NEUTROS ABS: 4.4 10*3/uL (ref 1.4–7.7)
Neutrophils Relative %: 69.8 % (ref 43.0–77.0)
PLATELETS: 216 10*3/uL (ref 150.0–400.0)
RBC: 3.7 Mil/uL — ABNORMAL LOW (ref 3.87–5.11)
RDW: 13.4 % (ref 11.5–15.5)
WBC: 6.3 10*3/uL (ref 4.0–10.5)

## 2015-04-09 LAB — TSH: TSH: 1.56 u[IU]/mL (ref 0.35–4.50)

## 2015-04-09 LAB — CORTISOL: CORTISOL PLASMA: 11.3 ug/dL

## 2015-04-09 NOTE — Assessment & Plan Note (Addendum)
Hold Ramipril  W/c Rx given

## 2015-04-09 NOTE — Assessment & Plan Note (Signed)
TSH Labs

## 2015-04-09 NOTE — Progress Notes (Signed)
Subjective:  Patient ID: Lenord Fellers, female    DOB: 1920/01/05  Age: 82 y.o. MRN: [id number]  CC: Fall and Abdominal Pain   HPI Velecia Kegler [place] presents for a fall last night. C/o L ribs pain Now on Ramipril S/p CVA 02/25/15 - was in Rehab  Outpatient Prescriptions Prior to Visit  Medication Sig Dispense Refill  . aspirin EC 81 MG EC tablet Take 1 tablet (81 mg total) by mouth daily. 30 tablet 0  . Cholecalciferol (VITAMIN D-3 PO) Take 1,000 Units by mouth daily.    . Cyanocobalamin (VITAMIN B-12) 1000 MCG SUBL Place 1 tablet (1,000 mcg total) under the tongue daily. [id number] tablet 3  . folic acid (FOLVITE) 1 MG tablet Take 1 tablet (1 mg total) by mouth daily. 90 tablet 3  . lisinopril (PRINIVIL,ZESTRIL) 10 MG tablet Take 10 mg by mouth daily.    . Protein (PROCEL) POWD Take 1 scoop by mouth 2 (two) times daily.    Marland Kitchen saccharomyces boulardii (FLORASTOR) 250 MG capsule Take 250 mg by mouth 2 (two) times daily. Take for ten (10) days    . dextromethorphan-guaiFENesin (MUCINEX DM) 30-600 MG 12hr tablet Take 1 tablet by mouth 2 (two) times daily. Reported on 04/09/2015    . nitrofurantoin, macrocrystal-monohydrate, (MACROBID) 100 MG capsule Take 100 mg by mouth 2 (two) times daily. Reported on 04/09/2015     No facility-administered medications prior to visit.    ROS Review of Systems  Objective:  BP 130/82 mmHg  Pulse 88  Wt 131 lb (59.421 kg)  SpO2 98%  BP Readings from Last 3 Encounters:  04/09/15 130/82  04/02/15 135/86  03/31/15 132/74    Wt Readings from Last 3 Encounters:  04/09/15 131 lb (59.421 kg)  04/02/15 127 lb (57.607 kg)  03/31/15 127 lb (57.607 kg)    Physical Exam L hip is tender Lab Results  Component Value Date   WBC 5.5 03/08/2015   HGB 12.7 03/08/2015   HCT 38 03/08/2015   PLT 252 03/08/2015   GLUCOSE 112* 02/26/2015   CHOL 163 02/27/2015   TRIG 105 02/27/2015   HDL 40* 02/27/2015   LDLDIRECT 130.1 04/01/2010   LDLCALC 102* 02/27/2015   ALT 10 03/08/2015   AST 15 03/08/2015   NA 138 03/08/2015   K 4.5 03/08/2015   CL 102 02/26/2015   CREATININE 1.2* 03/08/2015   BUN 27* 03/08/2015   CO2 25 02/26/2015   TSH 2.32 01/06/2015   INR 1.09 02/25/2015   HGBA1C 6.0* 02/26/2015    Dg Chest 2 View  02/25/2015  CLINICAL DATA:  Eating dinner and became weak. EXAM: CHEST  2 VIEW COMPARISON:  03/19/2014 FINDINGS: Previous median sternotomy and CABG procedure. Aortic atherosclerosis noted. Aneurysmal dilatation of the ascending thoracic aorta is again noted and appears similar in size to 2/20 2/16. No pleural effusion identified. No airspace consolidation or atelectasis. Degenerative disc disease is noted throughout the thoracic spine. IMPRESSION: No active cardiopulmonary disease. Similar appearance of ascending aortic aneurysm. Electronically Signed   By: Kerby Moors M.D.   On: 02/25/2015 21:32   Ct Head Wo Contrast  02/25/2015  CLINICAL DATA:  82 year old female with weakness. EXAM: CT HEAD WITHOUT CONTRAST TECHNIQUE: Contiguous axial images were obtained from the base of the skull through the vertex without intravenous contrast. COMPARISON:  Head CT 01/08/2015 FINDINGS: No intracranial hemorrhage, mass effect, or midline shift. Left temporal craniotomy with subjacent encephalomalacia, stable. Colloid cyst measures 7 mm, unchanged in size in  appearance from prior allowing for differences in caliper placement. Atrophy and chronic small vessel ischemia unchanged. Prior lacunar infarct in the left cerebellum and probable left periventricular frontal lobe, stable. No hydrocephalus. The basilar cisterns are patent. No evidence of territorial infarct. No intracranial fluid collection. Calvarium is intact. Included paranasal sinuses and mastoid air cells are well aerated. IMPRESSION: 1.  No acute intracranial abnormality. 2. Stable postsurgical and chronic change. Electronically Signed   By: Jeb Levering M.D.   On: 02/25/2015 23:14   Mr Brain  Wo Contrast  02/26/2015  CLINICAL DATA:  Increased weakness. Dizziness. 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 EXAM: MRI HEAD WITHOUT CONTRAST TECHNIQUE: Multiplanar, multiecho pulse sequences of the brain and surrounding structures were obtained without intravenous contrast. COMPARISON:  Head CT 02/25/2015 and MRI 11/11/2013 FINDINGS: There is a 1 cm focus of restricted diffusion in the right cingulate gyrus consistent with acute ACA territory infarct. A few adjacent punctate foci of restricted diffusion are also noted more posteriorly. A small curvilinear focus of FLAIR hyperintensity in the interhemispheric fissure in this region may reflect slow flow or thrombosis of an ACA branch vessel responsible for this infarct. Multiple chronic microhemorrhages in both cerebellar hemispheres as well as a few scattered cerebral hemispheric microhemorrhages are unchanged from the prior MRI. Chronic infarcts are again seen in the right thalamus and left cerebellum. There are also chronic infarcts in the right cerebellum and left frontal white matter which are new from the prior MRI. Patchy to confluent T2 hyperintensities in the subcortical and deep cerebral white matter bilaterally have slightly progressed from the prior MRI and are nonspecific but compatible with moderate chronic small vessel ischemic disease. Sequelae of left temporal craniotomy are again identified with unchanged underlying left temporal lobe encephalomalacia. No mass is identified on this unenhanced study. There is no midline shift or extra-axial fluid collection. Prior bilateral cataract extraction is noted. A right maxillary sinus mucous retention cyst is again seen. There is an unchanged, trace right mastoid effusion. Major intracranial vascular flow voids are preserved. IMPRESSION: 1. Small, acute right ACA territory infarct involving the cingulate gyrus. 2. Moderate chronic small vessel ischemic disease,  progressed from 2015 with additional chronic infarcts as above. Electronically Signed   By: Logan Bores M.D.   On: 02/26/2015 08:59    Assessment & Plan:   There are no diagnoses linked to this encounter. I have discontinued Ms. Stamps's nitrofurantoin (macrocrystal-monohydrate). I am also having her maintain her Vitamin 0000000, folic acid, aspirin, lisinopril, Cholecalciferol (VITAMIN D-3 PO), dextromethorphan-guaiFENesin, PROCEL, saccharomyces boulardii, MAGNESIUM PO, and loratadine-pseudoephedrine.  Meds ordered this encounter  Medications  . MAGNESIUM PO    Sig: Take by mouth daily.  Marland Kitchen loratadine-pseudoephedrine (CLARITIN-D 24-HOUR) 10-240 MG 24 hr tablet    Sig: Take 1 tablet by mouth daily.     Follow-up: No Follow-up on file.  Walker Kehr, MD

## 2015-04-09 NOTE — Telephone Encounter (Signed)
Called Michael no answer LMOM w/MD response...Johny Chess

## 2015-04-09 NOTE — Progress Notes (Signed)
Pre visit review using our clinic review tool, if applicable. No additional management support is needed unless otherwise documented below in the visit note. 

## 2015-04-09 NOTE — Assessment & Plan Note (Signed)
L X ray

## 2015-04-09 NOTE — Assessment & Plan Note (Signed)
Pelvis X ray

## 2015-04-12 DIAGNOSIS — G3183 Dementia with Lewy bodies: Secondary | ICD-10-CM | POA: Diagnosis not present

## 2015-04-12 DIAGNOSIS — I130 Hypertensive heart and chronic kidney disease with heart failure and stage 1 through stage 4 chronic kidney disease, or unspecified chronic kidney disease: Secondary | ICD-10-CM | POA: Diagnosis not present

## 2015-04-12 DIAGNOSIS — Z8673 Personal history of transient ischemic attack (TIA), and cerebral infarction without residual deficits: Secondary | ICD-10-CM | POA: Diagnosis not present

## 2015-04-12 DIAGNOSIS — R531 Weakness: Secondary | ICD-10-CM | POA: Diagnosis not present

## 2015-04-12 DIAGNOSIS — R2681 Unsteadiness on feet: Secondary | ICD-10-CM | POA: Diagnosis not present

## 2015-04-12 DIAGNOSIS — F0280 Dementia in other diseases classified elsewhere without behavioral disturbance: Secondary | ICD-10-CM | POA: Diagnosis not present

## 2015-04-14 DIAGNOSIS — R2681 Unsteadiness on feet: Secondary | ICD-10-CM | POA: Diagnosis not present

## 2015-04-14 DIAGNOSIS — F0280 Dementia in other diseases classified elsewhere without behavioral disturbance: Secondary | ICD-10-CM | POA: Diagnosis not present

## 2015-04-14 DIAGNOSIS — Z8673 Personal history of transient ischemic attack (TIA), and cerebral infarction without residual deficits: Secondary | ICD-10-CM | POA: Diagnosis not present

## 2015-04-14 DIAGNOSIS — G3183 Dementia with Lewy bodies: Secondary | ICD-10-CM | POA: Diagnosis not present

## 2015-04-14 DIAGNOSIS — I130 Hypertensive heart and chronic kidney disease with heart failure and stage 1 through stage 4 chronic kidney disease, or unspecified chronic kidney disease: Secondary | ICD-10-CM | POA: Diagnosis not present

## 2015-04-14 DIAGNOSIS — R531 Weakness: Secondary | ICD-10-CM | POA: Diagnosis not present

## 2015-04-15 ENCOUNTER — Other Ambulatory Visit: Payer: Medicare Other

## 2015-04-15 DIAGNOSIS — R531 Weakness: Secondary | ICD-10-CM | POA: Diagnosis not present

## 2015-04-15 DIAGNOSIS — R2681 Unsteadiness on feet: Secondary | ICD-10-CM | POA: Diagnosis not present

## 2015-04-15 DIAGNOSIS — F0280 Dementia in other diseases classified elsewhere without behavioral disturbance: Secondary | ICD-10-CM | POA: Diagnosis not present

## 2015-04-15 DIAGNOSIS — R8290 Unspecified abnormal findings in urine: Secondary | ICD-10-CM | POA: Diagnosis not present

## 2015-04-15 DIAGNOSIS — G3183 Dementia with Lewy bodies: Secondary | ICD-10-CM | POA: Diagnosis not present

## 2015-04-15 DIAGNOSIS — I130 Hypertensive heart and chronic kidney disease with heart failure and stage 1 through stage 4 chronic kidney disease, or unspecified chronic kidney disease: Secondary | ICD-10-CM | POA: Diagnosis not present

## 2015-04-15 DIAGNOSIS — Z8673 Personal history of transient ischemic attack (TIA), and cerebral infarction without residual deficits: Secondary | ICD-10-CM | POA: Diagnosis not present

## 2015-04-15 LAB — MICROALBUMIN / CREATININE URINE RATIO
Creatinine,U: 96 mg/dL
MICROALB/CREAT RATIO: 13.1 mg/g (ref 0.0–30.0)
Microalb, Ur: 12.6 mg/dL — ABNORMAL HIGH (ref 0.0–1.9)

## 2015-04-16 DIAGNOSIS — Z8673 Personal history of transient ischemic attack (TIA), and cerebral infarction without residual deficits: Secondary | ICD-10-CM | POA: Diagnosis not present

## 2015-04-16 DIAGNOSIS — R531 Weakness: Secondary | ICD-10-CM | POA: Diagnosis not present

## 2015-04-16 DIAGNOSIS — G3183 Dementia with Lewy bodies: Secondary | ICD-10-CM | POA: Diagnosis not present

## 2015-04-16 DIAGNOSIS — F0280 Dementia in other diseases classified elsewhere without behavioral disturbance: Secondary | ICD-10-CM | POA: Diagnosis not present

## 2015-04-16 DIAGNOSIS — I130 Hypertensive heart and chronic kidney disease with heart failure and stage 1 through stage 4 chronic kidney disease, or unspecified chronic kidney disease: Secondary | ICD-10-CM | POA: Diagnosis not present

## 2015-04-16 DIAGNOSIS — R2681 Unsteadiness on feet: Secondary | ICD-10-CM | POA: Diagnosis not present

## 2015-04-19 ENCOUNTER — Telehealth: Payer: Self-pay | Admitting: Internal Medicine

## 2015-04-19 DIAGNOSIS — F0280 Dementia in other diseases classified elsewhere without behavioral disturbance: Secondary | ICD-10-CM | POA: Diagnosis not present

## 2015-04-19 DIAGNOSIS — G3183 Dementia with Lewy bodies: Secondary | ICD-10-CM | POA: Diagnosis not present

## 2015-04-19 DIAGNOSIS — Z8673 Personal history of transient ischemic attack (TIA), and cerebral infarction without residual deficits: Secondary | ICD-10-CM | POA: Diagnosis not present

## 2015-04-19 DIAGNOSIS — R2681 Unsteadiness on feet: Secondary | ICD-10-CM | POA: Diagnosis not present

## 2015-04-19 DIAGNOSIS — I130 Hypertensive heart and chronic kidney disease with heart failure and stage 1 through stage 4 chronic kidney disease, or unspecified chronic kidney disease: Secondary | ICD-10-CM | POA: Diagnosis not present

## 2015-04-19 DIAGNOSIS — R531 Weakness: Secondary | ICD-10-CM | POA: Diagnosis not present

## 2015-04-19 NOTE — Telephone Encounter (Signed)
Daughter in law called in checking on form that she states is on Dr. Judeen Hammans desk.  States that she dropped it off last week and is needing back for travel. I informed her that we did not have her on the release form but we could call the patient with information.  Daughter in law states she knows everyone at the office and that she was going to come to the office to sit until she talked to Dr. Alain Marion.

## 2015-04-19 NOTE — Telephone Encounter (Signed)
Done. Thx.

## 2015-04-19 NOTE — Telephone Encounter (Signed)
Form and written rx completed and upfront. Copy sent to be scanned. Pt informed

## 2015-04-19 NOTE — Telephone Encounter (Signed)
[name] from [place] is requsting verbal orders for OT for 3 x 2 wks and 1 x 1wk  She can be reached at [phone]

## 2015-04-20 ENCOUNTER — Telehealth: Payer: Self-pay

## 2015-04-20 DIAGNOSIS — G3183 Dementia with Lewy bodies: Secondary | ICD-10-CM | POA: Diagnosis not present

## 2015-04-20 DIAGNOSIS — I130 Hypertensive heart and chronic kidney disease with heart failure and stage 1 through stage 4 chronic kidney disease, or unspecified chronic kidney disease: Secondary | ICD-10-CM | POA: Diagnosis not present

## 2015-04-20 DIAGNOSIS — F0280 Dementia in other diseases classified elsewhere without behavioral disturbance: Secondary | ICD-10-CM | POA: Diagnosis not present

## 2015-04-20 DIAGNOSIS — R2681 Unsteadiness on feet: Secondary | ICD-10-CM | POA: Diagnosis not present

## 2015-04-20 DIAGNOSIS — Z8673 Personal history of transient ischemic attack (TIA), and cerebral infarction without residual deficits: Secondary | ICD-10-CM | POA: Diagnosis not present

## 2015-04-20 DIAGNOSIS — R531 Weakness: Secondary | ICD-10-CM | POA: Diagnosis not present

## 2015-04-20 NOTE — Telephone Encounter (Signed)
Ok Thx 

## 2015-04-20 NOTE — Telephone Encounter (Signed)
Brittney called back in regards.  States she is needing ok because she is seeing patient later on today.

## 2015-04-20 NOTE — Telephone Encounter (Signed)
Home Health Cert/Plan of Care received (04/07/2015 - 06/05/2015) and placed on MD's desk for signature

## 2015-04-21 DIAGNOSIS — Z8673 Personal history of transient ischemic attack (TIA), and cerebral infarction without residual deficits: Secondary | ICD-10-CM | POA: Diagnosis not present

## 2015-04-21 DIAGNOSIS — G3183 Dementia with Lewy bodies: Secondary | ICD-10-CM | POA: Diagnosis not present

## 2015-04-21 DIAGNOSIS — I130 Hypertensive heart and chronic kidney disease with heart failure and stage 1 through stage 4 chronic kidney disease, or unspecified chronic kidney disease: Secondary | ICD-10-CM | POA: Diagnosis not present

## 2015-04-21 DIAGNOSIS — F0280 Dementia in other diseases classified elsewhere without behavioral disturbance: Secondary | ICD-10-CM | POA: Diagnosis not present

## 2015-04-21 DIAGNOSIS — R531 Weakness: Secondary | ICD-10-CM | POA: Diagnosis not present

## 2015-04-21 DIAGNOSIS — R2681 Unsteadiness on feet: Secondary | ICD-10-CM | POA: Diagnosis not present

## 2015-04-21 NOTE — Telephone Encounter (Signed)
[name] a detailed mess giving verbal orders for below.

## 2015-04-22 DIAGNOSIS — I130 Hypertensive heart and chronic kidney disease with heart failure and stage 1 through stage 4 chronic kidney disease, or unspecified chronic kidney disease: Secondary | ICD-10-CM | POA: Diagnosis not present

## 2015-04-22 DIAGNOSIS — R531 Weakness: Secondary | ICD-10-CM | POA: Diagnosis not present

## 2015-04-22 DIAGNOSIS — G3183 Dementia with Lewy bodies: Secondary | ICD-10-CM | POA: Diagnosis not present

## 2015-04-22 DIAGNOSIS — Z8673 Personal history of transient ischemic attack (TIA), and cerebral infarction without residual deficits: Secondary | ICD-10-CM | POA: Diagnosis not present

## 2015-04-22 DIAGNOSIS — F0280 Dementia in other diseases classified elsewhere without behavioral disturbance: Secondary | ICD-10-CM | POA: Diagnosis not present

## 2015-04-22 DIAGNOSIS — R2681 Unsteadiness on feet: Secondary | ICD-10-CM | POA: Diagnosis not present

## 2015-04-23 ENCOUNTER — Emergency Department (INDEPENDENT_AMBULATORY_CARE_PROVIDER_SITE_OTHER)
Admission: EM | Admit: 2015-04-23 | Discharge: 2015-04-23 | Disposition: A | Discharge status: Home / Self Care | Payer: Medicare Other | Source: Home / Self Care | Attending: Family Medicine | Admitting: Family Medicine

## 2015-04-23 ENCOUNTER — Telehealth: Payer: Self-pay | Admitting: Internal Medicine

## 2015-04-23 ENCOUNTER — Encounter (HOSPITAL_COMMUNITY): Payer: Self-pay | Admitting: *Deleted

## 2015-04-23 DIAGNOSIS — W19XXXD Unspecified fall, subsequent encounter: Secondary | ICD-10-CM | POA: Diagnosis not present

## 2015-04-23 DIAGNOSIS — Z8673 Personal history of transient ischemic attack (TIA), and cerebral infarction without residual deficits: Secondary | ICD-10-CM | POA: Diagnosis not present

## 2015-04-23 DIAGNOSIS — G3183 Dementia with Lewy bodies: Secondary | ICD-10-CM | POA: Diagnosis not present

## 2015-04-23 DIAGNOSIS — R2681 Unsteadiness on feet: Secondary | ICD-10-CM | POA: Diagnosis not present

## 2015-04-23 DIAGNOSIS — I130 Hypertensive heart and chronic kidney disease with heart failure and stage 1 through stage 4 chronic kidney disease, or unspecified chronic kidney disease: Secondary | ICD-10-CM | POA: Diagnosis not present

## 2015-04-23 DIAGNOSIS — I10 Essential (primary) hypertension: Secondary | ICD-10-CM | POA: Diagnosis not present

## 2015-04-23 DIAGNOSIS — R609 Edema, unspecified: Secondary | ICD-10-CM | POA: Diagnosis not present

## 2015-04-23 DIAGNOSIS — F0280 Dementia in other diseases classified elsewhere without behavioral disturbance: Secondary | ICD-10-CM | POA: Diagnosis not present

## 2015-04-23 DIAGNOSIS — R531 Weakness: Secondary | ICD-10-CM | POA: Diagnosis not present

## 2015-04-23 NOTE — ED Notes (Signed)
pT  REPORTS  SYMPTOMS  OF  ELEVATED  BLOOD  PRESSURE     -  HER DOCTOR    TOOK  HER  OFF HER  MEDS  SEV  WEEKS  AGO      AND   HER  BLOOD  PRESSURE HAS  BEEN UP  AND  DOWN         SHE  REPORTS  FEELING  WEAK  OTHERWISE      SHE  IS  AWAKE  AND  ALERT  AT  THIS  TIME

## 2015-04-23 NOTE — Discharge Instructions (Signed)
[name]'s pressure is too high today.  Please restart her Ramipril Please have her use the compression stockings daily and use the lasix sparingly If her blood pressure is ever below 120 and she develops symptoms of lightheadedness and weakness then stop the blood pressure medicaiton

## 2015-04-23 NOTE — ED Provider Notes (Signed)
CSN: [id number]     Arrival date & time 04/23/15  1919 History   First MD Initiated Contact with Patient 04/23/15 1948     Chief Complaint  Patient presents with  . Hypertension   (Consider location/radiation/quality/duration/timing/severity/associated sxs/prior Treatment) HPI Patient here today with her caregiver as well as her daughter. Complaining of elevated blood pressure readings. Multiplde cuff readings over past 4 days ranging SBP 120-189 and SBP 66-123. Most in the 160 range  Denies any symptoms No new complaints.  New complaint of fatigue  Stroke 7 wks ago Of note patient was seen on 04/09/2015 by PCP and was told to stop her Prempro due to low blood pressure and reported fall. At that time her blood pressure reading was 110/70.  Previous blood pressure readings noted below  BP 130/82 mmHg  Pulse 88  Wt 131 lb (59.421 kg)  SpO2 98%  BP Readings from Last 3 Encounters:  04/09/15 130/82  04/02/15 135/86  03/31/15 132/74    Wt Readings from Last 3 Encounters:  04/09/15 131 lb (59.421 kg)  04/02/15 127 lb (57.607 kg)  03/31/15 127 lb (57.607 kg)         Patient states that on the day that she fell she lost her balance and fell backwards striking her head. This witness by caregiver. There is no story to suggest orthostasis.  Addition patient takes Lasix for lower extremity edema. She is supposed use compression stockings but these rarely used at the facility where she stays.  Denies any active chest pain, shortness of breath, palpitations, nausea, vomiting, confusion above baseline, headache, neck stiffness, fevers  Past Medical History  Diagnosis Date  . Nonischemic cardiomyopathy [facility]) 2007 / Feb 2009    Miminial non-obs CAD cath (2007); EF previously 25%;  55% by echo 2012;  Echo 4/14:  mild LVH; mod asymmetric hypertrophy of the septum, EF 55-60%, Gr 1 DD, AVR ok (mean gradient 9), MAC, mild LAE, PASP 31  . Aortic insufficiency October 2007     Severe with ascending thoracic aneurysm; s/p pericardial tissue AVR and Heamsheild graft in October 2007; c/b pericardial effusion requiring window;  echo 03/2010: inferobasal HK, severe LVH, EF 55-60%, dynamic obstruction during Valsalva in mid cavity, peak velocity 232 cm/sec, peak gradient 22 mmHg, AVR ok, mod dilated aorta, mild LAE  . Hyperlipidemia   . Hypertension   . GERD (gastroesophageal reflux disease)   . History of cholelithiasis     Gallbladder dysfunction  . History of non anemic vitamin B12 deficiency   . Allergic rhinitis   . TIA (transient ischemic attack) October 2010  . Brain cancer ([place])   . CNS lymphoma ([place])   . History of recent fall 09/2013+    > 2 falls year  . Physical deconditioning   . Acute CVA (cerebrovascular accident) ([place])   . UTI (lower urinary tract infection)   . Protein calorie malnutrition ([place])   . Situational depression   . Cough   . Impaired renal function   . Lewy body dementia without behavioral disturbance    Past Surgical History  Procedure Laterality Date  . Tonsillectomy    . Aortic valve replacement  2007  . Bilateral vein stripping    . Left temporal tumor resected      B cell lymphoma   Family History  Problem Relation Age of Onset  . Stroke Father   . Arthritis Other   . Coronary artery disease Other     Female 1st degree relative <  50  . Diabetes Other   . Cerebral aneurysm Son    Social History  Substance Use Topics  . Smoking status: Never Smoker   . Smokeless tobacco: Never Used  . Alcohol Use: No   OB History    No data available     Review of Systems Per HPI with all other pertinent systems negative.   Allergies  Aricept  Home Medications   Prior to Admission medications   Medication Sig Start Date End Date Taking? Authorizing Provider  aspirin EC 81 MG EC tablet Take 1 tablet (81 mg total) by mouth daily. 03/02/15   Velvet Bathe, MD  Cholecalciferol (VITAMIN D-3 PO) Take 1,000 Units by mouth daily.     Historical Provider, MD  Cyanocobalamin (VITAMIN B-12) 1000 MCG SUBL Place 1 tablet (1,000 mcg total) under the tongue daily. 09/19/13   Aleksei Plotnikov V, MD  folic acid (FOLVITE) 1 MG tablet Take 1 tablet (1 mg total) by mouth daily. 09/29/14   Aleksei Plotnikov V, MD  MAGNESIUM PO Take by mouth daily.    Historical Provider, MD  Protein (PROCEL) POWD Take 1 scoop by mouth 2 (two) times daily.    Historical Provider, MD  saccharomyces boulardii (FLORASTOR) 250 MG capsule Take 250 mg by mouth 2 (two) times daily. Take for ten (10) days    Historical Provider, MD   Meds Ordered and Administered this Visit  Medications - No data to display  BP 147/90 mmHg  Pulse 84  Temp(Src) 98.1 F (36.7 C) (Oral)  Resp 16  SpO2 98% No data found.   Physical Exam Physical Exam  Constitutional: oriented to person, place, and time. appears well-developed and well-nourished. No distress.  HENT:  Patient able to stand unassisted. No reported dizziness when going from sitting to standing Head: Normocephalic and atraumatic.  Eyes: EOMI. PERRL.  Neck: Normal range of motion.  Cardiovascular: RRR, no m/r/g, trace -1+ lower extremity edema  Pulmonary/Chest: Effort normal and breath sounds normal. No respiratory distress.  Abdominal: Soft. Bowel sounds are normal. NonTTP, no distension.  Musculoskeletal: Normal range of motion. Non ttp, no effusion.  Neurological: alert and oriented to person, place, and time.  Skin: Skin is warm. No rash noted. non diaphoretic.  Psychiatric: normal mood and affect. behavior is normal. Judgment and thought content normal.   ED Course  Procedures (including critical care time)  Labs Review Labs Reviewed - No data to display  Imaging Review No results found.   Visual Acuity Review  Right Eye Distance:   Left Eye Distance:   Bilateral Distance:    Right Eye Near:   Left Eye Near:    Bilateral Near:         MDM   1. Dependent edema   2. Essential  hypertension   3. Fall, subsequent encounter    Patient's current blood pressure readings are too high for her age. Picture was for further stroke. Recommending patient restart her Remeron PERRL as it seems that the vast majority of her blood pressure readings were in the 130/70 range. Counseled family to monitor her pressure every morning when she wakes up and if her pressure is below 120 that she should hold her blood pressure medication. Additionally counseled family against using Lasix unless swelling gets significantly worse and is not managed well by compression stockings. Family endorses that they would prefer this method for manager dependent edema and will talk to the facility about doing so. Patient's fall appears to been mechanical in  nature given her recent stroke and physical deconditioning. Patient is continue with reformations by her PCP for physical therapy and other necessary interventions such as a possible home visit and improvement in her home situation to prevent items that she might trip over.    Waldemar Dickens, MD 04/23/15 [phone]

## 2015-04-23 NOTE — Telephone Encounter (Signed)
Brittney from [place] called to let you know pts is 169/90 today She can be reached at [phone]

## 2015-04-23 NOTE — Telephone Encounter (Signed)
Noted. Thx.

## 2015-04-26 DIAGNOSIS — G3183 Dementia with Lewy bodies: Secondary | ICD-10-CM | POA: Diagnosis not present

## 2015-04-26 DIAGNOSIS — F0280 Dementia in other diseases classified elsewhere without behavioral disturbance: Secondary | ICD-10-CM | POA: Diagnosis not present

## 2015-04-26 DIAGNOSIS — Z8673 Personal history of transient ischemic attack (TIA), and cerebral infarction without residual deficits: Secondary | ICD-10-CM | POA: Diagnosis not present

## 2015-04-26 DIAGNOSIS — R531 Weakness: Secondary | ICD-10-CM | POA: Diagnosis not present

## 2015-04-26 DIAGNOSIS — I130 Hypertensive heart and chronic kidney disease with heart failure and stage 1 through stage 4 chronic kidney disease, or unspecified chronic kidney disease: Secondary | ICD-10-CM | POA: Diagnosis not present

## 2015-04-26 DIAGNOSIS — R2681 Unsteadiness on feet: Secondary | ICD-10-CM | POA: Diagnosis not present

## 2015-04-27 DIAGNOSIS — Z8673 Personal history of transient ischemic attack (TIA), and cerebral infarction without residual deficits: Secondary | ICD-10-CM | POA: Diagnosis not present

## 2015-04-27 DIAGNOSIS — R2681 Unsteadiness on feet: Secondary | ICD-10-CM | POA: Diagnosis not present

## 2015-04-27 DIAGNOSIS — G3183 Dementia with Lewy bodies: Secondary | ICD-10-CM | POA: Diagnosis not present

## 2015-04-27 DIAGNOSIS — I130 Hypertensive heart and chronic kidney disease with heart failure and stage 1 through stage 4 chronic kidney disease, or unspecified chronic kidney disease: Secondary | ICD-10-CM | POA: Diagnosis not present

## 2015-04-27 DIAGNOSIS — F0280 Dementia in other diseases classified elsewhere without behavioral disturbance: Secondary | ICD-10-CM | POA: Diagnosis not present

## 2015-04-27 DIAGNOSIS — R531 Weakness: Secondary | ICD-10-CM | POA: Diagnosis not present

## 2015-04-28 DIAGNOSIS — R2681 Unsteadiness on feet: Secondary | ICD-10-CM | POA: Diagnosis not present

## 2015-04-28 DIAGNOSIS — Z8673 Personal history of transient ischemic attack (TIA), and cerebral infarction without residual deficits: Secondary | ICD-10-CM | POA: Diagnosis not present

## 2015-04-28 DIAGNOSIS — G3183 Dementia with Lewy bodies: Secondary | ICD-10-CM | POA: Diagnosis not present

## 2015-04-28 DIAGNOSIS — R531 Weakness: Secondary | ICD-10-CM | POA: Diagnosis not present

## 2015-04-28 DIAGNOSIS — I130 Hypertensive heart and chronic kidney disease with heart failure and stage 1 through stage 4 chronic kidney disease, or unspecified chronic kidney disease: Secondary | ICD-10-CM | POA: Diagnosis not present

## 2015-04-28 DIAGNOSIS — F0280 Dementia in other diseases classified elsewhere without behavioral disturbance: Secondary | ICD-10-CM | POA: Diagnosis not present

## 2015-04-29 ENCOUNTER — Ambulatory Visit (INDEPENDENT_AMBULATORY_CARE_PROVIDER_SITE_OTHER): Payer: Medicare Other | Admitting: Internal Medicine

## 2015-04-29 ENCOUNTER — Encounter: Payer: Self-pay | Admitting: Internal Medicine

## 2015-04-29 ENCOUNTER — Other Ambulatory Visit (INDEPENDENT_AMBULATORY_CARE_PROVIDER_SITE_OTHER): Payer: Medicare Other

## 2015-04-29 VITALS — BP 108/70 | HR 96 | Temp 97.6°F | Ht 64.0 in | Wt 130.8 lb

## 2015-04-29 DIAGNOSIS — R55 Syncope and collapse: Secondary | ICD-10-CM

## 2015-04-29 DIAGNOSIS — N390 Urinary tract infection, site not specified: Secondary | ICD-10-CM

## 2015-04-29 DIAGNOSIS — I10 Essential (primary) hypertension: Secondary | ICD-10-CM | POA: Diagnosis not present

## 2015-04-29 DIAGNOSIS — I951 Orthostatic hypotension: Secondary | ICD-10-CM | POA: Diagnosis not present

## 2015-04-29 DIAGNOSIS — I639 Cerebral infarction, unspecified: Secondary | ICD-10-CM

## 2015-04-29 LAB — URINALYSIS, ROUTINE W REFLEX MICROSCOPIC
Hgb urine dipstick: NEGATIVE
Nitrite: POSITIVE — AB
PH: 5 (ref 5.0–8.0)
SPECIFIC GRAVITY, URINE: 1.02 (ref 1.000–1.030)
Total Protein, Urine: 30 — AB
Urine Glucose: NEGATIVE
Urobilinogen, UA: 0.2 (ref 0.0–1.0)

## 2015-04-29 NOTE — Assessment & Plan Note (Addendum)
Worse - now is back on Ramipril low dose q am Hold Ramipril if your SBP (systolic blood pressure) is <100

## 2015-04-29 NOTE — Assessment & Plan Note (Addendum)
Hold Ramipril if your SBP (systolic blood pressure) is <100 Use knee high compressing socks if tolerated

## 2015-04-29 NOTE — Patient Instructions (Addendum)
Hold Ramipril if your SBP (systolic blood pressure) is <100 Use knee high compressing socks if tolerated

## 2015-04-29 NOTE — Assessment & Plan Note (Addendum)
No sx's at present UA

## 2015-04-29 NOTE — Progress Notes (Signed)
Subjective:  Patient ID: [name], female    DOB: 09/24/1919  Age: 82 y.o. MRN: [id number]  CC: Follow-up   HPI [name] presents for HTN - better on Altace at low dose. SBP 110-164 lately. F/u dementia - no change. F/u on falls - no relapse  Outpatient Prescriptions Prior to Visit  Medication Sig Dispense Refill  . aspirin EC 81 MG EC tablet Take 1 tablet (81 mg total) by mouth daily. 30 tablet 0  . Cholecalciferol (VITAMIN D-3 PO) Take 1,000 Units by mouth daily.    . Cyanocobalamin (VITAMIN B-12) 1000 MCG SUBL Place 1 tablet (1,000 mcg total) under the tongue daily. 123XX123 tablet 3  . folic acid (FOLVITE) 1 MG tablet Take 1 tablet (1 mg total) by mouth daily. 90 tablet 3  . MAGNESIUM PO Take by mouth daily.    . Protein (PROCEL) POWD Take 1 scoop by mouth 2 (two) times daily.    Marland Kitchen saccharomyces boulardii (FLORASTOR) 250 MG capsule Take 250 mg by mouth 2 (two) times daily. Take for ten (10) days     No facility-administered medications prior to visit.    ROS Review of Systems  Constitutional: Positive for fatigue. Negative for chills, activity change, appetite change and unexpected weight change.  HENT: Negative for congestion, mouth sores and sinus pressure.   Eyes: Negative for visual disturbance.  Respiratory: Negative for cough and chest tightness.   Cardiovascular: Positive for leg swelling.  Gastrointestinal: Negative for nausea and abdominal pain.  Genitourinary: Negative for frequency, difficulty urinating and vaginal pain.  Musculoskeletal: Negative for back pain and gait problem.  Skin: Negative for pallor and rash.  Neurological: Positive for dizziness, weakness and light-headedness. Negative for tremors, numbness and headaches.  Psychiatric/Behavioral: Positive for decreased concentration. Negative for confusion and sleep disturbance.    Objective:  BP 108/70 mmHg  Pulse 96  Temp(Src) 97.6 F (36.4 C) (Oral)  Ht 5\' 4"  (1.626 m)  Wt 130 lb 12 oz  (59.308 kg)  BMI 22.43 kg/m2  SpO2 96%  BP Readings from Last 3 Encounters:  04/29/15 108/70  04/23/15 147/90  04/09/15 110/70    Wt Readings from Last 3 Encounters:  04/29/15 130 lb 12 oz (59.308 kg)  04/09/15 131 lb (59.421 kg)  04/02/15 127 lb (57.607 kg)    Physical Exam  Constitutional: She appears well-developed. No distress.  HENT:  Head: Normocephalic.  Right Ear: External ear normal.  Left Ear: External ear normal.  Nose: Nose normal.  Mouth/Throat: Oropharynx is clear and moist.  Eyes: Conjunctivae are normal. Pupils are equal, round, and reactive to light. Right eye exhibits no discharge. Left eye exhibits no discharge.  Neck: Normal range of motion. Neck supple. No JVD present. No tracheal deviation present. No thyromegaly present.  Cardiovascular: Normal rate, regular rhythm and normal heart sounds.   Pulmonary/Chest: No stridor. No respiratory distress. She has no wheezes.  Abdominal: Soft. Bowel sounds are normal. She exhibits no distension and no mass. There is no tenderness. There is no rebound and no guarding.  Musculoskeletal: She exhibits edema. She exhibits no tenderness.  Lymphadenopathy:    She has no cervical adenopathy.  Neurological: She displays normal reflexes. No cranial nerve deficit. She exhibits normal muscle tone. Coordination abnormal.  Skin: No rash noted. No erythema.  Psychiatric: She has a normal mood and affect. Her behavior is normal. Judgment and thought content normal.  trace feet edema Walker Alert, cooperative   I spoke w/[name] on the  phone re: pt's move to [place]  Lab Results  Component Value Date   WBC 6.3 04/09/2015   HGB 12.7 04/09/2015   HCT 37.7 04/09/2015   PLT 216.0 04/09/2015   GLUCOSE 89 04/09/2015   CHOL 163 02/27/2015   TRIG 105 02/27/2015   HDL 40* 02/27/2015   LDLDIRECT 130.1 04/01/2010   LDLCALC 102* 02/27/2015   ALT 9 04/09/2015   AST 16 04/09/2015   NA 139 04/09/2015   K 4.3 04/09/2015   CL 103  04/09/2015   CREATININE 1.25* 04/09/2015   BUN 42* 04/09/2015   CO2 27 04/09/2015   TSH 1.56 04/09/2015   INR 1.09 02/25/2015   HGBA1C 6.0* 02/26/2015   MICROALBUR 12.6* 04/15/2015    No results found.  Assessment & Plan:   [name] was seen today for follow-up.  Diagnoses and all orders for this visit:  Essential hypertension  Orthostatic hypotension  Near syncope  Urinary tract infection without hematuria, site unspecified -     Urinalysis; Future  I have discontinued [name]'s PROCEL and saccharomyces boulardii. I am also having her maintain her Vitamin 0000000, folic acid, aspirin, Cholecalciferol (VITAMIN D-3 PO), MAGNESIUM PO, ramipril, and Sennosides (SENOKOT PO).  Meds ordered this encounter  Medications  . ramipril (ALTACE) 2.5 MG capsule    Sig: Take 2.5 mg by mouth daily.  . Sennosides (SENOKOT PO)    Sig: Take 1 tablet by mouth daily.     Follow-up: No Follow-up on file.  Walker Kehr, MD

## 2015-04-29 NOTE — Assessment & Plan Note (Signed)
No relapse 

## 2015-04-30 DIAGNOSIS — F0280 Dementia in other diseases classified elsewhere without behavioral disturbance: Secondary | ICD-10-CM | POA: Diagnosis not present

## 2015-04-30 DIAGNOSIS — Z8673 Personal history of transient ischemic attack (TIA), and cerebral infarction without residual deficits: Secondary | ICD-10-CM | POA: Diagnosis not present

## 2015-04-30 DIAGNOSIS — R2681 Unsteadiness on feet: Secondary | ICD-10-CM | POA: Diagnosis not present

## 2015-04-30 DIAGNOSIS — I130 Hypertensive heart and chronic kidney disease with heart failure and stage 1 through stage 4 chronic kidney disease, or unspecified chronic kidney disease: Secondary | ICD-10-CM | POA: Diagnosis not present

## 2015-04-30 DIAGNOSIS — G3183 Dementia with Lewy bodies: Secondary | ICD-10-CM | POA: Diagnosis not present

## 2015-04-30 DIAGNOSIS — R531 Weakness: Secondary | ICD-10-CM | POA: Diagnosis not present

## 2015-04-30 MED ORDER — CEFUROXIME AXETIL 250 MG PO TABS: 250.0000 mg | ORAL_TABLET | Freq: Two times a day (BID) | ORAL | Status: AC

## 2015-05-03 ENCOUNTER — Encounter: Payer: Self-pay | Admitting: Neurology

## 2015-05-03 ENCOUNTER — Ambulatory Visit: Payer: Medicare Other | Admitting: Neurology

## 2015-05-03 ENCOUNTER — Ambulatory Visit (INDEPENDENT_AMBULATORY_CARE_PROVIDER_SITE_OTHER): Payer: Medicare Other | Admitting: Neurology

## 2015-05-03 VITALS — BP 139/83 | HR 78 | Ht 64.0 in | Wt 134.2 lb

## 2015-05-03 DIAGNOSIS — R2681 Unsteadiness on feet: Secondary | ICD-10-CM | POA: Diagnosis not present

## 2015-05-03 DIAGNOSIS — I63131 Cerebral infarction due to embolism of right carotid artery: Secondary | ICD-10-CM

## 2015-05-03 DIAGNOSIS — I639 Cerebral infarction, unspecified: Secondary | ICD-10-CM | POA: Diagnosis not present

## 2015-05-03 DIAGNOSIS — I130 Hypertensive heart and chronic kidney disease with heart failure and stage 1 through stage 4 chronic kidney disease, or unspecified chronic kidney disease: Secondary | ICD-10-CM | POA: Diagnosis not present

## 2015-05-03 DIAGNOSIS — G3183 Dementia with Lewy bodies: Secondary | ICD-10-CM | POA: Diagnosis not present

## 2015-05-03 DIAGNOSIS — Z8673 Personal history of transient ischemic attack (TIA), and cerebral infarction without residual deficits: Secondary | ICD-10-CM | POA: Diagnosis not present

## 2015-05-03 DIAGNOSIS — F0280 Dementia in other diseases classified elsewhere without behavioral disturbance: Secondary | ICD-10-CM | POA: Diagnosis not present

## 2015-05-03 DIAGNOSIS — R531 Weakness: Secondary | ICD-10-CM | POA: Diagnosis not present

## 2015-05-03 NOTE — Progress Notes (Signed)
[facility] [address] [place]. [place] [address] [phone]       OFFICE FOLLOW-UP NOTE  [name] Date of Birth:  February 21, 1919 Medical Record Number:  [id number]   HPI: 82 year old lady seen for first office follow-up for hospital admission for stroke in February 2017 [name] is an 82 y.o. female history of CNS lymphoma status post surgery in remission, bioprosthetic aortic valve replacement and ASD repair, previous history of nonischemic cardiomyopathy improved last EF measured in September 2016 was 65-70%, was brought to the ER after patient was found to be weak. Patient was having dinner at a restaurant when patient was found to be weak and diaphoretic. As per the ER physician and nursing notes patient was hypotensive on arrival. Improved with fluids. Per friend, she was out eating dinner with a man friend and became "pale and diaphoretic. EMS was called and she was brought to ED. She had generalized weakness and improved with fluids." MRI was obtained and did show a right ACA stroke. ECG shows PACs but no Afib. OF note, she was recently diagnosed with UTI and placed on Cipro. Had only taken 2 pills.  Date last known well: 2.2.2017 Time last known well: Time: 20:00 tPA Given: No: out of window.. Carotid Doppler showed no significant extracranial stenosis. Transthoracic echo showed normal ejection fraction without cardiac source of embolism. There was discussion between patient and family and the stroke team about the utility of getting transesophageal echo and loop recorder but it was decided that patient was not a good long-term anticoagulation candidate due to fall risk and advanced age and hence this was not done. LDL cholesterol was slightly elevated at 102 mg percent and hemoglobin A1c was borderline at 6.0. Patient was started on aspirin and a statin. She is currently living at home but in the last 2 months she has had at least 6 follows one of  them was a major fall when she fell on the floor and had abrasion and bruising. She does have 24/7 care at home. Patient is trying to move to [place] to be with her doctor next week. Patient is currently undergoing physical and occupation therapy at home. She has made gradual improvement and is able to walk quite well. She is tolerating aspirin well without bleeding. Her blood pressure is well controlled and today it is 139/83 in our  office. ROS:   14 system review of systems is positive for fatigue, unexpected weight change, runny nose, eye itching, leg swelling, cold intolerance, constipation, frequent infections, incontinence of bladder, urgency, aching muscles, easy bruising, memory loss, confusion and all other systems negative   PMH:  Past Medical History  Diagnosis Date  . Nonischemic cardiomyopathy [facility]) 2007 / Feb 2009    Miminial non-obs CAD cath (2007); EF previously 25%;  55% by echo 2012;  Echo 4/14:  mild LVH; mod asymmetric hypertrophy of the septum, EF 55-60%, Gr 1 DD, AVR ok (mean gradient 9), MAC, mild LAE, PASP 31  . Aortic insufficiency October 2007    Severe with ascending thoracic aneurysm; s/p pericardial tissue AVR and Heamsheild graft in October 2007; c/b pericardial effusion requiring window;  echo 03/2010: inferobasal HK, severe LVH, EF 55-60%, dynamic obstruction during Valsalva in mid cavity, peak velocity 232 cm/sec, peak gradient 22 mmHg, AVR ok, mod dilated aorta, mild LAE  . Hyperlipidemia   . Hypertension   . GERD (gastroesophageal reflux disease)   . History of cholelithiasis     Gallbladder  dysfunction  . History of non anemic vitamin B12 deficiency   . Allergic rhinitis   . TIA (transient ischemic attack) October 2010  . Brain cancer ([place])   . CNS lymphoma ([place])   . History of recent fall 09/2013+    > 2 falls year  . Physical deconditioning   . Acute CVA (cerebrovascular accident) ([place])   . UTI (lower urinary tract infection)   . Protein calorie  malnutrition ([place])   . Situational depression   . Cough   . Impaired renal function   . Lewy body dementia without behavioral disturbance     Social History:  Social History   Social History  . Marital Status: Widowed    Spouse Name: N/A  . Number of Children: N/A  . Years of Education: N/A   Occupational History  . Not on file.   Social History Main Topics  . Smoking status: Never Smoker   . Smokeless tobacco: Never Used  . Alcohol Use: No  . Drug Use: No  . Sexual Activity: Not on file   Other Topics Concern  . Not on file   Social History Narrative   HSG   Married @ 1942, 66 years/ widowed 2007   Lives alone in [place]   She was a housewife   No h/o abuse   End-of-Life: No CPR; DNI; no prolonged tube feeding.  Provided signed "Out of Facility Order" and completed and signed MOST form.  These should accompany her to hospital, etc.  She should share her wished with the family (04/01/10)          Medications:   Current Outpatient Prescriptions on File Prior to Visit  Medication Sig Dispense Refill  . aspirin EC 81 MG EC tablet Take 1 tablet (81 mg total) by mouth daily. 30 tablet 0  . cefUROXime (CEFTIN) 250 MG tablet Take 1 tablet (250 mg total) by mouth 2 (two) times daily. 14 tablet 0  . Cholecalciferol (VITAMIN D-3 PO) Take 1,000 Units by mouth daily.    . Cyanocobalamin (VITAMIN B-12) 1000 MCG SUBL Place 1 tablet (1,000 mcg total) under the tongue daily. 123XX123 tablet 3  . folic acid (FOLVITE) 1 MG tablet Take 1 tablet (1 mg total) by mouth daily. 90 tablet 3  . MAGNESIUM PO Take by mouth daily.    . ramipril (ALTACE) 2.5 MG capsule Take 2.5 mg by mouth daily.     No current facility-administered medications on file prior to visit.    Allergies:   Allergies  Allergen Reactions  . Aricept [Donepezil Hcl] Nausea And Vomiting    Physical Exam General: well developed, well nourished, seated, in no evident distress Head: head normocephalic and atraumatic.    Neck: supple with no carotid or supraclavicular bruits Cardiovascular: regular rate and rhythm, no murmurs Musculoskeletal: no deformity Skin:  no rash/petichiae Vascular:  Normal pulses all extremities Filed Vitals:   05/03/15 1144  BP: 139/83  Pulse: 78   Neurologic Exam Mental Status: Awake and fully alert. Oriented to place and time. Recent and remote memory intact. Attention span, concentration and fund of knowledge appropriate. Mood and affect appropriate.  Cranial Nerves: Fundoscopic exam reveals sharp disc margins. Pupils equal, briskly reactive to light. Extraocular movements full without nystagmus. Visual fields full to confrontation. Hearing intact. Facial sensation intact. Face, tongue, palate moves normally and symmetrically.  Motor: Normal bulk and tone. Normal strength in all tested extremity muscles. Diminished fine finger movements on the left. Minimum weakness of left  grip. Orbits right over left upper extremity. Sensory.: intact to touch ,pinprick .position and vibratory sensation.  Coordination: Rapid alternating movements normal in all extremities. Finger-to-nose and heel-to-shin performed accurately bilaterally. Gait and Station: Arises from chair without difficulty. Stance is normal. Gait demonstrates normal stride length and balance . Unable to heel, toe and tandem walk without difficulty.  Reflexes: 1+ and symmetric. Toes downgoing.   NIHSS  0 Modified Rankin  2   ASSESSMENT: 13 year lady with right anterior cerebral artery embolic stroke in February 2017. Vascular risk factors of hypertension, age and sex only. Patient is a fall risk and due to advanced age is not a good long-term anti-coagulation candidate and embolic workup was curtailed    PLAN:  had a long d/w patient, son and daughter about her recent stroke, risk for recurrent stroke/TIAs, personally independently reviewed imaging studies and stroke evaluation results and answered questions.Continue  aspirin 81 mg daily  for secondary stroke prevention and maintain strict control of hypertension with blood pressure goal below 130/90, diabetes with hemoglobin A1c goal below 6.5% and lipids with LDL cholesterol goal below 70 mg/dL. I also advised the patient about fall risk prevention precautions and advised her to use a walker at all times and to be particularly careful when first getting up and turning and bending. The patient plans to move to [place] and I advised that she see a primary care physician in the area. No routine neurological follow-up is necessary unless the primary care physician there feels so.Greater than 50% of time during this 25 minute visit was spent on counseling,explanation of diagnosis, planning of further management, discussion with patient and family and coordination of care Antony Contras, MD   Note: This document was prepared with digital dictation and possible smart phrase technology. Any transcriptional errors that result from this process are unintentional

## 2015-05-03 NOTE — Patient Instructions (Signed)
I had a long d/w patient, son and daughter about her recent stroke, risk for recurrent stroke/TIAs, personally independently reviewed imaging studies and stroke evaluation results and answered questions.Continue aspirin 81 mg daily  for secondary stroke prevention and maintain strict control of hypertension with blood pressure goal below 130/90, diabetes with hemoglobin A1c goal below 6.5% and lipids with LDL cholesterol goal below 70 mg/dL. I also advised the patient about fall risk prevention precautions and advised her to use a walker at all times and to be particularly careful when first getting up and turning and bending. The patient plans to move to [place] and I advised that she see a primary care physician in the area. No routine neurological follow-up is necessary unless the primary care physician there feels so. Fall Prevention in the Home  Falls can cause injuries. They can happen to people of all ages. There are many things you can do to make your home safe and to help prevent falls.  WHAT CAN I DO ON THE OUTSIDE OF MY HOME?  Regularly fix the edges of walkways and driveways and fix any cracks.  Remove anything that might make you trip as you walk through a door, such as a raised step or threshold.  Trim any bushes or trees on the path to your home.  Use bright outdoor lighting.  Clear any walking paths of anything that might make someone trip, such as rocks or tools.  Regularly check to see if handrails are loose or broken. Make sure that both sides of any steps have handrails.  Any raised decks and porches should have guardrails on the edges.  Have any leaves, snow, or ice cleared regularly.  Use sand or salt on walking paths during winter.  Clean up any spills in your garage right away. This includes oil or grease spills. WHAT CAN I DO IN THE BATHROOM?   Use night lights.  Install grab bars by the toilet and in the tub and shower. Do not use towel bars as grab bars.  Use  non-skid mats or decals in the tub or shower.  If you need to sit down in the shower, use a plastic, non-slip stool.  Keep the floor dry. Clean up any water that spills on the floor as soon as it happens.  Remove soap buildup in the tub or shower regularly.  Attach bath mats securely with double-sided non-slip rug tape.  Do not have throw rugs and other things on the floor that can make you trip. WHAT CAN I DO IN THE BEDROOM?  Use night lights.  Make sure that you have a light by your bed that is easy to reach.  Do not use any sheets or blankets that are too big for your bed. They should not hang down onto the floor.  Have a firm chair that has side arms. You can use this for support while you get dressed.  Do not have throw rugs and other things on the floor that can make you trip. WHAT CAN I DO IN THE KITCHEN?  Clean up any spills right away.  Avoid walking on wet floors.  Keep items that you use a lot in easy-to-reach places.  If you need to reach something above you, use a strong step stool that has a grab bar.  Keep electrical cords out of the way.  Do not use floor polish or wax that makes floors slippery. If you must use wax, use non-skid floor wax.  Do not have  throw rugs and other things on the floor that can make you trip. WHAT CAN I DO WITH MY STAIRS?  Do not leave any items on the stairs.  Make sure that there are handrails on both sides of the stairs and use them. Fix handrails that are broken or loose. Make sure that handrails are as long as the stairways.  Check any carpeting to make sure that it is firmly attached to the stairs. Fix any carpet that is loose or worn.  Avoid having throw rugs at the top or bottom of the stairs. If you do have throw rugs, attach them to the floor with carpet tape.  Make sure that you have a light switch at the top of the stairs and the bottom of the stairs. If you do not have them, ask someone to add them for you. WHAT  ELSE CAN I DO TO HELP PREVENT FALLS?  Wear shoes that:  Do not have high heels.  Have rubber bottoms.  Are comfortable and fit you well.  Are closed at the toe. Do not wear sandals.  If you use a stepladder:  Make sure that it is fully opened. Do not climb a closed stepladder.  Make sure that both sides of the stepladder are locked into place.  Ask someone to hold it for you, if possible.  Clearly mark and make sure that you can see:  Any grab bars or handrails.  First and last steps.  Where the edge of each step is.  Use tools that help you move around (mobility aids) if they are needed. These include:  Canes.  Walkers.  Scooters.  Crutches.  Turn on the lights when you go into a dark area. Replace any light bulbs as soon as they burn out.  Set up your furniture so you have a clear path. Avoid moving your furniture around.  If any of your floors are uneven, fix them.  If there are any pets around you, be aware of where they are.  Review your medicines with your doctor. Some medicines can make you feel dizzy. This can increase your chance of falling. Ask your doctor what other things that you can do to help prevent falls.   This information is not intended to replace advice given to you by your health care provider. Make sure you discuss any questions you have with your health care provider.   Document Released: 11/05/2008 Document Revised: 05/26/2014 Document Reviewed: 02/13/2014 Elsevier Interactive Patient Education [employer].

## 2015-05-04 ENCOUNTER — Telehealth: Payer: Self-pay | Admitting: Internal Medicine

## 2015-05-04 MED ORDER — LORAZEPAM 1 MG PO TABS: 1.0000 mg | ORAL_TABLET | Freq: Three times a day (TID) | ORAL | Status: AC | PRN

## 2015-05-04 NOTE — Telephone Encounter (Signed)
Pls call in Lorazepam prn - see Rx Thx

## 2015-05-04 NOTE — Telephone Encounter (Signed)
Pt's daughter Arbie Cookey called in because they will be traveling to [place]. She would like to know if PCP could call her something in for anxiety to help with the flight ride. She would like a call back to discuss and confirm further.    Pharmacy: [facility], [place]: [phone] -Please leave voicemail if possible.

## 2015-05-05 DIAGNOSIS — R531 Weakness: Secondary | ICD-10-CM | POA: Diagnosis not present

## 2015-05-05 DIAGNOSIS — F0280 Dementia in other diseases classified elsewhere without behavioral disturbance: Secondary | ICD-10-CM | POA: Diagnosis not present

## 2015-05-05 DIAGNOSIS — G3183 Dementia with Lewy bodies: Secondary | ICD-10-CM | POA: Diagnosis not present

## 2015-05-05 DIAGNOSIS — I130 Hypertensive heart and chronic kidney disease with heart failure and stage 1 through stage 4 chronic kidney disease, or unspecified chronic kidney disease: Secondary | ICD-10-CM | POA: Diagnosis not present

## 2015-05-05 DIAGNOSIS — R2681 Unsteadiness on feet: Secondary | ICD-10-CM | POA: Diagnosis not present

## 2015-05-05 DIAGNOSIS — Z8673 Personal history of transient ischemic attack (TIA), and cerebral infarction without residual deficits: Secondary | ICD-10-CM | POA: Diagnosis not present

## 2015-05-05 NOTE — Progress Notes (Signed)
Rn fax office notes to Dr .Callie Fielding MD office in [place]. Pt is moving with her daughter and has an appt with the MD on 05/21/2015 as a new patient for primary care. Office notes were fax to [phone].Marland Kitchen Fax was receive and confirm.

## 2015-05-05 NOTE — Telephone Encounter (Signed)
Notified pt daughter Arbie Cookey) rx called into [facility]...Johny Chess

## 2015-05-11 DIAGNOSIS — F0390 Unspecified dementia without behavioral disturbance: Secondary | ICD-10-CM | POA: Diagnosis not present

## 2015-05-11 DIAGNOSIS — I712 Thoracic aortic aneurysm, without rupture: Secondary | ICD-10-CM | POA: Diagnosis not present

## 2015-05-11 DIAGNOSIS — C8330 Diffuse large B-cell lymphoma, unspecified site: Secondary | ICD-10-CM | POA: Diagnosis not present

## 2015-05-11 DIAGNOSIS — Z952 Presence of prosthetic heart valve: Secondary | ICD-10-CM | POA: Diagnosis not present

## 2015-05-11 DIAGNOSIS — J302 Other seasonal allergic rhinitis: Secondary | ICD-10-CM | POA: Diagnosis not present

## 2015-05-11 DIAGNOSIS — Z8673 Personal history of transient ischemic attack (TIA), and cerebral infarction without residual deficits: Secondary | ICD-10-CM | POA: Diagnosis not present

## 2015-05-11 DIAGNOSIS — N3000 Acute cystitis without hematuria: Secondary | ICD-10-CM | POA: Diagnosis not present

## 2015-05-25 ENCOUNTER — Telehealth: Payer: Self-pay | Admitting: Oncology

## 2015-05-25 DIAGNOSIS — I5022 Chronic systolic (congestive) heart failure: Secondary | ICD-10-CM | POA: Diagnosis not present

## 2015-05-25 DIAGNOSIS — E46 Unspecified protein-calorie malnutrition: Secondary | ICD-10-CM | POA: Diagnosis not present

## 2015-05-25 DIAGNOSIS — F0280 Dementia in other diseases classified elsewhere without behavioral disturbance: Secondary | ICD-10-CM | POA: Diagnosis not present

## 2015-05-25 DIAGNOSIS — G3183 Dementia with Lewy bodies: Secondary | ICD-10-CM | POA: Diagnosis not present

## 2015-05-25 DIAGNOSIS — I110 Hypertensive heart disease with heart failure: Secondary | ICD-10-CM | POA: Diagnosis not present

## 2015-05-25 DIAGNOSIS — M6281 Muscle weakness (generalized): Secondary | ICD-10-CM | POA: Diagnosis not present

## 2015-05-25 NOTE — Telephone Encounter (Signed)
Mailed pt medical records to [place], [place]

## 2015-05-26 DIAGNOSIS — I110 Hypertensive heart disease with heart failure: Secondary | ICD-10-CM | POA: Diagnosis not present

## 2015-05-26 DIAGNOSIS — G3183 Dementia with Lewy bodies: Secondary | ICD-10-CM | POA: Diagnosis not present

## 2015-05-26 DIAGNOSIS — F0280 Dementia in other diseases classified elsewhere without behavioral disturbance: Secondary | ICD-10-CM | POA: Diagnosis not present

## 2015-05-26 DIAGNOSIS — E46 Unspecified protein-calorie malnutrition: Secondary | ICD-10-CM | POA: Diagnosis not present

## 2015-05-26 DIAGNOSIS — M6281 Muscle weakness (generalized): Secondary | ICD-10-CM | POA: Diagnosis not present

## 2015-05-26 DIAGNOSIS — I5022 Chronic systolic (congestive) heart failure: Secondary | ICD-10-CM | POA: Diagnosis not present

## 2015-05-27 DIAGNOSIS — I5022 Chronic systolic (congestive) heart failure: Secondary | ICD-10-CM | POA: Diagnosis not present

## 2015-05-27 DIAGNOSIS — I110 Hypertensive heart disease with heart failure: Secondary | ICD-10-CM | POA: Diagnosis not present

## 2015-05-27 DIAGNOSIS — E46 Unspecified protein-calorie malnutrition: Secondary | ICD-10-CM | POA: Diagnosis not present

## 2015-05-27 DIAGNOSIS — G3183 Dementia with Lewy bodies: Secondary | ICD-10-CM | POA: Diagnosis not present

## 2015-05-27 DIAGNOSIS — F0280 Dementia in other diseases classified elsewhere without behavioral disturbance: Secondary | ICD-10-CM | POA: Diagnosis not present

## 2015-05-27 DIAGNOSIS — M6281 Muscle weakness (generalized): Secondary | ICD-10-CM | POA: Diagnosis not present

## 2015-05-28 DIAGNOSIS — F0280 Dementia in other diseases classified elsewhere without behavioral disturbance: Secondary | ICD-10-CM | POA: Diagnosis not present

## 2015-05-28 DIAGNOSIS — I5022 Chronic systolic (congestive) heart failure: Secondary | ICD-10-CM | POA: Diagnosis not present

## 2015-05-28 DIAGNOSIS — M6281 Muscle weakness (generalized): Secondary | ICD-10-CM | POA: Diagnosis not present

## 2015-05-28 DIAGNOSIS — I110 Hypertensive heart disease with heart failure: Secondary | ICD-10-CM | POA: Diagnosis not present

## 2015-05-28 DIAGNOSIS — E46 Unspecified protein-calorie malnutrition: Secondary | ICD-10-CM | POA: Diagnosis not present

## 2015-05-28 DIAGNOSIS — G3183 Dementia with Lewy bodies: Secondary | ICD-10-CM | POA: Diagnosis not present

## 2015-05-31 DIAGNOSIS — G3183 Dementia with Lewy bodies: Secondary | ICD-10-CM | POA: Diagnosis not present

## 2015-05-31 DIAGNOSIS — F0280 Dementia in other diseases classified elsewhere without behavioral disturbance: Secondary | ICD-10-CM | POA: Diagnosis not present

## 2015-05-31 DIAGNOSIS — I5022 Chronic systolic (congestive) heart failure: Secondary | ICD-10-CM | POA: Diagnosis not present

## 2015-05-31 DIAGNOSIS — M6281 Muscle weakness (generalized): Secondary | ICD-10-CM | POA: Diagnosis not present

## 2015-05-31 DIAGNOSIS — I110 Hypertensive heart disease with heart failure: Secondary | ICD-10-CM | POA: Diagnosis not present

## 2015-05-31 DIAGNOSIS — E46 Unspecified protein-calorie malnutrition: Secondary | ICD-10-CM | POA: Diagnosis not present

## 2015-06-01 DIAGNOSIS — N390 Urinary tract infection, site not specified: Secondary | ICD-10-CM | POA: Diagnosis not present

## 2015-06-02 DIAGNOSIS — F0280 Dementia in other diseases classified elsewhere without behavioral disturbance: Secondary | ICD-10-CM | POA: Diagnosis not present

## 2015-06-02 DIAGNOSIS — M6281 Muscle weakness (generalized): Secondary | ICD-10-CM | POA: Diagnosis not present

## 2015-06-02 DIAGNOSIS — G3183 Dementia with Lewy bodies: Secondary | ICD-10-CM | POA: Diagnosis not present

## 2015-06-02 DIAGNOSIS — I110 Hypertensive heart disease with heart failure: Secondary | ICD-10-CM | POA: Diagnosis not present

## 2015-06-02 DIAGNOSIS — I5022 Chronic systolic (congestive) heart failure: Secondary | ICD-10-CM | POA: Diagnosis not present

## 2015-06-02 DIAGNOSIS — E46 Unspecified protein-calorie malnutrition: Secondary | ICD-10-CM | POA: Diagnosis not present

## 2015-06-07 DIAGNOSIS — F0280 Dementia in other diseases classified elsewhere without behavioral disturbance: Secondary | ICD-10-CM | POA: Diagnosis not present

## 2015-06-07 DIAGNOSIS — E46 Unspecified protein-calorie malnutrition: Secondary | ICD-10-CM | POA: Diagnosis not present

## 2015-06-07 DIAGNOSIS — I110 Hypertensive heart disease with heart failure: Secondary | ICD-10-CM | POA: Diagnosis not present

## 2015-06-07 DIAGNOSIS — G3183 Dementia with Lewy bodies: Secondary | ICD-10-CM | POA: Diagnosis not present

## 2015-06-07 DIAGNOSIS — I5022 Chronic systolic (congestive) heart failure: Secondary | ICD-10-CM | POA: Diagnosis not present

## 2015-06-07 DIAGNOSIS — M6281 Muscle weakness (generalized): Secondary | ICD-10-CM | POA: Diagnosis not present

## 2015-06-08 DIAGNOSIS — M6281 Muscle weakness (generalized): Secondary | ICD-10-CM | POA: Diagnosis not present

## 2015-06-08 DIAGNOSIS — I110 Hypertensive heart disease with heart failure: Secondary | ICD-10-CM | POA: Diagnosis not present

## 2015-06-08 DIAGNOSIS — E46 Unspecified protein-calorie malnutrition: Secondary | ICD-10-CM | POA: Diagnosis not present

## 2015-06-08 DIAGNOSIS — G3183 Dementia with Lewy bodies: Secondary | ICD-10-CM | POA: Diagnosis not present

## 2015-06-08 DIAGNOSIS — F0280 Dementia in other diseases classified elsewhere without behavioral disturbance: Secondary | ICD-10-CM | POA: Diagnosis not present

## 2015-06-08 DIAGNOSIS — I5022 Chronic systolic (congestive) heart failure: Secondary | ICD-10-CM | POA: Diagnosis not present

## 2015-06-10 DIAGNOSIS — I5022 Chronic systolic (congestive) heart failure: Secondary | ICD-10-CM | POA: Diagnosis not present

## 2015-06-10 DIAGNOSIS — E46 Unspecified protein-calorie malnutrition: Secondary | ICD-10-CM | POA: Diagnosis not present

## 2015-06-10 DIAGNOSIS — G3183 Dementia with Lewy bodies: Secondary | ICD-10-CM | POA: Diagnosis not present

## 2015-06-10 DIAGNOSIS — F0280 Dementia in other diseases classified elsewhere without behavioral disturbance: Secondary | ICD-10-CM | POA: Diagnosis not present

## 2015-06-10 DIAGNOSIS — M6281 Muscle weakness (generalized): Secondary | ICD-10-CM | POA: Diagnosis not present

## 2015-06-10 DIAGNOSIS — I110 Hypertensive heart disease with heart failure: Secondary | ICD-10-CM | POA: Diagnosis not present

## 2015-06-11 DIAGNOSIS — G3183 Dementia with Lewy bodies: Secondary | ICD-10-CM | POA: Diagnosis not present

## 2015-06-11 DIAGNOSIS — E46 Unspecified protein-calorie malnutrition: Secondary | ICD-10-CM | POA: Diagnosis not present

## 2015-06-11 DIAGNOSIS — I110 Hypertensive heart disease with heart failure: Secondary | ICD-10-CM | POA: Diagnosis not present

## 2015-06-11 DIAGNOSIS — F0280 Dementia in other diseases classified elsewhere without behavioral disturbance: Secondary | ICD-10-CM | POA: Diagnosis not present

## 2015-06-11 DIAGNOSIS — I5022 Chronic systolic (congestive) heart failure: Secondary | ICD-10-CM | POA: Diagnosis not present

## 2015-06-11 DIAGNOSIS — M6281 Muscle weakness (generalized): Secondary | ICD-10-CM | POA: Diagnosis not present

## 2015-06-14 DIAGNOSIS — E46 Unspecified protein-calorie malnutrition: Secondary | ICD-10-CM | POA: Diagnosis not present

## 2015-06-14 DIAGNOSIS — M6281 Muscle weakness (generalized): Secondary | ICD-10-CM | POA: Diagnosis not present

## 2015-06-14 DIAGNOSIS — I5022 Chronic systolic (congestive) heart failure: Secondary | ICD-10-CM | POA: Diagnosis not present

## 2015-06-14 DIAGNOSIS — G3183 Dementia with Lewy bodies: Secondary | ICD-10-CM | POA: Diagnosis not present

## 2015-06-14 DIAGNOSIS — F0280 Dementia in other diseases classified elsewhere without behavioral disturbance: Secondary | ICD-10-CM | POA: Diagnosis not present

## 2015-06-14 DIAGNOSIS — I110 Hypertensive heart disease with heart failure: Secondary | ICD-10-CM | POA: Diagnosis not present

## 2015-06-16 DIAGNOSIS — I5022 Chronic systolic (congestive) heart failure: Secondary | ICD-10-CM | POA: Diagnosis not present

## 2015-06-16 DIAGNOSIS — M6281 Muscle weakness (generalized): Secondary | ICD-10-CM | POA: Diagnosis not present

## 2015-06-16 DIAGNOSIS — F0280 Dementia in other diseases classified elsewhere without behavioral disturbance: Secondary | ICD-10-CM | POA: Diagnosis not present

## 2015-06-16 DIAGNOSIS — G3183 Dementia with Lewy bodies: Secondary | ICD-10-CM | POA: Diagnosis not present

## 2015-06-16 DIAGNOSIS — E46 Unspecified protein-calorie malnutrition: Secondary | ICD-10-CM | POA: Diagnosis not present

## 2015-06-16 DIAGNOSIS — I110 Hypertensive heart disease with heart failure: Secondary | ICD-10-CM | POA: Diagnosis not present

## 2015-06-18 DIAGNOSIS — I5022 Chronic systolic (congestive) heart failure: Secondary | ICD-10-CM | POA: Diagnosis not present

## 2015-06-18 DIAGNOSIS — E46 Unspecified protein-calorie malnutrition: Secondary | ICD-10-CM | POA: Diagnosis not present

## 2015-06-18 DIAGNOSIS — M6281 Muscle weakness (generalized): Secondary | ICD-10-CM | POA: Diagnosis not present

## 2015-06-18 DIAGNOSIS — G3183 Dementia with Lewy bodies: Secondary | ICD-10-CM | POA: Diagnosis not present

## 2015-06-18 DIAGNOSIS — I110 Hypertensive heart disease with heart failure: Secondary | ICD-10-CM | POA: Diagnosis not present

## 2015-06-18 DIAGNOSIS — F0280 Dementia in other diseases classified elsewhere without behavioral disturbance: Secondary | ICD-10-CM | POA: Diagnosis not present

## 2015-06-22 DIAGNOSIS — G3183 Dementia with Lewy bodies: Secondary | ICD-10-CM | POA: Diagnosis not present

## 2015-06-22 DIAGNOSIS — I5022 Chronic systolic (congestive) heart failure: Secondary | ICD-10-CM | POA: Diagnosis not present

## 2015-06-22 DIAGNOSIS — E46 Unspecified protein-calorie malnutrition: Secondary | ICD-10-CM | POA: Diagnosis not present

## 2015-06-22 DIAGNOSIS — F0280 Dementia in other diseases classified elsewhere without behavioral disturbance: Secondary | ICD-10-CM | POA: Diagnosis not present

## 2015-06-22 DIAGNOSIS — I110 Hypertensive heart disease with heart failure: Secondary | ICD-10-CM | POA: Diagnosis not present

## 2015-06-22 DIAGNOSIS — M6281 Muscle weakness (generalized): Secondary | ICD-10-CM | POA: Diagnosis not present

## 2015-06-23 DIAGNOSIS — N390 Urinary tract infection, site not specified: Secondary | ICD-10-CM | POA: Diagnosis not present

## 2015-06-25 DIAGNOSIS — F0280 Dementia in other diseases classified elsewhere without behavioral disturbance: Secondary | ICD-10-CM | POA: Diagnosis not present

## 2015-06-25 DIAGNOSIS — I110 Hypertensive heart disease with heart failure: Secondary | ICD-10-CM | POA: Diagnosis not present

## 2015-06-25 DIAGNOSIS — E46 Unspecified protein-calorie malnutrition: Secondary | ICD-10-CM | POA: Diagnosis not present

## 2015-06-25 DIAGNOSIS — G3183 Dementia with Lewy bodies: Secondary | ICD-10-CM | POA: Diagnosis not present

## 2015-06-25 DIAGNOSIS — I5022 Chronic systolic (congestive) heart failure: Secondary | ICD-10-CM | POA: Diagnosis not present

## 2015-06-25 DIAGNOSIS — M6281 Muscle weakness (generalized): Secondary | ICD-10-CM | POA: Diagnosis not present

## 2015-06-30 DIAGNOSIS — F0280 Dementia in other diseases classified elsewhere without behavioral disturbance: Secondary | ICD-10-CM | POA: Diagnosis not present

## 2015-06-30 DIAGNOSIS — G3183 Dementia with Lewy bodies: Secondary | ICD-10-CM | POA: Diagnosis not present

## 2015-06-30 DIAGNOSIS — I5022 Chronic systolic (congestive) heart failure: Secondary | ICD-10-CM | POA: Diagnosis not present

## 2015-06-30 DIAGNOSIS — M6281 Muscle weakness (generalized): Secondary | ICD-10-CM | POA: Diagnosis not present

## 2015-06-30 DIAGNOSIS — E46 Unspecified protein-calorie malnutrition: Secondary | ICD-10-CM | POA: Diagnosis not present

## 2015-06-30 DIAGNOSIS — I110 Hypertensive heart disease with heart failure: Secondary | ICD-10-CM | POA: Diagnosis not present

## 2015-07-01 DIAGNOSIS — E46 Unspecified protein-calorie malnutrition: Secondary | ICD-10-CM | POA: Diagnosis not present

## 2015-07-01 DIAGNOSIS — G3183 Dementia with Lewy bodies: Secondary | ICD-10-CM | POA: Diagnosis not present

## 2015-07-01 DIAGNOSIS — I110 Hypertensive heart disease with heart failure: Secondary | ICD-10-CM | POA: Diagnosis not present

## 2015-07-01 DIAGNOSIS — M6281 Muscle weakness (generalized): Secondary | ICD-10-CM | POA: Diagnosis not present

## 2015-07-01 DIAGNOSIS — I5022 Chronic systolic (congestive) heart failure: Secondary | ICD-10-CM | POA: Diagnosis not present

## 2015-07-01 DIAGNOSIS — F0280 Dementia in other diseases classified elsewhere without behavioral disturbance: Secondary | ICD-10-CM | POA: Diagnosis not present

## 2015-07-08 DIAGNOSIS — I110 Hypertensive heart disease with heart failure: Secondary | ICD-10-CM | POA: Diagnosis not present

## 2015-07-08 DIAGNOSIS — M6281 Muscle weakness (generalized): Secondary | ICD-10-CM | POA: Diagnosis not present

## 2015-07-08 DIAGNOSIS — I5022 Chronic systolic (congestive) heart failure: Secondary | ICD-10-CM | POA: Diagnosis not present

## 2015-07-08 DIAGNOSIS — G3183 Dementia with Lewy bodies: Secondary | ICD-10-CM | POA: Diagnosis not present

## 2015-07-08 DIAGNOSIS — E46 Unspecified protein-calorie malnutrition: Secondary | ICD-10-CM | POA: Diagnosis not present

## 2015-07-08 DIAGNOSIS — F0280 Dementia in other diseases classified elsewhere without behavioral disturbance: Secondary | ICD-10-CM | POA: Diagnosis not present

## 2015-07-15 DIAGNOSIS — I5022 Chronic systolic (congestive) heart failure: Secondary | ICD-10-CM | POA: Diagnosis not present

## 2015-07-15 DIAGNOSIS — F0280 Dementia in other diseases classified elsewhere without behavioral disturbance: Secondary | ICD-10-CM | POA: Diagnosis not present

## 2015-07-15 DIAGNOSIS — E46 Unspecified protein-calorie malnutrition: Secondary | ICD-10-CM | POA: Diagnosis not present

## 2015-07-15 DIAGNOSIS — I110 Hypertensive heart disease with heart failure: Secondary | ICD-10-CM | POA: Diagnosis not present

## 2015-07-15 DIAGNOSIS — G3183 Dementia with Lewy bodies: Secondary | ICD-10-CM | POA: Diagnosis not present

## 2015-07-15 DIAGNOSIS — M6281 Muscle weakness (generalized): Secondary | ICD-10-CM | POA: Diagnosis not present

## 2015-07-16 DIAGNOSIS — M6281 Muscle weakness (generalized): Secondary | ICD-10-CM | POA: Diagnosis not present

## 2015-07-16 DIAGNOSIS — I5022 Chronic systolic (congestive) heart failure: Secondary | ICD-10-CM | POA: Diagnosis not present

## 2015-07-16 DIAGNOSIS — G3183 Dementia with Lewy bodies: Secondary | ICD-10-CM | POA: Diagnosis not present

## 2015-07-16 DIAGNOSIS — F0280 Dementia in other diseases classified elsewhere without behavioral disturbance: Secondary | ICD-10-CM | POA: Diagnosis not present

## 2015-07-16 DIAGNOSIS — I110 Hypertensive heart disease with heart failure: Secondary | ICD-10-CM | POA: Diagnosis not present

## 2015-07-16 DIAGNOSIS — E46 Unspecified protein-calorie malnutrition: Secondary | ICD-10-CM | POA: Diagnosis not present

## 2015-07-23 DIAGNOSIS — E46 Unspecified protein-calorie malnutrition: Secondary | ICD-10-CM | POA: Diagnosis not present

## 2015-07-23 DIAGNOSIS — F0280 Dementia in other diseases classified elsewhere without behavioral disturbance: Secondary | ICD-10-CM | POA: Diagnosis not present

## 2015-07-23 DIAGNOSIS — M6281 Muscle weakness (generalized): Secondary | ICD-10-CM | POA: Diagnosis not present

## 2015-07-23 DIAGNOSIS — I110 Hypertensive heart disease with heart failure: Secondary | ICD-10-CM | POA: Diagnosis not present

## 2015-07-23 DIAGNOSIS — I5022 Chronic systolic (congestive) heart failure: Secondary | ICD-10-CM | POA: Diagnosis not present

## 2015-07-23 DIAGNOSIS — G3183 Dementia with Lewy bodies: Secondary | ICD-10-CM | POA: Diagnosis not present

## 2015-07-24 DIAGNOSIS — I5022 Chronic systolic (congestive) heart failure: Secondary | ICD-10-CM | POA: Diagnosis not present

## 2015-07-24 DIAGNOSIS — I110 Hypertensive heart disease with heart failure: Secondary | ICD-10-CM | POA: Diagnosis not present

## 2015-07-24 DIAGNOSIS — N289 Disorder of kidney and ureter, unspecified: Secondary | ICD-10-CM | POA: Diagnosis not present

## 2015-07-24 DIAGNOSIS — F0280 Dementia in other diseases classified elsewhere without behavioral disturbance: Secondary | ICD-10-CM | POA: Diagnosis not present

## 2015-07-24 DIAGNOSIS — G3183 Dementia with Lewy bodies: Secondary | ICD-10-CM | POA: Diagnosis not present

## 2015-07-24 DIAGNOSIS — M6281 Muscle weakness (generalized): Secondary | ICD-10-CM | POA: Diagnosis not present

## 2015-08-20 DIAGNOSIS — I110 Hypertensive heart disease with heart failure: Secondary | ICD-10-CM | POA: Diagnosis not present

## 2015-08-20 DIAGNOSIS — G3183 Dementia with Lewy bodies: Secondary | ICD-10-CM | POA: Diagnosis not present

## 2015-08-20 DIAGNOSIS — M6281 Muscle weakness (generalized): Secondary | ICD-10-CM | POA: Diagnosis not present

## 2015-08-20 DIAGNOSIS — F0280 Dementia in other diseases classified elsewhere without behavioral disturbance: Secondary | ICD-10-CM | POA: Diagnosis not present

## 2015-08-20 DIAGNOSIS — I5022 Chronic systolic (congestive) heart failure: Secondary | ICD-10-CM | POA: Diagnosis not present

## 2015-08-20 DIAGNOSIS — N289 Disorder of kidney and ureter, unspecified: Secondary | ICD-10-CM | POA: Diagnosis not present

## 2015-08-24 DIAGNOSIS — L821 Other seborrheic keratosis: Secondary | ICD-10-CM | POA: Diagnosis not present

## 2015-08-24 DIAGNOSIS — L309 Dermatitis, unspecified: Secondary | ICD-10-CM | POA: Diagnosis not present

## 2015-08-24 DIAGNOSIS — L2089 Other atopic dermatitis: Secondary | ICD-10-CM | POA: Diagnosis not present

## 2015-08-24 DIAGNOSIS — L570 Actinic keratosis: Secondary | ICD-10-CM | POA: Diagnosis not present

## 2015-08-27 DIAGNOSIS — I110 Hypertensive heart disease with heart failure: Secondary | ICD-10-CM | POA: Diagnosis not present

## 2015-08-27 DIAGNOSIS — N289 Disorder of kidney and ureter, unspecified: Secondary | ICD-10-CM | POA: Diagnosis not present

## 2015-08-27 DIAGNOSIS — I5022 Chronic systolic (congestive) heart failure: Secondary | ICD-10-CM | POA: Diagnosis not present

## 2015-08-27 DIAGNOSIS — F0280 Dementia in other diseases classified elsewhere without behavioral disturbance: Secondary | ICD-10-CM | POA: Diagnosis not present

## 2015-08-27 DIAGNOSIS — G3183 Dementia with Lewy bodies: Secondary | ICD-10-CM | POA: Diagnosis not present

## 2015-08-27 DIAGNOSIS — M6281 Muscle weakness (generalized): Secondary | ICD-10-CM | POA: Diagnosis not present

## 2015-09-11 DIAGNOSIS — M6281 Muscle weakness (generalized): Secondary | ICD-10-CM | POA: Diagnosis not present

## 2015-09-11 DIAGNOSIS — I5022 Chronic systolic (congestive) heart failure: Secondary | ICD-10-CM | POA: Diagnosis not present

## 2015-09-11 DIAGNOSIS — I110 Hypertensive heart disease with heart failure: Secondary | ICD-10-CM | POA: Diagnosis not present

## 2015-09-11 DIAGNOSIS — N289 Disorder of kidney and ureter, unspecified: Secondary | ICD-10-CM | POA: Diagnosis not present

## 2015-09-11 DIAGNOSIS — F0280 Dementia in other diseases classified elsewhere without behavioral disturbance: Secondary | ICD-10-CM | POA: Diagnosis not present

## 2015-09-11 DIAGNOSIS — G3183 Dementia with Lewy bodies: Secondary | ICD-10-CM | POA: Diagnosis not present

## 2015-09-15 DIAGNOSIS — M6281 Muscle weakness (generalized): Secondary | ICD-10-CM | POA: Diagnosis not present

## 2015-09-15 DIAGNOSIS — I110 Hypertensive heart disease with heart failure: Secondary | ICD-10-CM | POA: Diagnosis not present

## 2015-09-15 DIAGNOSIS — F0280 Dementia in other diseases classified elsewhere without behavioral disturbance: Secondary | ICD-10-CM | POA: Diagnosis not present

## 2015-09-15 DIAGNOSIS — I5022 Chronic systolic (congestive) heart failure: Secondary | ICD-10-CM | POA: Diagnosis not present

## 2015-09-15 DIAGNOSIS — N289 Disorder of kidney and ureter, unspecified: Secondary | ICD-10-CM | POA: Diagnosis not present

## 2015-09-15 DIAGNOSIS — G3183 Dementia with Lewy bodies: Secondary | ICD-10-CM | POA: Diagnosis not present

## 2015-10-02 ENCOUNTER — Other Ambulatory Visit: Payer: Self-pay | Admitting: Internal Medicine

## 2015-10-26 DIAGNOSIS — I110 Hypertensive heart disease with heart failure: Secondary | ICD-10-CM | POA: Diagnosis not present

## 2015-11-05 DIAGNOSIS — S8001XA Contusion of right knee, initial encounter: Secondary | ICD-10-CM | POA: Diagnosis not present

## 2015-11-05 DIAGNOSIS — S0181XA Laceration without foreign body of other part of head, initial encounter: Secondary | ICD-10-CM | POA: Diagnosis not present

## 2015-11-05 DIAGNOSIS — R350 Frequency of micturition: Secondary | ICD-10-CM | POA: Diagnosis not present

## 2015-11-05 DIAGNOSIS — W19XXXA Unspecified fall, initial encounter: Secondary | ICD-10-CM | POA: Diagnosis not present

## 2015-11-05 DIAGNOSIS — S0033XA Contusion of nose, initial encounter: Secondary | ICD-10-CM | POA: Diagnosis not present

## 2015-11-12 DIAGNOSIS — R350 Frequency of micturition: Secondary | ICD-10-CM | POA: Diagnosis not present

## 2015-11-27 DIAGNOSIS — Z23 Encounter for immunization: Secondary | ICD-10-CM | POA: Diagnosis not present

## 2015-12-10 DIAGNOSIS — I700 Atherosclerosis of aorta: Secondary | ICD-10-CM | POA: Diagnosis not present

## 2015-12-10 DIAGNOSIS — R4182 Altered mental status, unspecified: Secondary | ICD-10-CM | POA: Diagnosis not present

## 2015-12-10 DIAGNOSIS — R531 Weakness: Secondary | ICD-10-CM | POA: Diagnosis not present

## 2015-12-10 DIAGNOSIS — N189 Chronic kidney disease, unspecified: Secondary | ICD-10-CM | POA: Diagnosis not present

## 2015-12-10 DIAGNOSIS — N390 Urinary tract infection, site not specified: Secondary | ICD-10-CM | POA: Diagnosis not present

## 2015-12-10 DIAGNOSIS — E871 Hypo-osmolality and hyponatremia: Secondary | ICD-10-CM | POA: Diagnosis not present

## 2015-12-10 DIAGNOSIS — F0390 Unspecified dementia without behavioral disturbance: Secondary | ICD-10-CM | POA: Diagnosis not present

## 2015-12-10 DIAGNOSIS — R5383 Other fatigue: Secondary | ICD-10-CM | POA: Diagnosis not present

## 2015-12-10 DIAGNOSIS — N289 Disorder of kidney and ureter, unspecified: Secondary | ICD-10-CM | POA: Diagnosis not present

## 2015-12-10 DIAGNOSIS — I10 Essential (primary) hypertension: Secondary | ICD-10-CM | POA: Diagnosis not present

## 2015-12-10 DIAGNOSIS — I712 Thoracic aortic aneurysm, without rupture: Secondary | ICD-10-CM | POA: Diagnosis not present

## 2015-12-10 DIAGNOSIS — G9341 Metabolic encephalopathy: Secondary | ICD-10-CM | POA: Diagnosis not present

## 2015-12-10 DIAGNOSIS — F0391 Unspecified dementia with behavioral disturbance: Secondary | ICD-10-CM | POA: Diagnosis not present

## 2015-12-10 DIAGNOSIS — Z952 Presence of prosthetic heart valve: Secondary | ICD-10-CM | POA: Diagnosis not present

## 2015-12-11 DIAGNOSIS — G9389 Other specified disorders of brain: Secondary | ICD-10-CM | POA: Diagnosis not present

## 2015-12-11 DIAGNOSIS — Z7982 Long term (current) use of aspirin: Secondary | ICD-10-CM | POA: Diagnosis not present

## 2015-12-11 DIAGNOSIS — I712 Thoracic aortic aneurysm, without rupture: Secondary | ICD-10-CM | POA: Diagnosis present

## 2015-12-11 DIAGNOSIS — G319 Degenerative disease of nervous system, unspecified: Secondary | ICD-10-CM | POA: Diagnosis not present

## 2015-12-11 DIAGNOSIS — Z79899 Other long term (current) drug therapy: Secondary | ICD-10-CM | POA: Diagnosis not present

## 2015-12-11 DIAGNOSIS — Z952 Presence of prosthetic heart valve: Secondary | ICD-10-CM | POA: Diagnosis not present

## 2015-12-11 DIAGNOSIS — G9341 Metabolic encephalopathy: Secondary | ICD-10-CM | POA: Diagnosis present

## 2015-12-11 DIAGNOSIS — I6782 Cerebral ischemia: Secondary | ICD-10-CM | POA: Diagnosis not present

## 2015-12-11 DIAGNOSIS — E871 Hypo-osmolality and hyponatremia: Secondary | ICD-10-CM | POA: Diagnosis present

## 2015-12-11 DIAGNOSIS — I10 Essential (primary) hypertension: Secondary | ICD-10-CM | POA: Diagnosis present

## 2015-12-11 DIAGNOSIS — N390 Urinary tract infection, site not specified: Secondary | ICD-10-CM | POA: Diagnosis not present

## 2015-12-11 DIAGNOSIS — F0391 Unspecified dementia with behavioral disturbance: Secondary | ICD-10-CM | POA: Diagnosis present

## 2015-12-11 DIAGNOSIS — N289 Disorder of kidney and ureter, unspecified: Secondary | ICD-10-CM | POA: Diagnosis present

## 2015-12-11 DIAGNOSIS — Z8673 Personal history of transient ischemic attack (TIA), and cerebral infarction without residual deficits: Secondary | ICD-10-CM | POA: Diagnosis not present

## 2015-12-16 DIAGNOSIS — Z8673 Personal history of transient ischemic attack (TIA), and cerebral infarction without residual deficits: Secondary | ICD-10-CM | POA: Diagnosis not present

## 2015-12-16 DIAGNOSIS — G930 Cerebral cysts: Secondary | ICD-10-CM | POA: Diagnosis not present

## 2015-12-16 DIAGNOSIS — I480 Paroxysmal atrial fibrillation: Secondary | ICD-10-CM | POA: Diagnosis not present

## 2015-12-16 DIAGNOSIS — M1990 Unspecified osteoarthritis, unspecified site: Secondary | ICD-10-CM | POA: Diagnosis not present

## 2015-12-16 DIAGNOSIS — I10 Essential (primary) hypertension: Secondary | ICD-10-CM | POA: Diagnosis not present

## 2015-12-16 DIAGNOSIS — Z66 Do not resuscitate: Secondary | ICD-10-CM | POA: Diagnosis not present

## 2015-12-16 DIAGNOSIS — I351 Nonrheumatic aortic (valve) insufficiency: Secondary | ICD-10-CM | POA: Diagnosis not present

## 2015-12-16 DIAGNOSIS — I6789 Other cerebrovascular disease: Secondary | ICD-10-CM | POA: Diagnosis not present

## 2015-12-16 DIAGNOSIS — R4189 Other symptoms and signs involving cognitive functions and awareness: Secondary | ICD-10-CM | POA: Diagnosis not present

## 2015-12-16 DIAGNOSIS — Z9181 History of falling: Secondary | ICD-10-CM | POA: Diagnosis not present

## 2015-12-16 DIAGNOSIS — F0390 Unspecified dementia without behavioral disturbance: Secondary | ICD-10-CM | POA: Diagnosis not present

## 2015-12-16 DIAGNOSIS — Z952 Presence of prosthetic heart valve: Secondary | ICD-10-CM | POA: Diagnosis not present

## 2015-12-16 DIAGNOSIS — I951 Orthostatic hypotension: Secondary | ICD-10-CM | POA: Diagnosis not present

## 2015-12-16 DIAGNOSIS — B029 Zoster without complications: Secondary | ICD-10-CM | POA: Diagnosis not present

## 2015-12-16 DIAGNOSIS — I639 Cerebral infarction, unspecified: Secondary | ICD-10-CM | POA: Diagnosis not present

## 2015-12-16 DIAGNOSIS — R4182 Altered mental status, unspecified: Secondary | ICD-10-CM | POA: Diagnosis not present

## 2015-12-16 DIAGNOSIS — R627 Adult failure to thrive: Secondary | ICD-10-CM | POA: Diagnosis not present

## 2015-12-17 DIAGNOSIS — I951 Orthostatic hypotension: Secondary | ICD-10-CM | POA: Diagnosis not present

## 2015-12-17 DIAGNOSIS — R4189 Other symptoms and signs involving cognitive functions and awareness: Secondary | ICD-10-CM | POA: Diagnosis not present

## 2015-12-17 DIAGNOSIS — Z952 Presence of prosthetic heart valve: Secondary | ICD-10-CM | POA: Diagnosis not present

## 2015-12-17 DIAGNOSIS — B029 Zoster without complications: Secondary | ICD-10-CM | POA: Diagnosis not present

## 2015-12-17 DIAGNOSIS — R55 Syncope and collapse: Secondary | ICD-10-CM | POA: Diagnosis not present

## 2015-12-18 DIAGNOSIS — B029 Zoster without complications: Secondary | ICD-10-CM | POA: Diagnosis not present

## 2015-12-18 DIAGNOSIS — R4189 Other symptoms and signs involving cognitive functions and awareness: Secondary | ICD-10-CM | POA: Diagnosis not present

## 2015-12-18 DIAGNOSIS — I951 Orthostatic hypotension: Secondary | ICD-10-CM | POA: Diagnosis not present

## 2015-12-18 DIAGNOSIS — Z952 Presence of prosthetic heart valve: Secondary | ICD-10-CM | POA: Diagnosis not present

## 2015-12-18 DIAGNOSIS — R55 Syncope and collapse: Secondary | ICD-10-CM | POA: Diagnosis not present

## 2015-12-22 DIAGNOSIS — I130 Hypertensive heart and chronic kidney disease with heart failure and stage 1 through stage 4 chronic kidney disease, or unspecified chronic kidney disease: Secondary | ICD-10-CM | POA: Diagnosis not present

## 2015-12-22 DIAGNOSIS — I69354 Hemiplegia and hemiparesis following cerebral infarction affecting left non-dominant side: Secondary | ICD-10-CM | POA: Diagnosis not present

## 2015-12-22 DIAGNOSIS — I509 Heart failure, unspecified: Secondary | ICD-10-CM | POA: Diagnosis not present

## 2015-12-22 DIAGNOSIS — Z87440 Personal history of urinary (tract) infections: Secondary | ICD-10-CM | POA: Diagnosis not present

## 2015-12-22 DIAGNOSIS — I2510 Atherosclerotic heart disease of native coronary artery without angina pectoris: Secondary | ICD-10-CM | POA: Diagnosis not present

## 2015-12-22 DIAGNOSIS — I951 Orthostatic hypotension: Secondary | ICD-10-CM | POA: Diagnosis not present

## 2015-12-27 DIAGNOSIS — I2510 Atherosclerotic heart disease of native coronary artery without angina pectoris: Secondary | ICD-10-CM | POA: Diagnosis not present

## 2015-12-27 DIAGNOSIS — Z87440 Personal history of urinary (tract) infections: Secondary | ICD-10-CM | POA: Diagnosis not present

## 2015-12-27 DIAGNOSIS — I69354 Hemiplegia and hemiparesis following cerebral infarction affecting left non-dominant side: Secondary | ICD-10-CM | POA: Diagnosis not present

## 2015-12-27 DIAGNOSIS — I130 Hypertensive heart and chronic kidney disease with heart failure and stage 1 through stage 4 chronic kidney disease, or unspecified chronic kidney disease: Secondary | ICD-10-CM | POA: Diagnosis not present

## 2015-12-27 DIAGNOSIS — I509 Heart failure, unspecified: Secondary | ICD-10-CM | POA: Diagnosis not present

## 2015-12-27 DIAGNOSIS — I951 Orthostatic hypotension: Secondary | ICD-10-CM | POA: Diagnosis not present

## 2015-12-30 DIAGNOSIS — I509 Heart failure, unspecified: Secondary | ICD-10-CM | POA: Diagnosis not present

## 2015-12-30 DIAGNOSIS — I69354 Hemiplegia and hemiparesis following cerebral infarction affecting left non-dominant side: Secondary | ICD-10-CM | POA: Diagnosis not present

## 2015-12-30 DIAGNOSIS — I951 Orthostatic hypotension: Secondary | ICD-10-CM | POA: Diagnosis not present

## 2015-12-30 DIAGNOSIS — I130 Hypertensive heart and chronic kidney disease with heart failure and stage 1 through stage 4 chronic kidney disease, or unspecified chronic kidney disease: Secondary | ICD-10-CM | POA: Diagnosis not present

## 2015-12-30 DIAGNOSIS — Z87440 Personal history of urinary (tract) infections: Secondary | ICD-10-CM | POA: Diagnosis not present

## 2015-12-30 DIAGNOSIS — I2510 Atherosclerotic heart disease of native coronary artery without angina pectoris: Secondary | ICD-10-CM | POA: Diagnosis not present

## 2016-01-03 DIAGNOSIS — I69354 Hemiplegia and hemiparesis following cerebral infarction affecting left non-dominant side: Secondary | ICD-10-CM | POA: Diagnosis not present

## 2016-01-03 DIAGNOSIS — Z87440 Personal history of urinary (tract) infections: Secondary | ICD-10-CM | POA: Diagnosis not present

## 2016-01-03 DIAGNOSIS — I951 Orthostatic hypotension: Secondary | ICD-10-CM | POA: Diagnosis not present

## 2016-01-03 DIAGNOSIS — I509 Heart failure, unspecified: Secondary | ICD-10-CM | POA: Diagnosis not present

## 2016-01-03 DIAGNOSIS — I2510 Atherosclerotic heart disease of native coronary artery without angina pectoris: Secondary | ICD-10-CM | POA: Diagnosis not present

## 2016-01-03 DIAGNOSIS — I130 Hypertensive heart and chronic kidney disease with heart failure and stage 1 through stage 4 chronic kidney disease, or unspecified chronic kidney disease: Secondary | ICD-10-CM | POA: Diagnosis not present

## 2016-01-06 DIAGNOSIS — I2510 Atherosclerotic heart disease of native coronary artery without angina pectoris: Secondary | ICD-10-CM | POA: Diagnosis not present

## 2016-01-06 DIAGNOSIS — I509 Heart failure, unspecified: Secondary | ICD-10-CM | POA: Diagnosis not present

## 2016-01-06 DIAGNOSIS — Z87440 Personal history of urinary (tract) infections: Secondary | ICD-10-CM | POA: Diagnosis not present

## 2016-01-06 DIAGNOSIS — I951 Orthostatic hypotension: Secondary | ICD-10-CM | POA: Diagnosis not present

## 2016-01-06 DIAGNOSIS — I130 Hypertensive heart and chronic kidney disease with heart failure and stage 1 through stage 4 chronic kidney disease, or unspecified chronic kidney disease: Secondary | ICD-10-CM | POA: Diagnosis not present

## 2016-01-06 DIAGNOSIS — I69354 Hemiplegia and hemiparesis following cerebral infarction affecting left non-dominant side: Secondary | ICD-10-CM | POA: Diagnosis not present

## 2016-01-07 DIAGNOSIS — I2510 Atherosclerotic heart disease of native coronary artery without angina pectoris: Secondary | ICD-10-CM | POA: Diagnosis not present

## 2016-01-07 DIAGNOSIS — I509 Heart failure, unspecified: Secondary | ICD-10-CM | POA: Diagnosis not present

## 2016-01-07 DIAGNOSIS — I130 Hypertensive heart and chronic kidney disease with heart failure and stage 1 through stage 4 chronic kidney disease, or unspecified chronic kidney disease: Secondary | ICD-10-CM | POA: Diagnosis not present

## 2016-01-07 DIAGNOSIS — I69354 Hemiplegia and hemiparesis following cerebral infarction affecting left non-dominant side: Secondary | ICD-10-CM | POA: Diagnosis not present

## 2016-01-07 DIAGNOSIS — I951 Orthostatic hypotension: Secondary | ICD-10-CM | POA: Diagnosis not present

## 2016-01-07 DIAGNOSIS — Z87440 Personal history of urinary (tract) infections: Secondary | ICD-10-CM | POA: Diagnosis not present

## 2016-01-10 DIAGNOSIS — I951 Orthostatic hypotension: Secondary | ICD-10-CM | POA: Diagnosis not present

## 2016-01-10 DIAGNOSIS — Z87440 Personal history of urinary (tract) infections: Secondary | ICD-10-CM | POA: Diagnosis not present

## 2016-01-10 DIAGNOSIS — I69354 Hemiplegia and hemiparesis following cerebral infarction affecting left non-dominant side: Secondary | ICD-10-CM | POA: Diagnosis not present

## 2016-01-10 DIAGNOSIS — I130 Hypertensive heart and chronic kidney disease with heart failure and stage 1 through stage 4 chronic kidney disease, or unspecified chronic kidney disease: Secondary | ICD-10-CM | POA: Diagnosis not present

## 2016-01-10 DIAGNOSIS — I509 Heart failure, unspecified: Secondary | ICD-10-CM | POA: Diagnosis not present

## 2016-01-10 DIAGNOSIS — I2510 Atherosclerotic heart disease of native coronary artery without angina pectoris: Secondary | ICD-10-CM | POA: Diagnosis not present

## 2016-01-12 DIAGNOSIS — I2510 Atherosclerotic heart disease of native coronary artery without angina pectoris: Secondary | ICD-10-CM | POA: Diagnosis not present

## 2016-01-12 DIAGNOSIS — I130 Hypertensive heart and chronic kidney disease with heart failure and stage 1 through stage 4 chronic kidney disease, or unspecified chronic kidney disease: Secondary | ICD-10-CM | POA: Diagnosis not present

## 2016-01-12 DIAGNOSIS — I69354 Hemiplegia and hemiparesis following cerebral infarction affecting left non-dominant side: Secondary | ICD-10-CM | POA: Diagnosis not present

## 2016-01-12 DIAGNOSIS — I509 Heart failure, unspecified: Secondary | ICD-10-CM | POA: Diagnosis not present

## 2016-01-12 DIAGNOSIS — I951 Orthostatic hypotension: Secondary | ICD-10-CM | POA: Diagnosis not present

## 2016-01-12 DIAGNOSIS — Z87440 Personal history of urinary (tract) infections: Secondary | ICD-10-CM | POA: Diagnosis not present

## 2016-01-18 DIAGNOSIS — I130 Hypertensive heart and chronic kidney disease with heart failure and stage 1 through stage 4 chronic kidney disease, or unspecified chronic kidney disease: Secondary | ICD-10-CM | POA: Diagnosis not present

## 2016-01-18 DIAGNOSIS — I509 Heart failure, unspecified: Secondary | ICD-10-CM | POA: Diagnosis not present

## 2016-01-18 DIAGNOSIS — I951 Orthostatic hypotension: Secondary | ICD-10-CM | POA: Diagnosis not present

## 2016-01-18 DIAGNOSIS — I69354 Hemiplegia and hemiparesis following cerebral infarction affecting left non-dominant side: Secondary | ICD-10-CM | POA: Diagnosis not present

## 2016-01-18 DIAGNOSIS — Z87440 Personal history of urinary (tract) infections: Secondary | ICD-10-CM | POA: Diagnosis not present

## 2016-01-18 DIAGNOSIS — I2510 Atherosclerotic heart disease of native coronary artery without angina pectoris: Secondary | ICD-10-CM | POA: Diagnosis not present

## 2016-01-20 DIAGNOSIS — Z87440 Personal history of urinary (tract) infections: Secondary | ICD-10-CM | POA: Diagnosis not present

## 2016-01-20 DIAGNOSIS — I130 Hypertensive heart and chronic kidney disease with heart failure and stage 1 through stage 4 chronic kidney disease, or unspecified chronic kidney disease: Secondary | ICD-10-CM | POA: Diagnosis not present

## 2016-01-20 DIAGNOSIS — I951 Orthostatic hypotension: Secondary | ICD-10-CM | POA: Diagnosis not present

## 2016-01-20 DIAGNOSIS — I509 Heart failure, unspecified: Secondary | ICD-10-CM | POA: Diagnosis not present

## 2016-01-20 DIAGNOSIS — I2510 Atherosclerotic heart disease of native coronary artery without angina pectoris: Secondary | ICD-10-CM | POA: Diagnosis not present

## 2016-01-20 DIAGNOSIS — I69354 Hemiplegia and hemiparesis following cerebral infarction affecting left non-dominant side: Secondary | ICD-10-CM | POA: Diagnosis not present

## 2016-01-22 DIAGNOSIS — I509 Heart failure, unspecified: Secondary | ICD-10-CM | POA: Diagnosis not present

## 2016-01-22 DIAGNOSIS — I69354 Hemiplegia and hemiparesis following cerebral infarction affecting left non-dominant side: Secondary | ICD-10-CM | POA: Diagnosis not present

## 2016-01-22 DIAGNOSIS — I951 Orthostatic hypotension: Secondary | ICD-10-CM | POA: Diagnosis not present

## 2016-01-22 DIAGNOSIS — Z87440 Personal history of urinary (tract) infections: Secondary | ICD-10-CM | POA: Diagnosis not present

## 2016-01-22 DIAGNOSIS — I2510 Atherosclerotic heart disease of native coronary artery without angina pectoris: Secondary | ICD-10-CM | POA: Diagnosis not present

## 2016-01-22 DIAGNOSIS — I130 Hypertensive heart and chronic kidney disease with heart failure and stage 1 through stage 4 chronic kidney disease, or unspecified chronic kidney disease: Secondary | ICD-10-CM | POA: Diagnosis not present

## 2016-01-26 DIAGNOSIS — I69354 Hemiplegia and hemiparesis following cerebral infarction affecting left non-dominant side: Secondary | ICD-10-CM | POA: Diagnosis not present

## 2016-01-26 DIAGNOSIS — Z87440 Personal history of urinary (tract) infections: Secondary | ICD-10-CM | POA: Diagnosis not present

## 2016-01-26 DIAGNOSIS — I509 Heart failure, unspecified: Secondary | ICD-10-CM | POA: Diagnosis not present

## 2016-01-26 DIAGNOSIS — I951 Orthostatic hypotension: Secondary | ICD-10-CM | POA: Diagnosis not present

## 2016-01-26 DIAGNOSIS — I2510 Atherosclerotic heart disease of native coronary artery without angina pectoris: Secondary | ICD-10-CM | POA: Diagnosis not present

## 2016-01-26 DIAGNOSIS — I130 Hypertensive heart and chronic kidney disease with heart failure and stage 1 through stage 4 chronic kidney disease, or unspecified chronic kidney disease: Secondary | ICD-10-CM | POA: Diagnosis not present

## 2016-01-28 DIAGNOSIS — Z87440 Personal history of urinary (tract) infections: Secondary | ICD-10-CM | POA: Diagnosis not present

## 2016-01-28 DIAGNOSIS — I69354 Hemiplegia and hemiparesis following cerebral infarction affecting left non-dominant side: Secondary | ICD-10-CM | POA: Diagnosis not present

## 2016-01-28 DIAGNOSIS — I951 Orthostatic hypotension: Secondary | ICD-10-CM | POA: Diagnosis not present

## 2016-01-28 DIAGNOSIS — I130 Hypertensive heart and chronic kidney disease with heart failure and stage 1 through stage 4 chronic kidney disease, or unspecified chronic kidney disease: Secondary | ICD-10-CM | POA: Diagnosis not present

## 2016-01-28 DIAGNOSIS — I509 Heart failure, unspecified: Secondary | ICD-10-CM | POA: Diagnosis not present

## 2016-01-28 DIAGNOSIS — I2510 Atherosclerotic heart disease of native coronary artery without angina pectoris: Secondary | ICD-10-CM | POA: Diagnosis not present

## 2016-02-03 DIAGNOSIS — I509 Heart failure, unspecified: Secondary | ICD-10-CM | POA: Diagnosis not present

## 2016-02-03 DIAGNOSIS — I69354 Hemiplegia and hemiparesis following cerebral infarction affecting left non-dominant side: Secondary | ICD-10-CM | POA: Diagnosis not present

## 2016-02-03 DIAGNOSIS — I951 Orthostatic hypotension: Secondary | ICD-10-CM | POA: Diagnosis not present

## 2016-02-03 DIAGNOSIS — I2510 Atherosclerotic heart disease of native coronary artery without angina pectoris: Secondary | ICD-10-CM | POA: Diagnosis not present

## 2016-02-03 DIAGNOSIS — I130 Hypertensive heart and chronic kidney disease with heart failure and stage 1 through stage 4 chronic kidney disease, or unspecified chronic kidney disease: Secondary | ICD-10-CM | POA: Diagnosis not present

## 2016-02-03 DIAGNOSIS — Z87440 Personal history of urinary (tract) infections: Secondary | ICD-10-CM | POA: Diagnosis not present

## 2016-02-04 DIAGNOSIS — Z87440 Personal history of urinary (tract) infections: Secondary | ICD-10-CM | POA: Diagnosis not present

## 2016-02-04 DIAGNOSIS — I509 Heart failure, unspecified: Secondary | ICD-10-CM | POA: Diagnosis not present

## 2016-02-04 DIAGNOSIS — I2510 Atherosclerotic heart disease of native coronary artery without angina pectoris: Secondary | ICD-10-CM | POA: Diagnosis not present

## 2016-02-04 DIAGNOSIS — I69354 Hemiplegia and hemiparesis following cerebral infarction affecting left non-dominant side: Secondary | ICD-10-CM | POA: Diagnosis not present

## 2016-02-04 DIAGNOSIS — I951 Orthostatic hypotension: Secondary | ICD-10-CM | POA: Diagnosis not present

## 2016-02-04 DIAGNOSIS — I130 Hypertensive heart and chronic kidney disease with heart failure and stage 1 through stage 4 chronic kidney disease, or unspecified chronic kidney disease: Secondary | ICD-10-CM | POA: Diagnosis not present

## 2016-02-09 DIAGNOSIS — I69354 Hemiplegia and hemiparesis following cerebral infarction affecting left non-dominant side: Secondary | ICD-10-CM | POA: Diagnosis not present

## 2016-02-09 DIAGNOSIS — I130 Hypertensive heart and chronic kidney disease with heart failure and stage 1 through stage 4 chronic kidney disease, or unspecified chronic kidney disease: Secondary | ICD-10-CM | POA: Diagnosis not present

## 2016-02-09 DIAGNOSIS — I509 Heart failure, unspecified: Secondary | ICD-10-CM | POA: Diagnosis not present

## 2016-02-09 DIAGNOSIS — Z87440 Personal history of urinary (tract) infections: Secondary | ICD-10-CM | POA: Diagnosis not present

## 2016-02-09 DIAGNOSIS — I2510 Atherosclerotic heart disease of native coronary artery without angina pectoris: Secondary | ICD-10-CM | POA: Diagnosis not present

## 2016-02-09 DIAGNOSIS — I951 Orthostatic hypotension: Secondary | ICD-10-CM | POA: Diagnosis not present

## 2016-02-10 DIAGNOSIS — I951 Orthostatic hypotension: Secondary | ICD-10-CM | POA: Diagnosis not present

## 2016-02-10 DIAGNOSIS — I130 Hypertensive heart and chronic kidney disease with heart failure and stage 1 through stage 4 chronic kidney disease, or unspecified chronic kidney disease: Secondary | ICD-10-CM | POA: Diagnosis not present

## 2016-02-10 DIAGNOSIS — I509 Heart failure, unspecified: Secondary | ICD-10-CM | POA: Diagnosis not present

## 2016-02-10 DIAGNOSIS — Z87440 Personal history of urinary (tract) infections: Secondary | ICD-10-CM | POA: Diagnosis not present

## 2016-02-10 DIAGNOSIS — I69354 Hemiplegia and hemiparesis following cerebral infarction affecting left non-dominant side: Secondary | ICD-10-CM | POA: Diagnosis not present

## 2016-02-10 DIAGNOSIS — I2510 Atherosclerotic heart disease of native coronary artery without angina pectoris: Secondary | ICD-10-CM | POA: Diagnosis not present

## 2016-02-11 DIAGNOSIS — R350 Frequency of micturition: Secondary | ICD-10-CM | POA: Diagnosis not present

## 2016-02-14 DIAGNOSIS — I130 Hypertensive heart and chronic kidney disease with heart failure and stage 1 through stage 4 chronic kidney disease, or unspecified chronic kidney disease: Secondary | ICD-10-CM | POA: Diagnosis not present

## 2016-02-14 DIAGNOSIS — Z87440 Personal history of urinary (tract) infections: Secondary | ICD-10-CM | POA: Diagnosis not present

## 2016-02-14 DIAGNOSIS — I951 Orthostatic hypotension: Secondary | ICD-10-CM | POA: Diagnosis not present

## 2016-02-14 DIAGNOSIS — I2510 Atherosclerotic heart disease of native coronary artery without angina pectoris: Secondary | ICD-10-CM | POA: Diagnosis not present

## 2016-02-14 DIAGNOSIS — I69354 Hemiplegia and hemiparesis following cerebral infarction affecting left non-dominant side: Secondary | ICD-10-CM | POA: Diagnosis not present

## 2016-02-14 DIAGNOSIS — I509 Heart failure, unspecified: Secondary | ICD-10-CM | POA: Diagnosis not present

## 2016-02-17 DIAGNOSIS — I2510 Atherosclerotic heart disease of native coronary artery without angina pectoris: Secondary | ICD-10-CM | POA: Diagnosis not present

## 2016-02-17 DIAGNOSIS — I509 Heart failure, unspecified: Secondary | ICD-10-CM | POA: Diagnosis not present

## 2016-02-17 DIAGNOSIS — I69354 Hemiplegia and hemiparesis following cerebral infarction affecting left non-dominant side: Secondary | ICD-10-CM | POA: Diagnosis not present

## 2016-02-17 DIAGNOSIS — I951 Orthostatic hypotension: Secondary | ICD-10-CM | POA: Diagnosis not present

## 2016-02-17 DIAGNOSIS — I130 Hypertensive heart and chronic kidney disease with heart failure and stage 1 through stage 4 chronic kidney disease, or unspecified chronic kidney disease: Secondary | ICD-10-CM | POA: Diagnosis not present

## 2016-02-17 DIAGNOSIS — Z87440 Personal history of urinary (tract) infections: Secondary | ICD-10-CM | POA: Diagnosis not present

## 2016-02-20 DIAGNOSIS — I951 Orthostatic hypotension: Secondary | ICD-10-CM | POA: Diagnosis not present

## 2016-02-20 DIAGNOSIS — I69354 Hemiplegia and hemiparesis following cerebral infarction affecting left non-dominant side: Secondary | ICD-10-CM | POA: Diagnosis not present

## 2016-02-20 DIAGNOSIS — F0390 Unspecified dementia without behavioral disturbance: Secondary | ICD-10-CM | POA: Diagnosis not present

## 2016-02-20 DIAGNOSIS — I509 Heart failure, unspecified: Secondary | ICD-10-CM | POA: Diagnosis not present

## 2016-02-20 DIAGNOSIS — I2510 Atherosclerotic heart disease of native coronary artery without angina pectoris: Secondary | ICD-10-CM | POA: Diagnosis not present

## 2016-02-20 DIAGNOSIS — I130 Hypertensive heart and chronic kidney disease with heart failure and stage 1 through stage 4 chronic kidney disease, or unspecified chronic kidney disease: Secondary | ICD-10-CM | POA: Diagnosis not present

## 2016-02-25 DIAGNOSIS — I2510 Atherosclerotic heart disease of native coronary artery without angina pectoris: Secondary | ICD-10-CM | POA: Diagnosis not present

## 2016-02-25 DIAGNOSIS — I130 Hypertensive heart and chronic kidney disease with heart failure and stage 1 through stage 4 chronic kidney disease, or unspecified chronic kidney disease: Secondary | ICD-10-CM | POA: Diagnosis not present

## 2016-02-25 DIAGNOSIS — I951 Orthostatic hypotension: Secondary | ICD-10-CM | POA: Diagnosis not present

## 2016-02-25 DIAGNOSIS — I69354 Hemiplegia and hemiparesis following cerebral infarction affecting left non-dominant side: Secondary | ICD-10-CM | POA: Diagnosis not present

## 2016-02-25 DIAGNOSIS — F0390 Unspecified dementia without behavioral disturbance: Secondary | ICD-10-CM | POA: Diagnosis not present

## 2016-02-25 DIAGNOSIS — I509 Heart failure, unspecified: Secondary | ICD-10-CM | POA: Diagnosis not present

## 2016-03-05 DIAGNOSIS — I509 Heart failure, unspecified: Secondary | ICD-10-CM | POA: Diagnosis not present

## 2016-03-05 DIAGNOSIS — I130 Hypertensive heart and chronic kidney disease with heart failure and stage 1 through stage 4 chronic kidney disease, or unspecified chronic kidney disease: Secondary | ICD-10-CM | POA: Diagnosis not present

## 2016-03-05 DIAGNOSIS — I951 Orthostatic hypotension: Secondary | ICD-10-CM | POA: Diagnosis not present

## 2016-03-05 DIAGNOSIS — F0390 Unspecified dementia without behavioral disturbance: Secondary | ICD-10-CM | POA: Diagnosis not present

## 2016-03-05 DIAGNOSIS — I69354 Hemiplegia and hemiparesis following cerebral infarction affecting left non-dominant side: Secondary | ICD-10-CM | POA: Diagnosis not present

## 2016-03-05 DIAGNOSIS — I2510 Atherosclerotic heart disease of native coronary artery without angina pectoris: Secondary | ICD-10-CM | POA: Diagnosis not present

## 2016-03-09 DIAGNOSIS — I69354 Hemiplegia and hemiparesis following cerebral infarction affecting left non-dominant side: Secondary | ICD-10-CM | POA: Diagnosis not present

## 2016-03-17 DIAGNOSIS — F0390 Unspecified dementia without behavioral disturbance: Secondary | ICD-10-CM | POA: Diagnosis not present

## 2016-03-17 DIAGNOSIS — I69354 Hemiplegia and hemiparesis following cerebral infarction affecting left non-dominant side: Secondary | ICD-10-CM | POA: Diagnosis not present

## 2016-03-17 DIAGNOSIS — I130 Hypertensive heart and chronic kidney disease with heart failure and stage 1 through stage 4 chronic kidney disease, or unspecified chronic kidney disease: Secondary | ICD-10-CM | POA: Diagnosis not present

## 2016-03-17 DIAGNOSIS — I2510 Atherosclerotic heart disease of native coronary artery without angina pectoris: Secondary | ICD-10-CM | POA: Diagnosis not present

## 2016-03-17 DIAGNOSIS — I509 Heart failure, unspecified: Secondary | ICD-10-CM | POA: Diagnosis not present

## 2016-03-17 DIAGNOSIS — I951 Orthostatic hypotension: Secondary | ICD-10-CM | POA: Diagnosis not present

## 2016-03-24 DIAGNOSIS — I69354 Hemiplegia and hemiparesis following cerebral infarction affecting left non-dominant side: Secondary | ICD-10-CM | POA: Diagnosis not present

## 2016-03-24 DIAGNOSIS — I951 Orthostatic hypotension: Secondary | ICD-10-CM | POA: Diagnosis not present

## 2016-03-24 DIAGNOSIS — I130 Hypertensive heart and chronic kidney disease with heart failure and stage 1 through stage 4 chronic kidney disease, or unspecified chronic kidney disease: Secondary | ICD-10-CM | POA: Diagnosis not present

## 2016-03-24 DIAGNOSIS — I509 Heart failure, unspecified: Secondary | ICD-10-CM | POA: Diagnosis not present

## 2016-03-24 DIAGNOSIS — I2510 Atherosclerotic heart disease of native coronary artery without angina pectoris: Secondary | ICD-10-CM | POA: Diagnosis not present

## 2016-03-24 DIAGNOSIS — F0390 Unspecified dementia without behavioral disturbance: Secondary | ICD-10-CM | POA: Diagnosis not present

## 2016-03-31 DIAGNOSIS — I69354 Hemiplegia and hemiparesis following cerebral infarction affecting left non-dominant side: Secondary | ICD-10-CM | POA: Diagnosis not present

## 2016-03-31 DIAGNOSIS — I130 Hypertensive heart and chronic kidney disease with heart failure and stage 1 through stage 4 chronic kidney disease, or unspecified chronic kidney disease: Secondary | ICD-10-CM | POA: Diagnosis not present

## 2016-03-31 DIAGNOSIS — I509 Heart failure, unspecified: Secondary | ICD-10-CM | POA: Diagnosis not present

## 2016-03-31 DIAGNOSIS — F0390 Unspecified dementia without behavioral disturbance: Secondary | ICD-10-CM | POA: Diagnosis not present

## 2016-03-31 DIAGNOSIS — I951 Orthostatic hypotension: Secondary | ICD-10-CM | POA: Diagnosis not present

## 2016-03-31 DIAGNOSIS — I2510 Atherosclerotic heart disease of native coronary artery without angina pectoris: Secondary | ICD-10-CM | POA: Diagnosis not present

## 2016-04-06 DIAGNOSIS — I951 Orthostatic hypotension: Secondary | ICD-10-CM | POA: Diagnosis not present

## 2016-04-06 DIAGNOSIS — I69354 Hemiplegia and hemiparesis following cerebral infarction affecting left non-dominant side: Secondary | ICD-10-CM | POA: Diagnosis not present

## 2016-04-06 DIAGNOSIS — F0390 Unspecified dementia without behavioral disturbance: Secondary | ICD-10-CM | POA: Diagnosis not present

## 2016-04-06 DIAGNOSIS — I2510 Atherosclerotic heart disease of native coronary artery without angina pectoris: Secondary | ICD-10-CM | POA: Diagnosis not present

## 2016-04-06 DIAGNOSIS — I509 Heart failure, unspecified: Secondary | ICD-10-CM | POA: Diagnosis not present

## 2016-04-06 DIAGNOSIS — I130 Hypertensive heart and chronic kidney disease with heart failure and stage 1 through stage 4 chronic kidney disease, or unspecified chronic kidney disease: Secondary | ICD-10-CM | POA: Diagnosis not present

## 2016-04-12 DIAGNOSIS — I69354 Hemiplegia and hemiparesis following cerebral infarction affecting left non-dominant side: Secondary | ICD-10-CM | POA: Diagnosis not present

## 2016-04-12 DIAGNOSIS — I130 Hypertensive heart and chronic kidney disease with heart failure and stage 1 through stage 4 chronic kidney disease, or unspecified chronic kidney disease: Secondary | ICD-10-CM | POA: Diagnosis not present

## 2016-04-12 DIAGNOSIS — I951 Orthostatic hypotension: Secondary | ICD-10-CM | POA: Diagnosis not present

## 2016-04-12 DIAGNOSIS — I509 Heart failure, unspecified: Secondary | ICD-10-CM | POA: Diagnosis not present

## 2016-04-12 DIAGNOSIS — F0390 Unspecified dementia without behavioral disturbance: Secondary | ICD-10-CM | POA: Diagnosis not present

## 2016-04-12 DIAGNOSIS — I2510 Atherosclerotic heart disease of native coronary artery without angina pectoris: Secondary | ICD-10-CM | POA: Diagnosis not present

## 2016-04-13 DIAGNOSIS — I2510 Atherosclerotic heart disease of native coronary artery without angina pectoris: Secondary | ICD-10-CM | POA: Diagnosis not present

## 2016-04-13 DIAGNOSIS — I130 Hypertensive heart and chronic kidney disease with heart failure and stage 1 through stage 4 chronic kidney disease, or unspecified chronic kidney disease: Secondary | ICD-10-CM | POA: Diagnosis not present

## 2016-04-13 DIAGNOSIS — I951 Orthostatic hypotension: Secondary | ICD-10-CM | POA: Diagnosis not present

## 2016-04-13 DIAGNOSIS — I69354 Hemiplegia and hemiparesis following cerebral infarction affecting left non-dominant side: Secondary | ICD-10-CM | POA: Diagnosis not present

## 2016-04-13 DIAGNOSIS — I509 Heart failure, unspecified: Secondary | ICD-10-CM | POA: Diagnosis not present

## 2016-04-13 DIAGNOSIS — F0390 Unspecified dementia without behavioral disturbance: Secondary | ICD-10-CM | POA: Diagnosis not present

## 2016-04-14 DIAGNOSIS — F0390 Unspecified dementia without behavioral disturbance: Secondary | ICD-10-CM | POA: Diagnosis not present

## 2016-04-14 DIAGNOSIS — I2510 Atherosclerotic heart disease of native coronary artery without angina pectoris: Secondary | ICD-10-CM | POA: Diagnosis not present

## 2016-04-14 DIAGNOSIS — I951 Orthostatic hypotension: Secondary | ICD-10-CM | POA: Diagnosis not present

## 2016-04-14 DIAGNOSIS — I69354 Hemiplegia and hemiparesis following cerebral infarction affecting left non-dominant side: Secondary | ICD-10-CM | POA: Diagnosis not present

## 2016-04-14 DIAGNOSIS — I130 Hypertensive heart and chronic kidney disease with heart failure and stage 1 through stage 4 chronic kidney disease, or unspecified chronic kidney disease: Secondary | ICD-10-CM | POA: Diagnosis not present

## 2016-04-14 DIAGNOSIS — I509 Heart failure, unspecified: Secondary | ICD-10-CM | POA: Diagnosis not present

## 2016-04-17 DIAGNOSIS — I69354 Hemiplegia and hemiparesis following cerebral infarction affecting left non-dominant side: Secondary | ICD-10-CM | POA: Diagnosis not present

## 2016-04-17 DIAGNOSIS — I951 Orthostatic hypotension: Secondary | ICD-10-CM | POA: Diagnosis not present

## 2016-04-17 DIAGNOSIS — F0390 Unspecified dementia without behavioral disturbance: Secondary | ICD-10-CM | POA: Diagnosis not present

## 2016-04-17 DIAGNOSIS — I509 Heart failure, unspecified: Secondary | ICD-10-CM | POA: Diagnosis not present

## 2016-04-17 DIAGNOSIS — I2510 Atherosclerotic heart disease of native coronary artery without angina pectoris: Secondary | ICD-10-CM | POA: Diagnosis not present

## 2016-04-17 DIAGNOSIS — I130 Hypertensive heart and chronic kidney disease with heart failure and stage 1 through stage 4 chronic kidney disease, or unspecified chronic kidney disease: Secondary | ICD-10-CM | POA: Diagnosis not present

## 2016-04-19 DIAGNOSIS — I509 Heart failure, unspecified: Secondary | ICD-10-CM | POA: Diagnosis not present

## 2016-04-19 DIAGNOSIS — F0390 Unspecified dementia without behavioral disturbance: Secondary | ICD-10-CM | POA: Diagnosis not present

## 2016-04-19 DIAGNOSIS — I2510 Atherosclerotic heart disease of native coronary artery without angina pectoris: Secondary | ICD-10-CM | POA: Diagnosis not present

## 2016-04-19 DIAGNOSIS — I951 Orthostatic hypotension: Secondary | ICD-10-CM | POA: Diagnosis not present

## 2016-04-19 DIAGNOSIS — I130 Hypertensive heart and chronic kidney disease with heart failure and stage 1 through stage 4 chronic kidney disease, or unspecified chronic kidney disease: Secondary | ICD-10-CM | POA: Diagnosis not present

## 2016-04-19 DIAGNOSIS — I69354 Hemiplegia and hemiparesis following cerebral infarction affecting left non-dominant side: Secondary | ICD-10-CM | POA: Diagnosis not present

## 2016-04-20 DIAGNOSIS — I2510 Atherosclerotic heart disease of native coronary artery without angina pectoris: Secondary | ICD-10-CM | POA: Diagnosis not present

## 2016-04-20 DIAGNOSIS — I130 Hypertensive heart and chronic kidney disease with heart failure and stage 1 through stage 4 chronic kidney disease, or unspecified chronic kidney disease: Secondary | ICD-10-CM | POA: Diagnosis not present

## 2016-04-20 DIAGNOSIS — I69354 Hemiplegia and hemiparesis following cerebral infarction affecting left non-dominant side: Secondary | ICD-10-CM | POA: Diagnosis not present

## 2016-04-20 DIAGNOSIS — F0390 Unspecified dementia without behavioral disturbance: Secondary | ICD-10-CM | POA: Diagnosis not present

## 2016-04-20 DIAGNOSIS — I951 Orthostatic hypotension: Secondary | ICD-10-CM | POA: Diagnosis not present

## 2016-04-20 DIAGNOSIS — I509 Heart failure, unspecified: Secondary | ICD-10-CM | POA: Diagnosis not present

## 2016-04-25 DIAGNOSIS — I130 Hypertensive heart and chronic kidney disease with heart failure and stage 1 through stage 4 chronic kidney disease, or unspecified chronic kidney disease: Secondary | ICD-10-CM | POA: Diagnosis not present

## 2016-04-25 DIAGNOSIS — I69354 Hemiplegia and hemiparesis following cerebral infarction affecting left non-dominant side: Secondary | ICD-10-CM | POA: Diagnosis not present

## 2016-04-25 DIAGNOSIS — I509 Heart failure, unspecified: Secondary | ICD-10-CM | POA: Diagnosis not present

## 2016-04-25 DIAGNOSIS — F0390 Unspecified dementia without behavioral disturbance: Secondary | ICD-10-CM | POA: Diagnosis not present

## 2016-04-25 DIAGNOSIS — I951 Orthostatic hypotension: Secondary | ICD-10-CM | POA: Diagnosis not present

## 2016-04-25 DIAGNOSIS — I2510 Atherosclerotic heart disease of native coronary artery without angina pectoris: Secondary | ICD-10-CM | POA: Diagnosis not present

## 2016-04-26 DIAGNOSIS — F0390 Unspecified dementia without behavioral disturbance: Secondary | ICD-10-CM | POA: Diagnosis not present

## 2016-04-26 DIAGNOSIS — I130 Hypertensive heart and chronic kidney disease with heart failure and stage 1 through stage 4 chronic kidney disease, or unspecified chronic kidney disease: Secondary | ICD-10-CM | POA: Diagnosis not present

## 2016-04-26 DIAGNOSIS — I951 Orthostatic hypotension: Secondary | ICD-10-CM | POA: Diagnosis not present

## 2016-04-26 DIAGNOSIS — I509 Heart failure, unspecified: Secondary | ICD-10-CM | POA: Diagnosis not present

## 2016-04-26 DIAGNOSIS — I2510 Atherosclerotic heart disease of native coronary artery without angina pectoris: Secondary | ICD-10-CM | POA: Diagnosis not present

## 2016-04-26 DIAGNOSIS — I69354 Hemiplegia and hemiparesis following cerebral infarction affecting left non-dominant side: Secondary | ICD-10-CM | POA: Diagnosis not present

## 2016-04-27 DIAGNOSIS — F0390 Unspecified dementia without behavioral disturbance: Secondary | ICD-10-CM | POA: Diagnosis not present

## 2016-04-27 DIAGNOSIS — I130 Hypertensive heart and chronic kidney disease with heart failure and stage 1 through stage 4 chronic kidney disease, or unspecified chronic kidney disease: Secondary | ICD-10-CM | POA: Diagnosis not present

## 2016-04-27 DIAGNOSIS — I69354 Hemiplegia and hemiparesis following cerebral infarction affecting left non-dominant side: Secondary | ICD-10-CM | POA: Diagnosis not present

## 2016-04-27 DIAGNOSIS — I951 Orthostatic hypotension: Secondary | ICD-10-CM | POA: Diagnosis not present

## 2016-04-27 DIAGNOSIS — I509 Heart failure, unspecified: Secondary | ICD-10-CM | POA: Diagnosis not present

## 2016-04-27 DIAGNOSIS — I2510 Atherosclerotic heart disease of native coronary artery without angina pectoris: Secondary | ICD-10-CM | POA: Diagnosis not present

## 2016-05-03 DIAGNOSIS — I2510 Atherosclerotic heart disease of native coronary artery without angina pectoris: Secondary | ICD-10-CM | POA: Diagnosis not present

## 2016-05-03 DIAGNOSIS — I951 Orthostatic hypotension: Secondary | ICD-10-CM | POA: Diagnosis not present

## 2016-05-03 DIAGNOSIS — F0390 Unspecified dementia without behavioral disturbance: Secondary | ICD-10-CM | POA: Diagnosis not present

## 2016-05-03 DIAGNOSIS — I130 Hypertensive heart and chronic kidney disease with heart failure and stage 1 through stage 4 chronic kidney disease, or unspecified chronic kidney disease: Secondary | ICD-10-CM | POA: Diagnosis not present

## 2016-05-03 DIAGNOSIS — I69354 Hemiplegia and hemiparesis following cerebral infarction affecting left non-dominant side: Secondary | ICD-10-CM | POA: Diagnosis not present

## 2016-05-03 DIAGNOSIS — I509 Heart failure, unspecified: Secondary | ICD-10-CM | POA: Diagnosis not present

## 2016-05-05 DIAGNOSIS — I509 Heart failure, unspecified: Secondary | ICD-10-CM | POA: Diagnosis not present

## 2016-05-05 DIAGNOSIS — I2510 Atherosclerotic heart disease of native coronary artery without angina pectoris: Secondary | ICD-10-CM | POA: Diagnosis not present

## 2016-05-05 DIAGNOSIS — F0390 Unspecified dementia without behavioral disturbance: Secondary | ICD-10-CM | POA: Diagnosis not present

## 2016-05-05 DIAGNOSIS — I69354 Hemiplegia and hemiparesis following cerebral infarction affecting left non-dominant side: Secondary | ICD-10-CM | POA: Diagnosis not present

## 2016-05-05 DIAGNOSIS — I951 Orthostatic hypotension: Secondary | ICD-10-CM | POA: Diagnosis not present

## 2016-05-05 DIAGNOSIS — I130 Hypertensive heart and chronic kidney disease with heart failure and stage 1 through stage 4 chronic kidney disease, or unspecified chronic kidney disease: Secondary | ICD-10-CM | POA: Diagnosis not present

## 2016-05-10 DIAGNOSIS — I951 Orthostatic hypotension: Secondary | ICD-10-CM | POA: Diagnosis not present

## 2016-05-10 DIAGNOSIS — I2510 Atherosclerotic heart disease of native coronary artery without angina pectoris: Secondary | ICD-10-CM | POA: Diagnosis not present

## 2016-05-10 DIAGNOSIS — F0390 Unspecified dementia without behavioral disturbance: Secondary | ICD-10-CM | POA: Diagnosis not present

## 2016-05-10 DIAGNOSIS — I509 Heart failure, unspecified: Secondary | ICD-10-CM | POA: Diagnosis not present

## 2016-05-10 DIAGNOSIS — I130 Hypertensive heart and chronic kidney disease with heart failure and stage 1 through stage 4 chronic kidney disease, or unspecified chronic kidney disease: Secondary | ICD-10-CM | POA: Diagnosis not present

## 2016-05-10 DIAGNOSIS — I69354 Hemiplegia and hemiparesis following cerebral infarction affecting left non-dominant side: Secondary | ICD-10-CM | POA: Diagnosis not present

## 2016-05-12 DIAGNOSIS — I69354 Hemiplegia and hemiparesis following cerebral infarction affecting left non-dominant side: Secondary | ICD-10-CM | POA: Diagnosis not present

## 2016-05-12 DIAGNOSIS — I951 Orthostatic hypotension: Secondary | ICD-10-CM | POA: Diagnosis not present

## 2016-05-12 DIAGNOSIS — I130 Hypertensive heart and chronic kidney disease with heart failure and stage 1 through stage 4 chronic kidney disease, or unspecified chronic kidney disease: Secondary | ICD-10-CM | POA: Diagnosis not present

## 2016-05-12 DIAGNOSIS — F0390 Unspecified dementia without behavioral disturbance: Secondary | ICD-10-CM | POA: Diagnosis not present

## 2016-05-12 DIAGNOSIS — I2510 Atherosclerotic heart disease of native coronary artery without angina pectoris: Secondary | ICD-10-CM | POA: Diagnosis not present

## 2016-05-12 DIAGNOSIS — I509 Heart failure, unspecified: Secondary | ICD-10-CM | POA: Diagnosis not present

## 2016-05-17 DIAGNOSIS — F0390 Unspecified dementia without behavioral disturbance: Secondary | ICD-10-CM | POA: Diagnosis not present

## 2016-05-17 DIAGNOSIS — I69354 Hemiplegia and hemiparesis following cerebral infarction affecting left non-dominant side: Secondary | ICD-10-CM | POA: Diagnosis not present

## 2016-05-17 DIAGNOSIS — I951 Orthostatic hypotension: Secondary | ICD-10-CM | POA: Diagnosis not present

## 2016-05-17 DIAGNOSIS — I509 Heart failure, unspecified: Secondary | ICD-10-CM | POA: Diagnosis not present

## 2016-05-17 DIAGNOSIS — I130 Hypertensive heart and chronic kidney disease with heart failure and stage 1 through stage 4 chronic kidney disease, or unspecified chronic kidney disease: Secondary | ICD-10-CM | POA: Diagnosis not present

## 2016-05-17 DIAGNOSIS — I2510 Atherosclerotic heart disease of native coronary artery without angina pectoris: Secondary | ICD-10-CM | POA: Diagnosis not present

## 2016-05-20 DIAGNOSIS — I509 Heart failure, unspecified: Secondary | ICD-10-CM | POA: Diagnosis not present

## 2016-05-20 DIAGNOSIS — F0390 Unspecified dementia without behavioral disturbance: Secondary | ICD-10-CM | POA: Diagnosis not present

## 2016-05-20 DIAGNOSIS — I2510 Atherosclerotic heart disease of native coronary artery without angina pectoris: Secondary | ICD-10-CM | POA: Diagnosis not present

## 2016-05-20 DIAGNOSIS — I130 Hypertensive heart and chronic kidney disease with heart failure and stage 1 through stage 4 chronic kidney disease, or unspecified chronic kidney disease: Secondary | ICD-10-CM | POA: Diagnosis not present

## 2016-05-20 DIAGNOSIS — I69354 Hemiplegia and hemiparesis following cerebral infarction affecting left non-dominant side: Secondary | ICD-10-CM | POA: Diagnosis not present

## 2016-05-20 DIAGNOSIS — I951 Orthostatic hypotension: Secondary | ICD-10-CM | POA: Diagnosis not present

## 2016-05-24 DIAGNOSIS — F0390 Unspecified dementia without behavioral disturbance: Secondary | ICD-10-CM | POA: Diagnosis not present

## 2016-05-24 DIAGNOSIS — I951 Orthostatic hypotension: Secondary | ICD-10-CM | POA: Diagnosis not present

## 2016-05-24 DIAGNOSIS — I2510 Atherosclerotic heart disease of native coronary artery without angina pectoris: Secondary | ICD-10-CM | POA: Diagnosis not present

## 2016-05-24 DIAGNOSIS — I69354 Hemiplegia and hemiparesis following cerebral infarction affecting left non-dominant side: Secondary | ICD-10-CM | POA: Diagnosis not present

## 2016-05-24 DIAGNOSIS — I509 Heart failure, unspecified: Secondary | ICD-10-CM | POA: Diagnosis not present

## 2016-05-24 DIAGNOSIS — I130 Hypertensive heart and chronic kidney disease with heart failure and stage 1 through stage 4 chronic kidney disease, or unspecified chronic kidney disease: Secondary | ICD-10-CM | POA: Diagnosis not present

## 2016-05-25 DIAGNOSIS — I2510 Atherosclerotic heart disease of native coronary artery without angina pectoris: Secondary | ICD-10-CM | POA: Diagnosis not present

## 2016-05-25 DIAGNOSIS — F0390 Unspecified dementia without behavioral disturbance: Secondary | ICD-10-CM | POA: Diagnosis not present

## 2016-05-25 DIAGNOSIS — I130 Hypertensive heart and chronic kidney disease with heart failure and stage 1 through stage 4 chronic kidney disease, or unspecified chronic kidney disease: Secondary | ICD-10-CM | POA: Diagnosis not present

## 2016-05-25 DIAGNOSIS — I69354 Hemiplegia and hemiparesis following cerebral infarction affecting left non-dominant side: Secondary | ICD-10-CM | POA: Diagnosis not present

## 2016-05-25 DIAGNOSIS — I951 Orthostatic hypotension: Secondary | ICD-10-CM | POA: Diagnosis not present

## 2016-05-25 DIAGNOSIS — I509 Heart failure, unspecified: Secondary | ICD-10-CM | POA: Diagnosis not present

## 2016-05-31 DIAGNOSIS — I2510 Atherosclerotic heart disease of native coronary artery without angina pectoris: Secondary | ICD-10-CM | POA: Diagnosis not present

## 2016-05-31 DIAGNOSIS — I951 Orthostatic hypotension: Secondary | ICD-10-CM | POA: Diagnosis not present

## 2016-05-31 DIAGNOSIS — F0390 Unspecified dementia without behavioral disturbance: Secondary | ICD-10-CM | POA: Diagnosis not present

## 2016-05-31 DIAGNOSIS — I509 Heart failure, unspecified: Secondary | ICD-10-CM | POA: Diagnosis not present

## 2016-05-31 DIAGNOSIS — I130 Hypertensive heart and chronic kidney disease with heart failure and stage 1 through stage 4 chronic kidney disease, or unspecified chronic kidney disease: Secondary | ICD-10-CM | POA: Diagnosis not present

## 2016-05-31 DIAGNOSIS — I69354 Hemiplegia and hemiparesis following cerebral infarction affecting left non-dominant side: Secondary | ICD-10-CM | POA: Diagnosis not present

## 2016-06-02 DIAGNOSIS — I951 Orthostatic hypotension: Secondary | ICD-10-CM | POA: Diagnosis not present

## 2016-06-02 DIAGNOSIS — I69354 Hemiplegia and hemiparesis following cerebral infarction affecting left non-dominant side: Secondary | ICD-10-CM | POA: Diagnosis not present

## 2016-06-02 DIAGNOSIS — I2510 Atherosclerotic heart disease of native coronary artery without angina pectoris: Secondary | ICD-10-CM | POA: Diagnosis not present

## 2016-06-02 DIAGNOSIS — F0390 Unspecified dementia without behavioral disturbance: Secondary | ICD-10-CM | POA: Diagnosis not present

## 2016-06-02 DIAGNOSIS — I509 Heart failure, unspecified: Secondary | ICD-10-CM | POA: Diagnosis not present

## 2016-06-02 DIAGNOSIS — I130 Hypertensive heart and chronic kidney disease with heart failure and stage 1 through stage 4 chronic kidney disease, or unspecified chronic kidney disease: Secondary | ICD-10-CM | POA: Diagnosis not present

## 2016-06-03 DIAGNOSIS — R410 Disorientation, unspecified: Secondary | ICD-10-CM | POA: Diagnosis not present

## 2016-06-05 DIAGNOSIS — I951 Orthostatic hypotension: Secondary | ICD-10-CM | POA: Diagnosis not present

## 2016-06-05 DIAGNOSIS — F0390 Unspecified dementia without behavioral disturbance: Secondary | ICD-10-CM | POA: Diagnosis not present

## 2016-06-05 DIAGNOSIS — I130 Hypertensive heart and chronic kidney disease with heart failure and stage 1 through stage 4 chronic kidney disease, or unspecified chronic kidney disease: Secondary | ICD-10-CM | POA: Diagnosis not present

## 2016-06-05 DIAGNOSIS — I509 Heart failure, unspecified: Secondary | ICD-10-CM | POA: Diagnosis not present

## 2016-06-05 DIAGNOSIS — I2510 Atherosclerotic heart disease of native coronary artery without angina pectoris: Secondary | ICD-10-CM | POA: Diagnosis not present

## 2016-06-05 DIAGNOSIS — I69354 Hemiplegia and hemiparesis following cerebral infarction affecting left non-dominant side: Secondary | ICD-10-CM | POA: Diagnosis not present

## 2016-06-09 DIAGNOSIS — I2510 Atherosclerotic heart disease of native coronary artery without angina pectoris: Secondary | ICD-10-CM | POA: Diagnosis not present

## 2016-06-09 DIAGNOSIS — I69354 Hemiplegia and hemiparesis following cerebral infarction affecting left non-dominant side: Secondary | ICD-10-CM | POA: Diagnosis not present

## 2016-06-09 DIAGNOSIS — I951 Orthostatic hypotension: Secondary | ICD-10-CM | POA: Diagnosis not present

## 2016-06-09 DIAGNOSIS — I509 Heart failure, unspecified: Secondary | ICD-10-CM | POA: Diagnosis not present

## 2016-06-09 DIAGNOSIS — I130 Hypertensive heart and chronic kidney disease with heart failure and stage 1 through stage 4 chronic kidney disease, or unspecified chronic kidney disease: Secondary | ICD-10-CM | POA: Diagnosis not present

## 2016-06-09 DIAGNOSIS — F0390 Unspecified dementia without behavioral disturbance: Secondary | ICD-10-CM | POA: Diagnosis not present

## 2016-06-15 DIAGNOSIS — I509 Heart failure, unspecified: Secondary | ICD-10-CM | POA: Diagnosis not present

## 2016-06-15 DIAGNOSIS — I951 Orthostatic hypotension: Secondary | ICD-10-CM | POA: Diagnosis not present

## 2016-06-15 DIAGNOSIS — F0390 Unspecified dementia without behavioral disturbance: Secondary | ICD-10-CM | POA: Diagnosis not present

## 2016-06-15 DIAGNOSIS — I69354 Hemiplegia and hemiparesis following cerebral infarction affecting left non-dominant side: Secondary | ICD-10-CM | POA: Diagnosis not present

## 2016-06-15 DIAGNOSIS — I2510 Atherosclerotic heart disease of native coronary artery without angina pectoris: Secondary | ICD-10-CM | POA: Diagnosis not present

## 2016-06-15 DIAGNOSIS — I130 Hypertensive heart and chronic kidney disease with heart failure and stage 1 through stage 4 chronic kidney disease, or unspecified chronic kidney disease: Secondary | ICD-10-CM | POA: Diagnosis not present

## 2016-08-31 IMAGING — CT CT ANGIO CHEST
2 of 6 series · 19 of 36 positions shown · IV contrast (Omnipaque 300)
Comparison: CT scan of June 24, 2010.

CLINICAL DATA: Left-sided chest pain.  Elevated D-dimer level.

EXAM:
CT ANGIOGRAPHY CHEST WITH CONTRAST
TECHNIQUE: Multidetector CT imaging of the chest was performed using the
standard protocol during bolus administration of intravenous
contrast. Multiplanar CT image reconstructions and MIPs were
obtained to evaluate the vascular anatomy.
CONTRAST:  75mL OMNIPAQUE IOHEXOL 350 MG/ML SOLN

[Series 5: thins 1.0mm / 0.8mm · axial · 0.63mm/px · z∈[-284,-28]mm · 18 of 286 slices shown]
[im 15/286  lung]
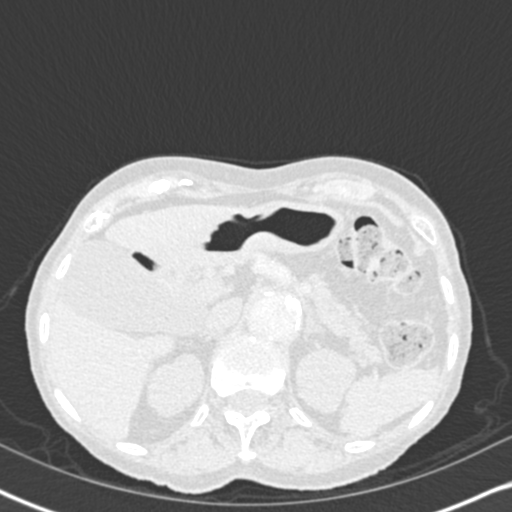
[im 29/286  mediastinal]
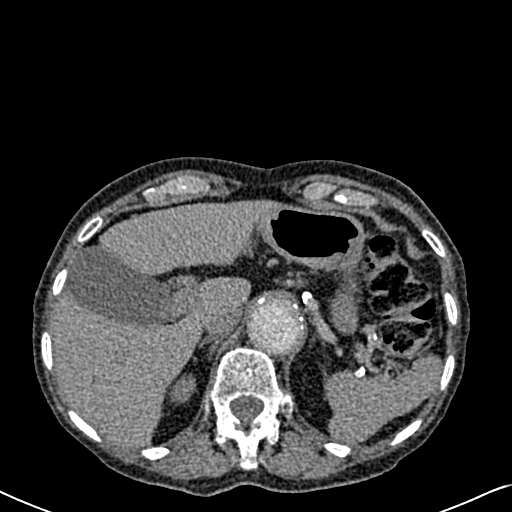
[im 43/286  lung]
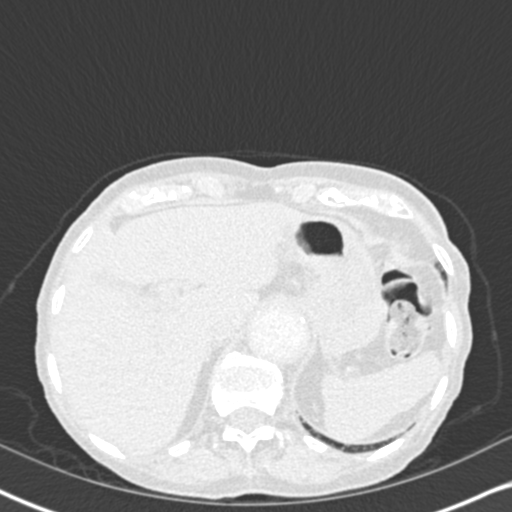
[im 58/286  mediastinal]
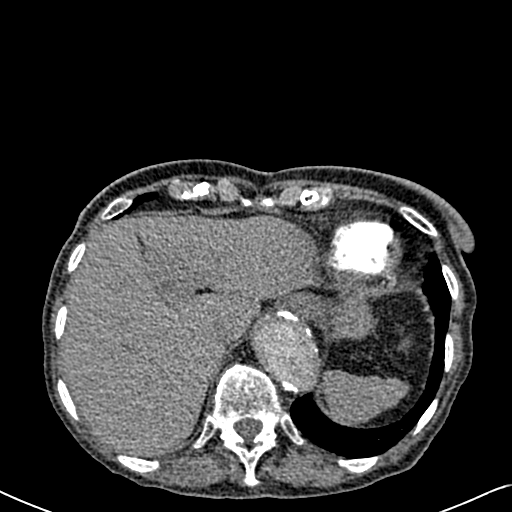
[im 72/286  lung]
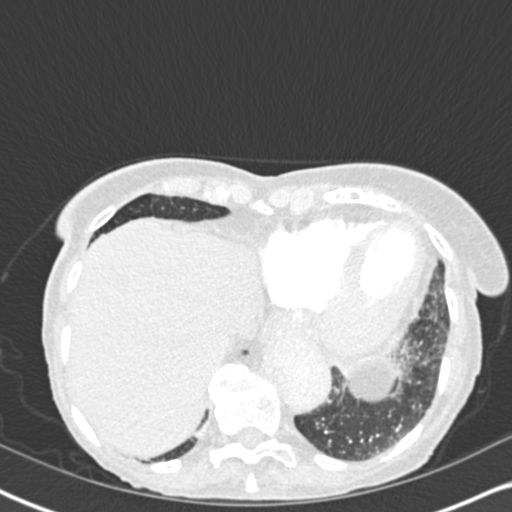
[im 86/286  mediastinal]
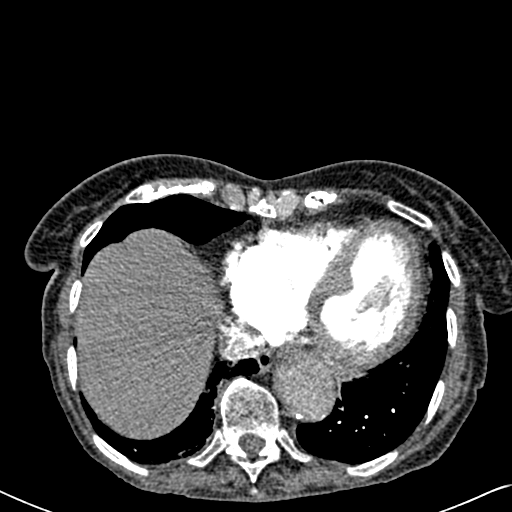
[im 100/286  lung]
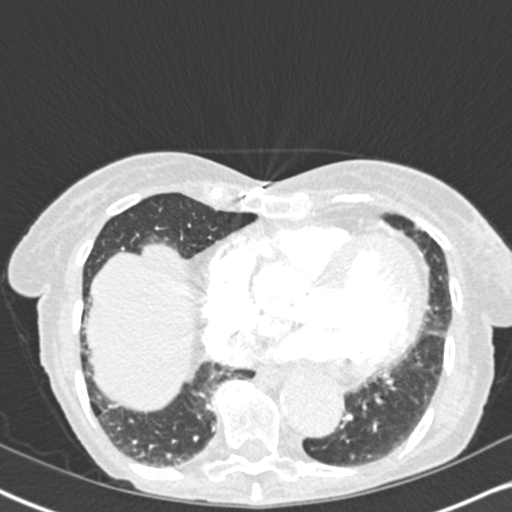
[im 115/286  mediastinal]
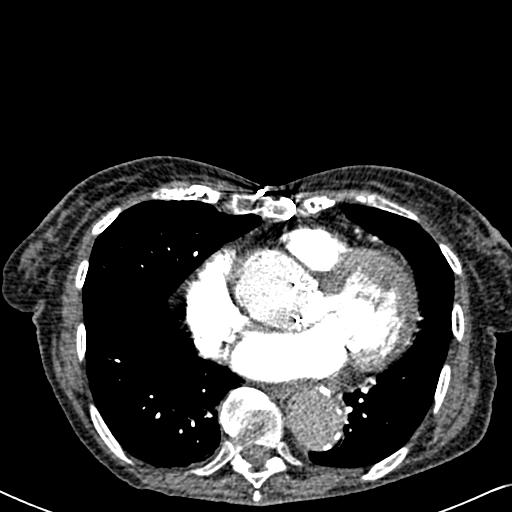
[im 129/286  lung]
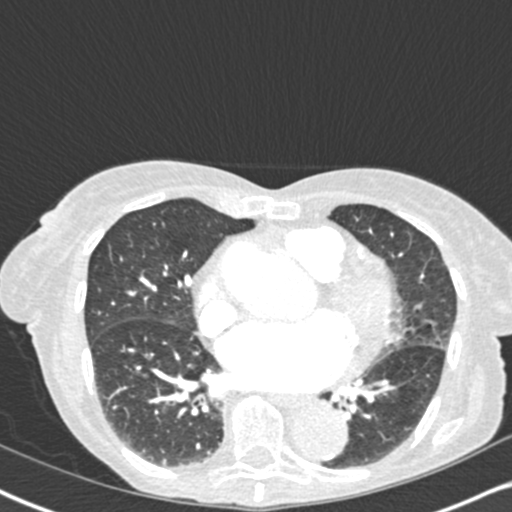
[im 157/286  mediastinal]
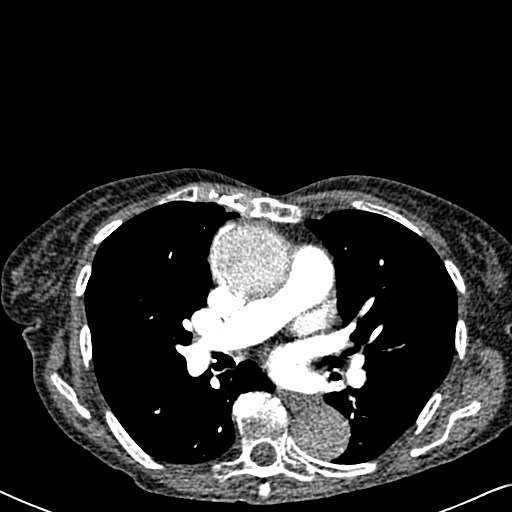
[im 172/286  lung]
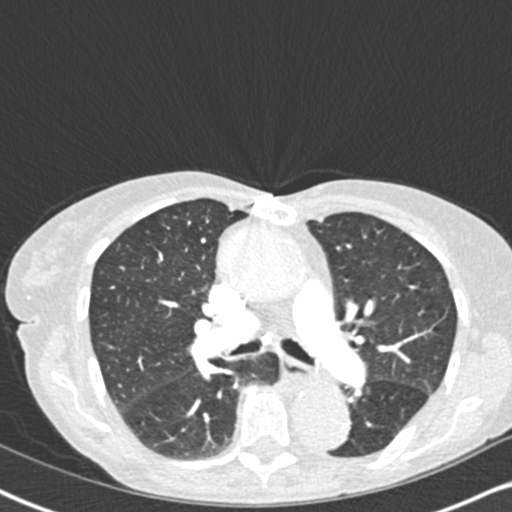
[im 186/286  mediastinal]
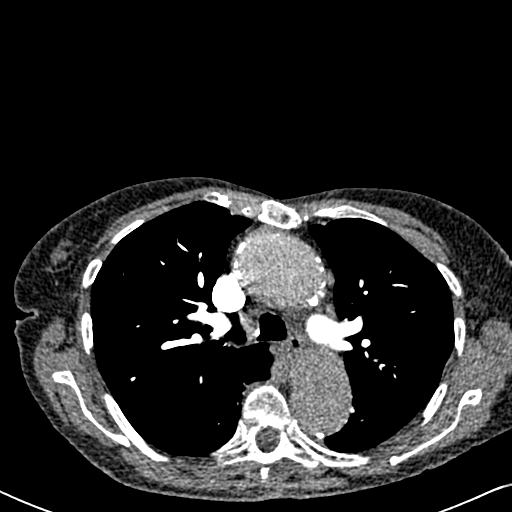
[im 200/286  lung]
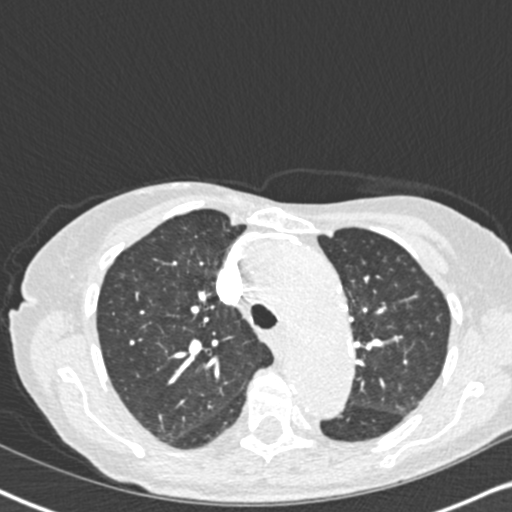
[im 214/286  mediastinal]
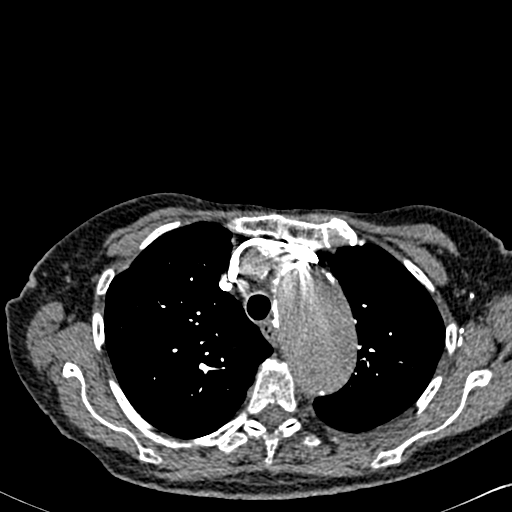
[im 229/286  lung]
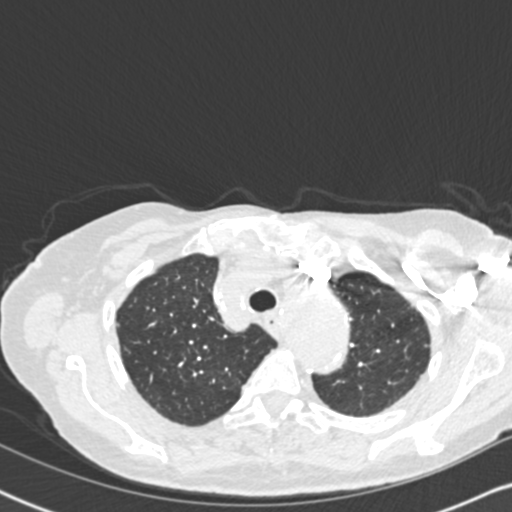
[im 243/286  mediastinal]
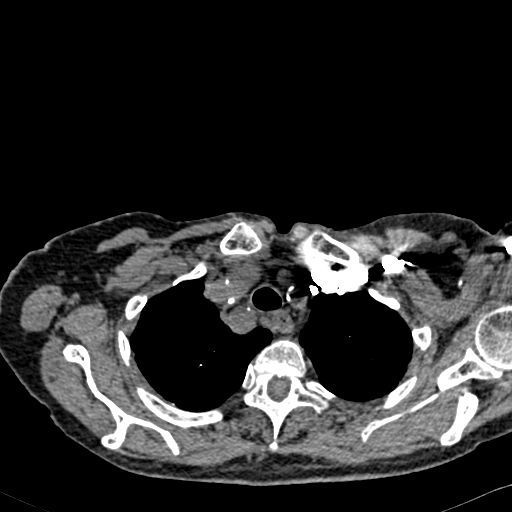
[im 257/286  lung]
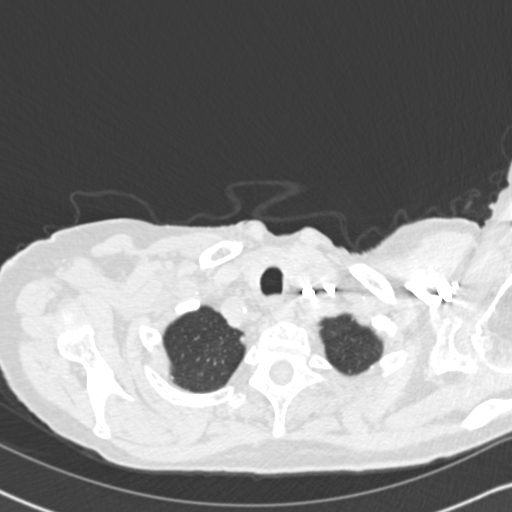
[im 271/286  mediastinal]
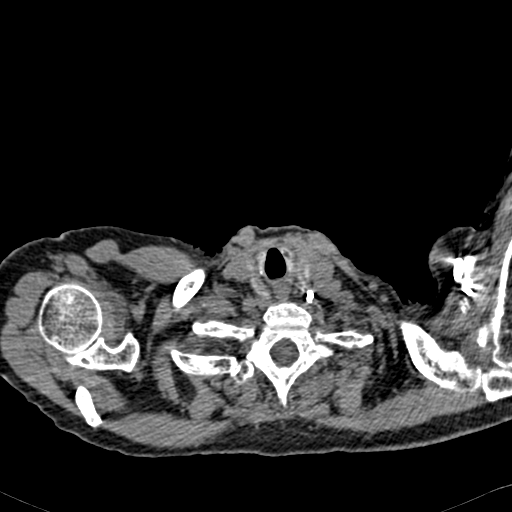

[Series 602: cor · coronal · 0.63mm/px · 1 of 100 slices shown]
[im 50/100  mediastinal]
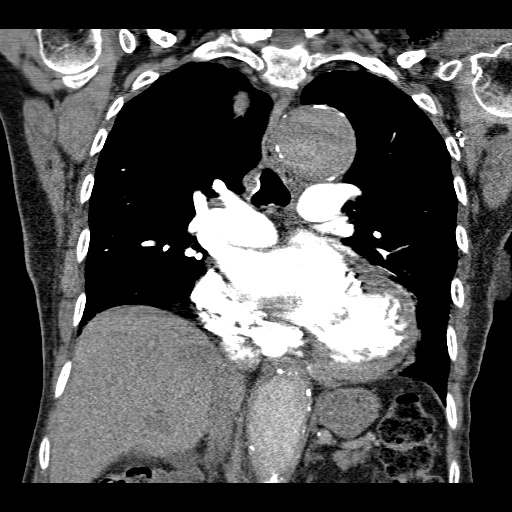

[19 of 36 positions shown; findings below may reference images not displayed]

FINDINGS: No pneumothorax or pleural effusion is noted. No acute pulmonary
disease is noted. There is no evidence of pulmonary embolus.
Aneurysmal dilatation of ascending thoracic aorta is noted with
maximum measured diameter 4.9 cm which is not significantly changed
compared to prior exam. There is also noted aneurysmal dilatation of
the transverse aortic arch with maximum measured diameter of 5.0 cm,
which is significantly increased compared to prior exam. Also noted
is dilatation of descending thoracic aorta with maximum measured
diameter 3.5 cm which is increased compared to prior exam. No
mediastinal mass or adenopathy is noted. Visualized portion of upper
abdomen appears normal. Sternotomy wires are noted.

Review of the MIP images confirms the above findings.
IMPRESSION: No evidence of pulmonary embolus.

Aneurysmal dilatation of ascending thoracic aorta is noted with
maximum measured diameter of 4.9 cm which is not significantly
changed compared to prior exam. Aneurysmal dilatation of transverse
aortic arch is also noted with maximum measured diameter of 5.0 cm,
which is significantly increased compared to prior exam of [DATE].

## 2016-10-23 DIAGNOSIS — R319 Hematuria, unspecified: Secondary | ICD-10-CM | POA: Diagnosis not present

## 2016-10-24 DIAGNOSIS — S52021A Displaced fracture of olecranon process without intraarticular extension of right ulna, initial encounter for closed fracture: Secondary | ICD-10-CM | POA: Diagnosis not present

## 2016-10-24 DIAGNOSIS — S6991XA Unspecified injury of right wrist, hand and finger(s), initial encounter: Secondary | ICD-10-CM | POA: Diagnosis not present

## 2016-10-24 DIAGNOSIS — S42401A Unspecified fracture of lower end of right humerus, initial encounter for closed fracture: Secondary | ICD-10-CM | POA: Diagnosis not present

## 2016-10-24 DIAGNOSIS — M25531 Pain in right wrist: Secondary | ICD-10-CM | POA: Diagnosis not present

## 2016-10-24 DIAGNOSIS — S5291XA Unspecified fracture of right forearm, initial encounter for closed fracture: Secondary | ICD-10-CM | POA: Diagnosis not present

## 2016-10-24 DIAGNOSIS — S59911A Unspecified injury of right forearm, initial encounter: Secondary | ICD-10-CM | POA: Diagnosis not present

## 2016-10-24 DIAGNOSIS — M25421 Effusion, right elbow: Secondary | ICD-10-CM | POA: Diagnosis not present

## 2016-10-24 DIAGNOSIS — S59901A Unspecified injury of right elbow, initial encounter: Secondary | ICD-10-CM | POA: Diagnosis not present

## 2016-10-31 DIAGNOSIS — S52021A Displaced fracture of olecranon process without intraarticular extension of right ulna, initial encounter for closed fracture: Secondary | ICD-10-CM | POA: Diagnosis not present

## 2016-12-01 DIAGNOSIS — S52021A Displaced fracture of olecranon process without intraarticular extension of right ulna, initial encounter for closed fracture: Secondary | ICD-10-CM | POA: Diagnosis not present

## 2016-12-01 DIAGNOSIS — Z23 Encounter for immunization: Secondary | ICD-10-CM | POA: Diagnosis not present

## 2016-12-15 DIAGNOSIS — R309 Painful micturition, unspecified: Secondary | ICD-10-CM | POA: Diagnosis not present

## 2016-12-26 DIAGNOSIS — S52021A Displaced fracture of olecranon process without intraarticular extension of right ulna, initial encounter for closed fracture: Secondary | ICD-10-CM | POA: Diagnosis not present

## 2017-04-10 DIAGNOSIS — F0390 Unspecified dementia without behavioral disturbance: Secondary | ICD-10-CM | POA: Diagnosis not present

## 2017-04-10 DIAGNOSIS — I951 Orthostatic hypotension: Secondary | ICD-10-CM | POA: Diagnosis not present

## 2017-04-10 DIAGNOSIS — R1310 Dysphagia, unspecified: Secondary | ICD-10-CM | POA: Diagnosis not present

## 2017-04-10 DIAGNOSIS — R634 Abnormal weight loss: Secondary | ICD-10-CM | POA: Diagnosis not present

## 2017-04-11 DIAGNOSIS — R8290 Unspecified abnormal findings in urine: Secondary | ICD-10-CM | POA: Diagnosis not present

## 2017-04-11 DIAGNOSIS — R634 Abnormal weight loss: Secondary | ICD-10-CM | POA: Diagnosis not present

## 2017-04-15 DIAGNOSIS — R42 Dizziness and giddiness: Secondary | ICD-10-CM | POA: Diagnosis not present

## 2017-04-15 DIAGNOSIS — J111 Influenza due to unidentified influenza virus with other respiratory manifestations: Secondary | ICD-10-CM | POA: Diagnosis not present

## 2017-04-15 DIAGNOSIS — J101 Influenza due to other identified influenza virus with other respiratory manifestations: Secondary | ICD-10-CM | POA: Diagnosis not present

## 2017-04-15 DIAGNOSIS — E860 Dehydration: Secondary | ICD-10-CM | POA: Diagnosis not present

## 2017-04-15 DIAGNOSIS — F0390 Unspecified dementia without behavioral disturbance: Secondary | ICD-10-CM | POA: Diagnosis not present

## 2017-04-15 DIAGNOSIS — R079 Chest pain, unspecified: Secondary | ICD-10-CM | POA: Diagnosis not present

## 2017-04-15 DIAGNOSIS — I712 Thoracic aortic aneurysm, without rupture: Secondary | ICD-10-CM | POA: Diagnosis not present

## 2017-04-15 DIAGNOSIS — D7589 Other specified diseases of blood and blood-forming organs: Secondary | ICD-10-CM | POA: Diagnosis not present

## 2017-04-15 DIAGNOSIS — R531 Weakness: Secondary | ICD-10-CM | POA: Diagnosis not present

## 2017-04-15 DIAGNOSIS — J09X2 Influenza due to identified novel influenza A virus with other respiratory manifestations: Secondary | ICD-10-CM | POA: Diagnosis not present

## 2017-04-15 DIAGNOSIS — G9341 Metabolic encephalopathy: Secondary | ICD-10-CM | POA: Diagnosis not present

## 2017-04-16 DIAGNOSIS — G9341 Metabolic encephalopathy: Secondary | ICD-10-CM | POA: Diagnosis present

## 2017-04-16 DIAGNOSIS — F0390 Unspecified dementia without behavioral disturbance: Secondary | ICD-10-CM | POA: Diagnosis present

## 2017-04-16 DIAGNOSIS — Z953 Presence of xenogenic heart valve: Secondary | ICD-10-CM | POA: Diagnosis not present

## 2017-04-16 DIAGNOSIS — J111 Influenza due to unidentified influenza virus with other respiratory manifestations: Secondary | ICD-10-CM | POA: Diagnosis not present

## 2017-04-16 DIAGNOSIS — Z87440 Personal history of urinary (tract) infections: Secondary | ICD-10-CM | POA: Diagnosis not present

## 2017-04-16 DIAGNOSIS — D7589 Other specified diseases of blood and blood-forming organs: Secondary | ICD-10-CM | POA: Diagnosis present

## 2017-04-16 DIAGNOSIS — E860 Dehydration: Secondary | ICD-10-CM | POA: Diagnosis present

## 2017-04-16 DIAGNOSIS — R7989 Other specified abnormal findings of blood chemistry: Secondary | ICD-10-CM | POA: Diagnosis not present

## 2017-04-16 DIAGNOSIS — Z9221 Personal history of antineoplastic chemotherapy: Secondary | ICD-10-CM | POA: Diagnosis not present

## 2017-04-16 DIAGNOSIS — I712 Thoracic aortic aneurysm, without rupture: Secondary | ICD-10-CM | POA: Diagnosis present

## 2017-04-16 DIAGNOSIS — Z7982 Long term (current) use of aspirin: Secondary | ICD-10-CM | POA: Diagnosis not present

## 2017-04-16 DIAGNOSIS — Z66 Do not resuscitate: Secondary | ICD-10-CM | POA: Diagnosis present

## 2017-04-16 DIAGNOSIS — I10 Essential (primary) hypertension: Secondary | ICD-10-CM | POA: Diagnosis present

## 2017-04-16 DIAGNOSIS — Z8572 Personal history of non-Hodgkin lymphomas: Secondary | ICD-10-CM | POA: Diagnosis not present

## 2017-04-16 DIAGNOSIS — J101 Influenza due to other identified influenza virus with other respiratory manifestations: Secondary | ICD-10-CM | POA: Diagnosis present

## 2017-04-16 DIAGNOSIS — Z23 Encounter for immunization: Secondary | ICD-10-CM | POA: Diagnosis not present

## 2017-04-16 DIAGNOSIS — Z8673 Personal history of transient ischemic attack (TIA), and cerebral infarction without residual deficits: Secondary | ICD-10-CM | POA: Diagnosis not present

## 2017-06-08 DIAGNOSIS — I2510 Atherosclerotic heart disease of native coronary artery without angina pectoris: Secondary | ICD-10-CM | POA: Diagnosis not present

## 2017-06-08 DIAGNOSIS — F330 Major depressive disorder, recurrent, mild: Secondary | ICD-10-CM | POA: Diagnosis not present

## 2017-06-08 DIAGNOSIS — M160 Bilateral primary osteoarthritis of hip: Secondary | ICD-10-CM | POA: Diagnosis not present

## 2017-06-08 DIAGNOSIS — F0390 Unspecified dementia without behavioral disturbance: Secondary | ICD-10-CM | POA: Diagnosis not present

## 2017-06-08 DIAGNOSIS — C719 Malignant neoplasm of brain, unspecified: Secondary | ICD-10-CM | POA: Diagnosis not present

## 2017-06-08 DIAGNOSIS — I10 Essential (primary) hypertension: Secondary | ICD-10-CM | POA: Diagnosis not present

## 2017-08-10 DIAGNOSIS — I10 Essential (primary) hypertension: Secondary | ICD-10-CM | POA: Diagnosis not present

## 2017-08-10 DIAGNOSIS — M160 Bilateral primary osteoarthritis of hip: Secondary | ICD-10-CM | POA: Diagnosis not present

## 2017-08-10 DIAGNOSIS — F330 Major depressive disorder, recurrent, mild: Secondary | ICD-10-CM | POA: Diagnosis not present

## 2017-08-10 DIAGNOSIS — I2510 Atherosclerotic heart disease of native coronary artery without angina pectoris: Secondary | ICD-10-CM | POA: Diagnosis not present

## 2017-08-10 DIAGNOSIS — F0390 Unspecified dementia without behavioral disturbance: Secondary | ICD-10-CM | POA: Diagnosis not present

## 2017-08-10 DIAGNOSIS — C719 Malignant neoplasm of brain, unspecified: Secondary | ICD-10-CM | POA: Diagnosis not present

## 2017-08-30 DIAGNOSIS — N390 Urinary tract infection, site not specified: Secondary | ICD-10-CM | POA: Diagnosis not present

## 2017-08-30 DIAGNOSIS — D649 Anemia, unspecified: Secondary | ICD-10-CM | POA: Diagnosis not present

## 2018-07-24 DEATH — deceased
